# Patient Record
Sex: Female | Born: 1950
Health system: Southern US, Community
[De-identification: ages and names within clinical notes are randomized; demographics above are authoritative.]

## PROBLEM LIST (undated history)

## (undated) DIAGNOSIS — D369 Benign neoplasm, unspecified site: Secondary | ICD-10-CM

## (undated) DIAGNOSIS — I10 Essential (primary) hypertension: Secondary | ICD-10-CM

## (undated) DIAGNOSIS — R7303 Prediabetes: Secondary | ICD-10-CM

## (undated) DIAGNOSIS — E785 Hyperlipidemia, unspecified: Secondary | ICD-10-CM

## (undated) DIAGNOSIS — K5790 Diverticulosis of intestine, part unspecified, without perforation or abscess without bleeding: Secondary | ICD-10-CM

## (undated) DIAGNOSIS — F419 Anxiety disorder, unspecified: Secondary | ICD-10-CM

## (undated) DIAGNOSIS — M1990 Unspecified osteoarthritis, unspecified site: Secondary | ICD-10-CM

## (undated) DIAGNOSIS — M67432 Ganglion, left wrist: Secondary | ICD-10-CM

## (undated) DIAGNOSIS — H9319 Tinnitus, unspecified ear: Secondary | ICD-10-CM

## (undated) DIAGNOSIS — K219 Gastro-esophageal reflux disease without esophagitis: Secondary | ICD-10-CM

## (undated) DIAGNOSIS — T7840XA Allergy, unspecified, initial encounter: Secondary | ICD-10-CM

## (undated) HISTORY — PX: TONSILLECTOMY: SUR1361

## (undated) HISTORY — PX: OTHER SURGICAL HISTORY: SHX169

## (undated) HISTORY — DX: Anxiety disorder, unspecified: F41.9

## (undated) HISTORY — PX: COLONOSCOPY: SHX174

## (undated) HISTORY — DX: Ganglion, left wrist: M67.432

## (undated) HISTORY — DX: Unspecified osteoarthritis, unspecified site: M19.90

## (undated) HISTORY — DX: Benign neoplasm, unspecified site: D36.9

## (undated) HISTORY — PX: ABDOMINAL HYSTERECTOMY: SHX81

## (undated) HISTORY — PX: POLYPECTOMY: SHX149

## (undated) HISTORY — PX: TUBAL LIGATION: SHX77

## (undated) HISTORY — DX: Allergy, unspecified, initial encounter: T78.40XA

## (undated) HISTORY — DX: Tinnitus, unspecified ear: H93.19

## (undated) HISTORY — DX: Diverticulosis of intestine, part unspecified, without perforation or abscess without bleeding: K57.90

---

## 1999-11-02 ENCOUNTER — Other Ambulatory Visit: Admission: RE | Admit: 1999-11-02 | Discharge: 1999-11-02 | Payer: Self-pay | Admitting: Obstetrics and Gynecology

## 2000-07-13 ENCOUNTER — Encounter (INDEPENDENT_AMBULATORY_CARE_PROVIDER_SITE_OTHER): Payer: Self-pay | Admitting: Specialist

## 2000-07-13 ENCOUNTER — Other Ambulatory Visit: Admission: RE | Admit: 2000-07-13 | Discharge: 2000-07-13 | Payer: Self-pay | Admitting: Obstetrics and Gynecology

## 2001-03-06 ENCOUNTER — Other Ambulatory Visit: Admission: RE | Admit: 2001-03-06 | Discharge: 2001-03-06 | Payer: Self-pay | Admitting: Obstetrics and Gynecology

## 2002-05-15 ENCOUNTER — Other Ambulatory Visit: Admission: RE | Admit: 2002-05-15 | Discharge: 2002-05-15 | Payer: Self-pay | Admitting: Obstetrics and Gynecology

## 2003-05-19 ENCOUNTER — Other Ambulatory Visit: Admission: RE | Admit: 2003-05-19 | Discharge: 2003-05-19 | Payer: Self-pay | Admitting: Obstetrics and Gynecology

## 2004-06-28 ENCOUNTER — Other Ambulatory Visit: Admission: RE | Admit: 2004-06-28 | Discharge: 2004-06-28 | Payer: Self-pay | Admitting: Obstetrics and Gynecology

## 2007-09-28 ENCOUNTER — Ambulatory Visit: Payer: Self-pay | Admitting: Internal Medicine

## 2007-10-11 ENCOUNTER — Encounter: Payer: Self-pay | Admitting: Internal Medicine

## 2007-10-11 ENCOUNTER — Ambulatory Visit: Payer: Self-pay | Admitting: Internal Medicine

## 2009-11-17 ENCOUNTER — Encounter (INDEPENDENT_AMBULATORY_CARE_PROVIDER_SITE_OTHER): Payer: Self-pay | Admitting: *Deleted

## 2010-09-28 NOTE — Letter (Signed)
Summary: Colonoscopy Date Change Letter  [facility]  [address] [place], [place] [address]   Phone: [phone]  Fax: [phone]      November 17, 2009 MRN: [id number]   [facility] [address] [place], [place]  [address]   Dear Ms. ENCARNACION,   Previously you were recommended to have a repeat colonoscopy around this time. Your chart was recently reviewed by Dr. Hedwig Morton. Juanda Chance of [facility]. Follow up colonoscopy is now recommended in February 2016. This revised recommendation is based on current, nationally recognized guidelines for colorectal cancer screening and polyp surveillance. These guidelines are endorsed by the American Cancer Society, [employer] on Colorectal Cancer as well as numerous other major medical organizations.  Please understand that our recommendation assumes that you do not have any new symptoms such as bleeding, a change in bowel habits, anemia, or significant abdominal discomfort. If you do have any concerning GI symptoms or want to discuss the guideline recommendations, please call to arrange an office visit at your earliest convenience. Otherwise we will keep you in our reminder system and contact you 1-2 months prior to the date listed above to schedule your next colonoscopy.  Thank you,  Hedwig Morton. Juanda Chance, M.D.  [facility] [phone]

## 2012-11-29 ENCOUNTER — Emergency Department (HOSPITAL_COMMUNITY)
Admission: EM | Admit: 2012-11-29 | Discharge: 2012-11-29 | Disposition: A | Discharge status: Home / Self Care | Payer: BC Managed Care – PPO | Attending: Emergency Medicine | Admitting: Emergency Medicine

## 2012-11-29 ENCOUNTER — Encounter (HOSPITAL_COMMUNITY): Payer: Self-pay | Admitting: Emergency Medicine

## 2012-11-29 DIAGNOSIS — I10 Essential (primary) hypertension: Secondary | ICD-10-CM | POA: Insufficient documentation

## 2012-11-29 DIAGNOSIS — E871 Hypo-osmolality and hyponatremia: Secondary | ICD-10-CM | POA: Insufficient documentation

## 2012-11-29 DIAGNOSIS — Z79899 Other long term (current) drug therapy: Secondary | ICD-10-CM | POA: Insufficient documentation

## 2012-11-29 DIAGNOSIS — E785 Hyperlipidemia, unspecified: Secondary | ICD-10-CM | POA: Insufficient documentation

## 2012-11-29 HISTORY — DX: Essential (primary) hypertension: I10

## 2012-11-29 HISTORY — DX: Hyperlipidemia, unspecified: E78.5

## 2012-11-29 LAB — COMPREHENSIVE METABOLIC PANEL
ALT: 20 U/L (ref 0–35)
Albumin: 3.7 g/dL (ref 3.5–5.2)
Alkaline Phosphatase: 51 U/L (ref 39–117)
Calcium: 9.3 mg/dL (ref 8.4–10.5)
GFR calc Af Amer: >90 mL/min (ref >90)
Potassium: 3.4 mEq/L — ABNORMAL LOW (ref 3.5–5.1)
Sodium: 125 mEq/L — ABNORMAL LOW (ref 135–145)
Total Protein: 6.7 g/dL (ref 6.0–8.3)

## 2012-11-29 LAB — CBC WITH DIFFERENTIAL/PLATELET
Basophils Relative: 1 % (ref 0–1)
Eosinophils Absolute: 0.1 10*3/uL (ref 0.0–0.7)
Eosinophils Relative: 1 % (ref 0–5)
MCH: 30.3 pg (ref 26.0–34.0)
MCHC: 35.7 g/dL (ref 30.0–36.0)
MCV: 85.1 fL (ref 78.0–100.0)
Neutrophils Relative %: 69 % (ref 43–77)
Platelets: 267 10*3/uL (ref 150–400)
RDW: 12.1 % (ref 11.5–15.5)

## 2012-11-29 MED ORDER — SODIUM CHLORIDE 0.9 % IV BOLUS (SEPSIS)
1000.0000 mL | Freq: Once | INTRAVENOUS | Status: AC
  Administered 2012-11-29: 1000 mL via INTRAVENOUS

## 2012-11-29 NOTE — ED Provider Notes (Signed)
History     CSN: [id number]  Arrival date & time 11/29/12  1357   First MD Initiated Contact with Patient 11/29/12 1408      Chief Complaint  Patient presents with  . abnormal labs    (Consider location/radiation/quality/duration/timing/severity/associated sxs/prior treatment) HPI Comments: Pt comes in with cc of abnl labs. Pt has hx of HTN. States that her PCP called and requested her to come to the ER for abnormal sodium. Pt has no AMS/confusion, weakness, seizures. She denies any hx of endocrine abnormality, increased water intake. She is on HCTZ.  The history is provided by the patient.    Past Medical History  Diagnosis Date  . Hypertension   . Hyperlipidemia     History reviewed. No pertinent past surgical history.  No family history on file.  History  Substance Use Topics  . Smoking status: Never Smoker   . Smokeless tobacco: Not on file  . Alcohol Use: No    OB History   Grav Para Term Preterm Abortions TAB SAB Ect Mult Living                  Review of Systems  Constitutional: Negative for activity change.  HENT: Negative for neck pain.   Respiratory: Negative for shortness of breath.   Cardiovascular: Negative for chest pain.  Gastrointestinal: Negative for nausea, vomiting and abdominal pain.  Genitourinary: Negative for dysuria.  Neurological: Negative for headaches.    Allergies  Review of patient's allergies indicates no known allergies.  Home Medications   Current Outpatient Rx  Name  Route  Sig  Dispense  Refill  . calcium-vitamin D (OSCAL WITH D) 500-200 MG-UNIT per tablet   Oral   Take 1 tablet by mouth daily.         . fish oil-omega-3 fatty acids 1000 MG capsule   Oral   Take 2 g by mouth daily.         . hydrochlorothiazide (HYDRODIURIL) 25 MG tablet   Oral   Take 25 mg by mouth every morning.         Marland Kitchen ibuprofen (ADVIL,MOTRIN) 200 MG tablet   Oral   Take 200-400 mg by mouth every 6 (six) hours as needed for pain.         Marland Kitchen lisinopril (PRINIVIL,ZESTRIL) 10 MG tablet   Oral   Take 5 mg by mouth every morning. Takes 1/2         . Melatonin 3 MG TABS   Oral   Take 1 tablet by mouth at bedtime as needed (sleep).         . Multiple Vitamin (MULTIVITAMIN WITH MINERALS) TABS   Oral   Take 1 tablet by mouth daily.         . pseudoephedrine (SUDAFED) 60 MG tablet   Oral   Take 60 mg by mouth every 4 (four) hours as needed for congestion.         . rosuvastatin (CRESTOR) 10 MG tablet   Oral   Take 10 mg by mouth every evening.         . Vitamins-Lipotropics (LIPOFLAVONOID PO)   Oral   Take 1 tablet by mouth 2 (two) times daily.           BP 151/81  Pulse 94  Temp(Src) 98.7 F (37.1 C) (Oral)  Resp 20  SpO2 97%  Physical Exam  Nursing note and vitals reviewed. Constitutional: She is oriented to person, place, and time. She appears well-developed.  HENT:  Head: Normocephalic and atraumatic.  Eyes: Conjunctivae and EOM are normal. Pupils are equal, round, and reactive to light.  Neck: Normal range of motion. Neck supple.  Cardiovascular: Normal rate, regular rhythm, normal heart sounds and intact distal pulses.   No murmur heard. Pulmonary/Chest: Effort normal. No respiratory distress. She has no wheezes.  Abdominal: Soft. Bowel sounds are normal. She exhibits no distension. There is no tenderness. There is no rebound and no guarding.  Neurological: She is alert and oriented to person, place, and time.  Skin: Skin is warm and dry.    ED Course  Procedures (including critical care time)  Labs Reviewed  CBC WITH DIFFERENTIAL  COMPREHENSIVE METABOLIC PANEL   No results found.   No diagnosis found.    MDM  Pt comes in with cc of hyponatremia. Pt is asymptomatic. Pt is on HCTZ - otherwise, there is no etiology that i can undercover on hx and exam alone. Will get CBC, BNP. Hydrate.   Derwood Kaplan, MD 11/29/12 1514

## 2012-11-29 NOTE — ED Notes (Signed)
Pt presenting to ed with c/o abnormal labs sent by her pcp for low sodium level pt with no complaints at this time

## 2013-03-02 ENCOUNTER — Ambulatory Visit (INDEPENDENT_AMBULATORY_CARE_PROVIDER_SITE_OTHER): Payer: BC Managed Care – PPO | Admitting: Family Medicine

## 2013-03-02 VITALS — BP 140/82 | HR 93 | Temp 97.8°F | Resp 18 | Ht 63.0 in | Wt 135.0 lb

## 2013-03-02 DIAGNOSIS — N760 Acute vaginitis: Secondary | ICD-10-CM

## 2013-03-02 DIAGNOSIS — B3731 Acute candidiasis of vulva and vagina: Secondary | ICD-10-CM

## 2013-03-02 DIAGNOSIS — B373 Candidiasis of vulva and vagina: Secondary | ICD-10-CM

## 2013-03-02 LAB — POCT WET PREP WITH KOH
Clue Cells Wet Prep HPF POC: NEGATIVE
Trichomonas, UA: NEGATIVE

## 2013-03-02 MED ORDER — FLUCONAZOLE 150 MG PO TABS: 150.0000 mg | ORAL_TABLET | Freq: Once | ORAL | Status: DC

## 2013-03-02 NOTE — Progress Notes (Signed)
Subjective: Patient is here with a possible yeast infection. She last had intercourse about 5 days ago. The last few days she's had vaginal burning sensation. Not much discharge. It is different from other yeast infection she is head. She has used some cream that she had for burning sensation which has an antibiotic in it. She has not used any Gynelotrimin or such medication.  She does wear a pessary for prolapse. Gets a checkup for this every 6 to months.  Objective: No CVA tenderness. Abdomen soft without masses or tenderness. Normal external genitalia except for a little erythema between the labia and introitus. The speculum exam does not show any discharge. She has some slightly whitish plaque areas on the wall of the vagina. Wet prep was taken with special attention to these. Bimanual exam is normal. She has the pessary is noted, and it seems to be well in place.  Assessment: Vaginal irritation, etiology undetermined  Plan: Wet prep  Results for orders placed in visit on 03/02/13  POCT WET PREP WITH KOH      Result Value Range   Trichomonas, UA Negative     Clue Cells Wet Prep HPF POC neg     Epithelial Wet Prep HPF POC 4-8     Yeast Wet Prep HPF POC positive     Bacteria Wet Prep HPF POC 1+     RBC Wet Prep HPF POC 0-1     WBC Wet Prep HPF POC 10-15     KOH Prep POC Positive     rx diflucan

## 2013-03-02 NOTE — Patient Instructions (Signed)
Monilial Vaginitis  Vaginitis in a soreness, swelling and redness (inflammation) of the vagina and vulva. Monilial vaginitis is not a sexually transmitted infection.  CAUSES   Yeast vaginitis is caused by yeast (candida) that is normally found in your vagina. With a yeast infection, the candida has overgrown in number to a point that upsets the chemical balance.  SYMPTOMS   · White, thick vaginal discharge.  · Swelling, itching, redness and irritation of the vagina and possibly the lips of the vagina (vulva).  · Burning or painful urination.  · Painful intercourse.  DIAGNOSIS   Things that may contribute to monilial vaginitis are:  · Postmenopausal and virginal states.  · Pregnancy.  · Infections.  · Being tired, sick or stressed, especially if you had monilial vaginitis in the past.  · Diabetes. Good control will help lower the chance.  · Birth control pills.  · Tight fitting garments.  · Using bubble bath, feminine sprays, douches or deodorant tampons.  · Taking certain medications that kill germs (antibiotics).  · Sporadic recurrence can occur if you become ill.  TREATMENT   Your caregiver will give you medication.  · There are several kinds of anti monilial vaginal creams and suppositories specific for monilial vaginitis. For recurrent yeast infections, use a suppository or cream in the vagina 2 times a week, or as directed.  · Anti-monilial or steroid cream for the itching or irritation of the vulva may also be used. Get your caregiver's permission.  · Painting the vagina with methylene blue solution may help if the monilial cream does not work.  · Eating yogurt may help prevent monilial vaginitis.  HOME CARE INSTRUCTIONS   · Finish all medication as prescribed.  · Do not have sex until treatment is completed or after your caregiver tells you it is okay.  · Take warm sitz baths.  · Do not douche.  · Do not use tampons, especially scented ones.  · Wear cotton underwear.  · Avoid tight pants and panty  hose.  · Tell your sexual partner that you have a yeast infection. They should go to their caregiver if they have symptoms such as mild rash or itching.  · Your sexual partner should be treated as well if your infection is difficult to eliminate.  · Practice safer sex. Use condoms.  · Some vaginal medications cause latex condoms to fail. Vaginal medications that harm condoms are:  · Cleocin cream.  · Butoconazole (Femstat®).  · Terconazole (Terazol®) vaginal suppository.  · Miconazole (Monistat®) (may be purchased over the counter).  SEEK MEDICAL CARE IF:   · You have a temperature by mouth above 102° F (38.9° C).  · The infection is getting worse after 2 days of treatment.  · The infection is not getting better after 3 days of treatment.  · You develop blisters in or around your vagina.  · You develop vaginal bleeding, and it is not your menstrual period.  · You have pain when you urinate.  · You develop intestinal problems.  · You have pain with sexual intercourse.  Document Released: 05/25/2005 Document Revised: 11/07/2011 Document Reviewed: 02/06/2009  Exit[employer]® Patient Information ©2014 [employer].

## 2013-03-20 ENCOUNTER — Telehealth: Payer: Self-pay | Admitting: Internal Medicine

## 2013-03-20 NOTE — Telephone Encounter (Signed)
Patient started having diarrhea 3 weeks ago. She saw her PCP and was given medication. The diarrhea got better but has returned. Her PCP recommended she see her GI. Scheduled with Mike Gip, PA on 03/21/13 at 8:30 AM.

## 2013-03-21 ENCOUNTER — Other Ambulatory Visit (INDEPENDENT_AMBULATORY_CARE_PROVIDER_SITE_OTHER): Payer: BC Managed Care – PPO

## 2013-03-21 ENCOUNTER — Encounter: Payer: Self-pay | Admitting: Physician Assistant

## 2013-03-21 ENCOUNTER — Ambulatory Visit (INDEPENDENT_AMBULATORY_CARE_PROVIDER_SITE_OTHER): Payer: BC Managed Care – PPO | Admitting: Physician Assistant

## 2013-03-21 VITALS — Ht 62.0 in | Wt 136.1 lb

## 2013-03-21 DIAGNOSIS — R197 Diarrhea, unspecified: Secondary | ICD-10-CM

## 2013-03-21 DIAGNOSIS — I10 Essential (primary) hypertension: Secondary | ICD-10-CM | POA: Insufficient documentation

## 2013-03-21 DIAGNOSIS — E785 Hyperlipidemia, unspecified: Secondary | ICD-10-CM | POA: Insufficient documentation

## 2013-03-21 DIAGNOSIS — R634 Abnormal weight loss: Secondary | ICD-10-CM

## 2013-03-21 LAB — CBC WITH DIFFERENTIAL/PLATELET
Basophils Absolute: 0 10*3/uL (ref 0.0–0.1)
HCT: 38.2 % (ref 36.0–46.0)
Lymphocytes Relative: 25 % (ref 12.0–46.0)
Monocytes Relative: 9 % (ref 3.0–12.0)
Neutro Abs: 2.8 10*3/uL (ref 1.4–7.7)
Neutrophils Relative %: 61.1 % (ref 43.0–77.0)
RBC: 4.24 Mil/uL (ref 3.87–5.11)
RDW: 13.2 % (ref 11.5–14.6)
WBC: 4.6 10*3/uL (ref 4.5–10.5)

## 2013-03-21 LAB — HIGH SENSITIVITY CRP: CRP, High Sensitivity: 0.41 mg/L (ref 0.000–5.000)

## 2013-03-21 MED ORDER — METRONIDAZOLE 250 MG PO TABS: 250.0000 mg | ORAL_TABLET | Freq: Three times a day (TID) | ORAL | Status: DC

## 2013-03-21 MED ORDER — SACCHAROMYCES BOULARDII 250 MG PO CAPS: 250.0000 mg | ORAL_CAPSULE | Freq: Two times a day (BID) | ORAL | Status: DC

## 2013-03-21 NOTE — Progress Notes (Signed)
Reviewed and totally agree.

## 2013-03-21 NOTE — Patient Instructions (Addendum)
Please go to the basement level to have your labs drawn.  We will call you with the results. We sent prescriptions to [facility], [facility] for Florastor tablets and Flagyl ( Metronidazole ) .

## 2013-03-21 NOTE — Progress Notes (Signed)
Subjective:    Patient ID: Amanda Curry, female    DOB: [birth year], 62 y.o.   MRN: [id number]  HPI  Amanda Curry is a pleasant 63 year old white female known to Dr. Lina Curry from prior colonoscopy done in 2009. She was found to have moderate diverticulosis in the descending colon and two, 2 mm polyps were removed from the right colon. Path on these showed benign colonic mucosa. Patient comes in today for new complaint of diarrhea and says that she has had symptoms over the past 3 weeks. Initially she had some abdominal pain and gas but says that has resolved Amanda Curry she was seen by primary care and given a prescription for Lomotil which she is now taking 3 or 4 times daily. She says that is controlling her symptoms but she still having at least 3 or 4 bowel movements every morning which are loose to liquid. Sh e says initially her stools were very watery, she's never and seen any blood though first day the stool was very dark. She's not had any associated fever or chills. No nausea or vomiting, her appetite has been fine. Her weight is down a couple of pounds.  She's not been on any recent antibiotics and no new medicines prior to the onset of her symptoms. She does not had any recent foreign travel camping etc. She says she feels fine and has not noticed any fatigue malaise etc.    Review of Systems  Constitutional: Positive for unexpected weight change.  HENT: Negative.   Eyes: Negative.   Respiratory: Negative.   Cardiovascular: Negative.   Gastrointestinal: Positive for diarrhea.  Endocrine: Negative.   Genitourinary: Negative.   Musculoskeletal: Negative.   Skin: Negative.   Allergic/Immunologic: Negative.   Neurological: Negative.   Hematological: Negative.   Psychiatric/Behavioral: Negative.    Outpatient Prescriptions Prior to Visit  Medication Sig Dispense Refill  . calcium-vitamin D (OSCAL WITH D) 500-200 MG-UNIT per tablet Take 1 tablet by mouth daily.      . fish oil-omega-3 fatty  acids 1000 MG capsule Take 2 g by mouth daily.      Marland Kitchen ibuprofen (ADVIL,MOTRIN) 200 MG tablet Take 200-400 mg by mouth every 6 (six) hours as needed for pain.      Marland Kitchen lisinopril (PRINIVIL,ZESTRIL) 10 MG tablet Take 5 mg by mouth every morning. Takes 1/2      . Melatonin 3 MG TABS Take 1 tablet by mouth at bedtime as needed (sleep).      . Multiple Vitamin (MULTIVITAMIN WITH MINERALS) TABS Take 1 tablet by mouth daily.      . pseudoephedrine (SUDAFED) 60 MG tablet Take 60 mg by mouth every 4 (four) hours as needed for congestion.      . Vitamins-Lipotropics (LIPOFLAVONOID PO) Take 1 tablet by mouth 2 (two) times daily.      . fluconazole (DIFLUCAN) 150 MG tablet Take 1 tablet (150 mg total) by mouth once.  1 tablet  1  . hydrochlorothiazide (HYDRODIURIL) 25 MG tablet Take 25 mg by mouth every morning.      . rosuvastatin (CRESTOR) 10 MG tablet Take 10 mg by mouth every evening.       No facility-administered medications prior to visit.   No Known Allergies Patient Active Problem List   Diagnosis Date Noted  . HTN (hypertension) 03/21/2013  . Other and unspecified hyperlipidemia 03/21/2013   History  Substance Use Topics  . Smoking status: Never Smoker   . Smokeless tobacco: Never Used  . Alcohol Use:  Yes     Comment: rarely   family history includes Alzheimer's disease in her mother; Colon cancer in her maternal grandmother; Hypertension in her sister; and Prostate cancer in her father.     Objective:   Physical Exam Well-developed white female in no acute distress, pleasant. HEENT; nontraumatic normocephalic EOMI PERRLA sclera anicteric, Supple no JVD, Cardiovascular; regular rate and rhythm with S1-S2 no murmur or gallop, Pulmonary clear bilaterally,  .Abdomen; soft nontender no palpable mass or hepatosplenomegaly bowel sounds are somewhat hyperactive, Rectal ;exam not done, Extremities; no clubbing cyanosis or edema skin warm and dry, Psych; mood and affect normal and  appropriate       Assessment & Plan:  #6 62 year old female with 3 week history of persistent diarrhea. She has lost a couple of pounds but does not have any other constitutional symptoms. Suspect infectious etiology though need to consider IBD or microscopic colitis.  #2 diverticulosis  Plan; check CBC with differential and CRP today GI pathogen panel Start empiric course of Flagyl 250 mg 3 times daily x10 days Start Florastor  one by mouth twice daily x2 weeks She may continue to use Lomotil as needed Further workup will be to be dependent on  response to the Flagyl and results of above labs etc.

## 2013-03-22 LAB — GASTROINTESTINAL PATHOGEN PANEL PCR
C. difficile Tox A/B, PCR: NEGATIVE
Campylobacter, PCR: NEGATIVE
Cryptosporidium, PCR: NEGATIVE
E coli 0157, PCR: NEGATIVE
Giardia lamblia, PCR: NEGATIVE
Salmonella, PCR: NEGATIVE

## 2013-06-19 ENCOUNTER — Ambulatory Visit: Payer: BC Managed Care – PPO | Admitting: Family Medicine

## 2013-06-19 VITALS — BP 164/98 | HR 88 | Temp 98.8°F | Resp 18 | Wt 135.0 lb

## 2013-06-19 DIAGNOSIS — Z23 Encounter for immunization: Secondary | ICD-10-CM

## 2013-06-19 DIAGNOSIS — I10 Essential (primary) hypertension: Secondary | ICD-10-CM

## 2013-06-19 DIAGNOSIS — E785 Hyperlipidemia, unspecified: Secondary | ICD-10-CM

## 2013-06-19 LAB — LIPID PANEL
Cholesterol: 187 mg/dL (ref 0–200)
HDL: 49 mg/dL (ref >39)
LDL Cholesterol: 95 mg/dL (ref 0–99)
Total CHOL/HDL Ratio: 3.8 Ratio
Triglycerides: 215 mg/dL — ABNORMAL HIGH (ref <150)
VLDL: 43 mg/dL — ABNORMAL HIGH (ref 0–40)

## 2013-06-19 LAB — COMPREHENSIVE METABOLIC PANEL
ALT: 16 U/L (ref 0–35)
AST: 21 U/L (ref 0–37)
Albumin: 4.1 g/dL (ref 3.5–5.2)
Alkaline Phosphatase: 58 U/L (ref 39–117)
BUN: 14 mg/dL (ref 6–23)
CO2: 31 mEq/L (ref 19–32)
Calcium: 9.8 mg/dL (ref 8.4–10.5)
Chloride: 97 mEq/L (ref 96–112)
Creat: 0.7 mg/dL (ref 0.50–1.10)
Glucose, Bld: 96 mg/dL (ref 70–99)
Potassium: 4.8 mEq/L (ref 3.5–5.3)
Sodium: 134 mEq/L — ABNORMAL LOW (ref 135–145)
Total Bilirubin: 0.4 mg/dL (ref 0.3–1.2)
Total Protein: 6.8 g/dL (ref 6.0–8.3)

## 2013-06-19 NOTE — Addendum Note (Signed)
Addended by: Honor Loh on: 06/19/2013 08:31 AM   Modules accepted: Orders

## 2013-06-19 NOTE — Patient Instructions (Signed)
Continue to monitor your blood pressure.  Our goal is less than 140 over 90 The results should be back on your cholesterol test in 3-4 days

## 2013-06-19 NOTE — Progress Notes (Signed)
Patient ID: Amanda Curry, female   DOB: [birth year], 62 y.o.   MRN: [id number]  @[facility]CLOGO @  Patient ID: Amanda Curry MRN: [id number], DOB: 1951-06-20, 62 y.o. Date of Encounter: 06/19/2013, 8:04 AM This chart was scribed for Amanda Sidle, MD by Valera Castle, ED Scribe. This patient was seen in room 13 and the patient's care was started at 8:04 AM.   Primary Physician: Rodney Booze  Chief Complaint: Cholesterol Check  HPI: 62 y.o. year old female with history below presents requesting a cholesterol check. She reports she has been taking 1 mg of Lisinopril. She reports associated burning sensation in her feet from the medication. She reports being on cholesterol medicine about 5-6 years ago and that this is her 4th try with a different cholesterol medication. She reports taking her Lisinopril PTA.  She denies heart disease running in the family. She reports both her parents have passed away. She states her mother passed of Alzheimer's and her father from prostate cancer.   She is here to receive a flu vaccine as well.   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   Recheck BP 164/90  Past Medical History  Diagnosis Date  . Hypertension   . Hyperlipidemia   . Arthritis   . Ganglion of left wrist     thumb joint     Home Meds: Prior to Admission medications   Medication Sig Start Date End Date Taking? Authorizing Provider  calcium-vitamin D (OSCAL WITH D) 500-200 MG-UNIT per tablet Take 1 tablet by mouth daily.   Yes Historical Provider, MD  fish oil-omega-3 fatty acids 1000 MG capsule Take 2 g by mouth daily.   Yes Historical Provider, MD  lisinopril (PRINIVIL,ZESTRIL) 10 MG tablet Take 5 mg by mouth every morning. Takes 1/2   Yes Historical Provider, MD  Melatonin 3 MG TABS Take 1 tablet by mouth at bedtime as needed (sleep).   Yes Historical Provider, MD  Multiple Vitamin (MULTIVITAMIN WITH MINERALS) TABS Take 1  tablet by mouth daily.   Yes Historical Provider, MD  Pitavastatin Calcium (LIVALO) 2 MG TABS Take 1 tablet by mouth daily.    Yes Historical Provider, MD  pseudoephedrine (SUDAFED) 60 MG tablet Take 60 mg by mouth every 4 (four) hours as needed for congestion.   Yes Historical Provider, MD  Vitamins-Lipotropics (LIPOFLAVONOID PO) Take 1 tablet by mouth 2 (two) times daily.   Yes Historical Provider, MD    Allergies: No Known Allergies  History   Social History  . Marital Status: Married    Spouse Name: N/A    Number of Children: 4  . Years of Education: N/A   Occupational History  . teacher    Social History Main Topics  . Smoking status: Never Smoker   . Smokeless tobacco: Never Used  . Alcohol Use: Yes     Comment: rarely  . Drug Use: No  . Sexual Activity: Yes   Other Topics Concern  . Not on file   Social History Narrative  . No narrative on file     Review of Systems: Constitutional: negative for chills, fever, night sweats, weight changes, or fatigue  HEENT: negative for vision changes, hearing loss, congestion, rhinorrhea, ST, epistaxis, or sinus pressure Cardiovascular: negative for chest pain or palpitations Respiratory: negative for hemoptysis, wheezing, shortness of breath, or cough Abdominal: negative for abdominal pain, nausea, vomiting, diarrhea, or constipation Dermatological: negative for rash Neurologic: negative for headache, dizziness, or syncope Positive  for burning sensation in bilateral feet All other systems reviewed and are otherwise negative with the exception to those above and in the HPI.   Physical Exam: Blood pressure 164/98, pulse 88, temperature 98.8 F (37.1 C), temperature source Oral, resp. rate 18, weight 135 lb (61.236 kg), SpO2 98.00%., Body mass index is 24.69 kg/(m^2). General: Well developed, well nourished, in no acute distress. Head: Normocephalic, atraumatic, eyes without discharge, sclera non-icteric, nares are without  discharge. Bilateral auditory canals clear, TM's are without perforation, pearly grey and translucent with reflective cone of light bilaterally. Oral cavity moist, posterior pharynx without exudate, erythema, peritonsillar abscess, or post nasal drip.  Neck: Supple. No thyromegaly. Full ROM. No lymphadenopathy. Lungs: Clear bilaterally to auscultation without wheezes, rales, or rhonchi. Breathing is unlabored. Heart: RRR with S1 S2. No murmurs, rubs, or gallops appreciated. Abdomen: Soft, non-tender, non-distended with normoactive bowel sounds. No hepatomegaly. No rebound/guarding. No obvious abdominal masses. Msk:  Strength and tone normal for age. Extremities/Skin: Warm and dry. No clubbing or cyanosis. No edema. No rashes or suspicious lesions. Neuro: Alert and oriented X 3. Moves all extremities spontaneously. Gait is normal. CNII-XII grossly in tact. Psych:  Responds to questions appropriately with a normal affect.    Labs:  ASSESSMENT AND PLAN:  62 y.o. year old female with hypertension and hyperlipidemia Hyperlipidemia - Plan: Lipid panel, Comprehensive metabolic panel  Hypertension monitor the BP and return if several consecutive BP's remain elevated.    Signed, Amanda Sidle, MD 06/19/2013 8:04 AM

## 2013-06-21 ENCOUNTER — Other Ambulatory Visit: Payer: Self-pay

## 2013-06-21 MED ORDER — PITAVASTATIN CALCIUM 2 MG PO TABS: 1.0000 | ORAL_TABLET | Freq: Every day | ORAL | Status: DC

## 2013-08-20 ENCOUNTER — Ambulatory Visit (INDEPENDENT_AMBULATORY_CARE_PROVIDER_SITE_OTHER): Payer: BC Managed Care – PPO | Admitting: Physician Assistant

## 2013-08-20 VITALS — BP 128/82 | HR 78 | Temp 99.1°F | Resp 16 | Ht 62.0 in | Wt 138.2 lb

## 2013-08-20 DIAGNOSIS — I10 Essential (primary) hypertension: Secondary | ICD-10-CM

## 2013-08-20 LAB — BASIC METABOLIC PANEL
CO2: 27 mEq/L (ref 19–32)
Glucose, Bld: 94 mg/dL (ref 70–99)
Potassium: 4.2 mEq/L (ref 3.5–5.3)
Sodium: 133 mEq/L — ABNORMAL LOW (ref 135–145)

## 2013-08-20 MED ORDER — LISINOPRIL 10 MG PO TABS: 10.0000 mg | ORAL_TABLET | Freq: Every day | ORAL | Status: DC

## 2013-08-20 NOTE — Patient Instructions (Signed)
Continue taking lisinopril 10mg  daily.  Consider trying an allergy medicine like Claritin, Zyrtec, or Allegra instead of the Sudafed (which can make your blood pressure go up.)  I will let you know when your labs are back and if we need to make any changes based on that  Have a great holiday!  Hypertension As your heart beats, it forces blood through your arteries. This force is your blood pressure. If the pressure is too high, it is called hypertension (HTN) or high blood pressure. HTN is dangerous because you may have it and not know it. High blood pressure may mean that your heart has to work harder to pump blood. Your arteries may be narrow or stiff. The extra work puts you at risk for heart disease, stroke, and other problems.  Blood pressure consists of two numbers, a higher number over a lower, 110/72, for example. It is stated as "110 over 72." The ideal is below 120 for the top number (systolic) and under 80 for the bottom (diastolic). Write down your blood pressure today. You should pay close attention to your blood pressure if you have certain conditions such as:  Heart failure.  Prior heart attack.  Diabetes  Chronic kidney disease.  Prior stroke.  Multiple risk factors for heart disease. To see if you have HTN, your blood pressure should be measured while you are seated with your arm held at the level of the heart. It should be measured at least twice. A one-time elevated blood pressure reading (especially in the Emergency Department) does not mean that you need treatment. There may be conditions in which the blood pressure is different between your right and left arms. It is important to see your caregiver soon for a recheck. Most people have essential hypertension which means that there is not a specific cause. This type of high blood pressure may be lowered by changing lifestyle factors such as:  Stress.  Smoking.  Lack of exercise.  Excessive  weight.  Drug/tobacco/alcohol use.  Eating less salt. Most people do not have symptoms from high blood pressure until it has caused damage to the body. Effective treatment can often prevent, delay or reduce that damage. TREATMENT  When a cause has been identified, treatment for high blood pressure is directed at the cause. There are a large number of medications to treat HTN. These fall into several categories, and your caregiver will help you select the medicines that are best for you. Medications may have side effects. You should review side effects with your caregiver. If your blood pressure stays high after you have made lifestyle changes or started on medicines,   Your medication(s) may need to be changed.  Other problems may need to be addressed.  Be certain you understand your prescriptions, and know how and when to take your medicine.  Be sure to follow up with your caregiver within the time frame advised (usually within two weeks) to have your blood pressure rechecked and to review your medications.  If you are taking more than one medicine to lower your blood pressure, make sure you know how and at what times they should be taken. Taking two medicines at the same time can result in blood pressure that is too low. SEEK IMMEDIATE MEDICAL CARE IF:  You develop a severe headache, blurred or changing vision, or confusion.  You have unusual weakness or numbness, or a faint feeling.  You have severe chest or abdominal pain, vomiting, or breathing problems. MAKE SURE YOU:  Understand these instructions.  Will watch your condition.  Will get help right away if you are not doing well or get worse. Document Released: 08/15/2005 Document Revised: 11/07/2011 Document Reviewed: 04/04/2008 [facility] Patient Information 2014 [place].

## 2013-08-20 NOTE — Progress Notes (Signed)
   Subjective:    Patient ID: Amanda Curry, female    DOB: [birth year], 62 y.o.   MRN: [id number]  HPI   Amanda Curry is a very pleasant 62 yr old female here for refill on BP medication.  Takes lisinopril 10mg  daily.  Has been on this medication a little over 6 months now.  Controlling BP well.  Used to check home BPs but does not anymore has they have been normal with medication use.  No SEs from the medication.    Previously had hyponatremia with hctz which prompted switch to lisinopril  Review of Systems  Constitutional: Negative.   Respiratory: Negative for cough, shortness of breath and wheezing.   Cardiovascular: Negative for chest pain, palpitations and leg swelling.  Gastrointestinal: Negative.   Musculoskeletal: Negative.   Skin: Negative.        Objective:   Physical Exam  Vitals reviewed. Constitutional: She is oriented to person, place, and time. She appears well-developed and well-nourished. No distress.  HENT:  Head: Normocephalic and atraumatic.  Eyes: Conjunctivae are normal. No scleral icterus.  Cardiovascular: Normal rate, regular rhythm, normal heart sounds and intact distal pulses.   Pulmonary/Chest: Effort normal and breath sounds normal. She has no wheezes. She has no rales.  Musculoskeletal: She exhibits no edema.  Neurological: She is alert and oriented to person, place, and time.  Skin: Skin is warm and dry.  Psychiatric: She has a normal mood and affect. Her behavior is normal.        Assessment & Plan:  Hypertension - Plan: lisinopril (PRINIVIL,ZESTRIL) 10 MG tablet, Basic metabolic panel   Amanda Curry is a very pleasant 62 yr old female here for refill of HTN medication.  BP has been well controlled on 10mg  lisinopril without side effects.  Lisinopril refilled today.  History of hypoNA+.  BMP pending - will adjust therapy if necessary based on this.  Pt to follow up in about 6 months, sooner if concerns arise   Meds ordered this encounter  Medications    . lisinopril (PRINIVIL,ZESTRIL) 10 MG tablet    Sig: Take 1 tablet (10 mg total) by mouth daily.    Dispense:  90 tablet    Refill:  1    Order Specific Question:  Supervising Provider    Answer:  Nilda Simmer M [[id number]]    Amanda Dicker MHS, PA-C [facility] 12/23/20149:36 AM

## 2013-11-02 ENCOUNTER — Ambulatory Visit (INDEPENDENT_AMBULATORY_CARE_PROVIDER_SITE_OTHER): Payer: BC Managed Care – PPO | Admitting: Family Medicine

## 2013-11-02 ENCOUNTER — Encounter: Payer: Self-pay | Admitting: Family Medicine

## 2013-11-02 VITALS — BP 130/90 | HR 86 | Temp 98.1°F | Resp 16 | Ht 62.5 in | Wt 137.0 lb

## 2013-11-02 DIAGNOSIS — Z23 Encounter for immunization: Secondary | ICD-10-CM

## 2013-11-02 DIAGNOSIS — I10 Essential (primary) hypertension: Secondary | ICD-10-CM

## 2013-11-02 DIAGNOSIS — E871 Hypo-osmolality and hyponatremia: Secondary | ICD-10-CM | POA: Diagnosis not present

## 2013-11-02 LAB — BASIC METABOLIC PANEL
BUN: 11 mg/dL (ref 6–23)
CO2: 28 mEq/L (ref 19–32)
Calcium: 9.3 mg/dL (ref 8.4–10.5)
Chloride: 99 mEq/L (ref 96–112)
Creat: 0.66 mg/dL (ref 0.50–1.10)
Glucose, Bld: 78 mg/dL (ref 70–99)
POTASSIUM: 4.6 meq/L (ref 3.5–5.3)
Sodium: 134 mEq/L — ABNORMAL LOW (ref 135–145)

## 2013-11-02 MED ORDER — LISINOPRIL 10 MG PO TABS: 10.0000 mg | ORAL_TABLET | Freq: Every day | ORAL | Status: DC

## 2013-11-02 NOTE — Patient Instructions (Signed)
Good to see you today- please set up your mychart account for easy communication.  I will be in touch with your labs. You are also eligible for a shingles vaccine if you have not had one yet- this is available at [employer] [facility].  It is a one time shot.  Use the written  rx that I gave you today

## 2013-11-02 NOTE — Progress Notes (Signed)
[facility] [address] [place] [address] [phone]  Date:  11/02/2013   Name:  Amanda Curry   DOB:  10-Oct-1950   MRN:  [id number]  PCP:  Ted Mcalpine    Chief Complaint: Medication Refill   History of Present Illness:  Amanda Curry is a 63 y.o. very pleasant female patient who presents with the following:  She is here today for a BP medication refill and electrolyte refill.  He had been on a diuretic in the past but had to stop due to hyponatremia.  She is on lisinopril 10mg    She has stopped her livalo for a couple of weeks to see if it is causing her feet to burn.    She just turned 63 and is now due for a pneumovax. She would like to go ahead and do this today.   Patient Active Problem List   Diagnosis Date Noted  . HTN (hypertension) 03/21/2013  . Other and unspecified hyperlipidemia 03/21/2013    Past Medical History  Diagnosis Date  . Hypertension   . Hyperlipidemia   . Arthritis   . Ganglion of left wrist     thumb joint    Past Surgical History  Procedure Laterality Date  . Tubal ligation    . Tonsillectomy      History  Substance Use Topics  . Smoking status: Never Smoker   . Smokeless tobacco: Never Used  . Alcohol Use: Yes     Comment: rarely    Family History  Problem Relation Age of Onset  . Colon cancer Maternal Grandmother   . Prostate cancer Father   . Alzheimer's disease Mother   . Hypertension Sister     No Known Allergies  Medication list has been reviewed and updated.  Current Outpatient Prescriptions on File Prior to Visit  Medication Sig Dispense Refill  . calcium-vitamin D (OSCAL WITH D) 500-200 MG-UNIT per tablet Take 1 tablet by mouth daily.      . fish oil-omega-3 fatty acids 1000 MG capsule Take 2 g by mouth daily.      Marland Kitchen lisinopril (PRINIVIL,ZESTRIL) 10 MG tablet Take 1 tablet (10 mg total) by mouth daily.  90 tablet  1  . Melatonin 3 MG TABS Take 1 tablet by mouth at bedtime as  needed (sleep).      . Multiple Vitamin (MULTIVITAMIN WITH MINERALS) TABS Take 1 tablet by mouth daily.      . Pitavastatin Calcium (LIVALO) 2 MG TABS Take 1 tablet (2 mg total) by mouth daily.  30 tablet  5  . pseudoephedrine (SUDAFED) 60 MG tablet Take 60 mg by mouth every 4 (four) hours as needed for congestion.      . Vitamins-Lipotropics (LIPOFLAVONOID PO) Take 1 tablet by mouth as needed.        No current facility-administered medications on file prior to visit.    Review of Systems:  As per HPI- otherwise negative.   Physical Examination: Filed Vitals:   11/02/13 0801  BP: 130/90  Pulse: 86  Temp: 98.1 F (36.7 C)  Resp: 16   Filed Vitals:   11/02/13 0801  Height: 5' 2.5" (1.588 m)  Weight: 137 lb (62.143 kg)   Body mass index is 24.64 kg/(m^2). Ideal Body Weight: Weight in (lb) to have BMI = 25: 138.6  GEN: WDWN, NAD, Non-toxic, A & O x 3, looks well HEENT: Atraumatic, Normocephalic. Neck supple. No masses, No LAD. Ears and Nose: No  external deformity. CV: RRR, No M/G/R. No JVD. No thrill. No extra heart sounds. PULM: CTA B, no wheezes, crackles, rhonchi. No retractions. No resp. distress. No accessory muscle use. EXTR: No c/c/e NEURO Normal gait.  PSYCH: Normally interactive. Conversant. Not depressed or anxious appearing.  Calm demeanor.    Assessment and Plan: Hypertension - Plan: lisinopril (PRINIVIL,ZESTRIL) 10 MG tablet, Basic metabolic panel  Immunization due - Plan: Pneumococcal polysaccharide vaccine 23-valent greater than or equal to 2yo subcutaneous/IM  Hyponatremia  Refilled medication and check BMP today. History of hyponatremia so will follow-up on this today Gave pneumovax, and rx for zostavax today.  zostavax to be done 4 weeks after pneumovax- LMOM with this infomration.    Signed Lamar Blinks, MD

## 2013-11-06 ENCOUNTER — Encounter: Payer: Self-pay | Admitting: Podiatry

## 2013-11-06 ENCOUNTER — Ambulatory Visit (INDEPENDENT_AMBULATORY_CARE_PROVIDER_SITE_OTHER): Payer: BC Managed Care – PPO | Admitting: Podiatry

## 2013-11-06 VITALS — BP 114/84 | HR 64 | Resp 12

## 2013-11-06 DIAGNOSIS — L600 Ingrowing nail: Secondary | ICD-10-CM

## 2013-11-06 NOTE — Progress Notes (Signed)
   Subjective:    Patient ID: Amanda Curry, female    DOB: 12-Nov-1950, 63 y.o.   MRN: [id number]  HPI  PT STATED  B/L 2ND TOENAIL ARE THICK AND HAVE DISCOLORATION, BUT DOES NOT HURT. THE TOENAILS GETTING WORSE AND TRIED NO TREATMENT. BOTH OUTSIDE OF THE FEET THEY ITCH AND HOT FOR 5 YEARS AND IS BEEN THE SAME. TRIED NO TREATMENT.  She also complains of some burning in her feet noticeable only at night.  Review of Systems  HENT: Positive for sneezing.   Musculoskeletal: Positive for joint swelling.  Neurological: Positive for headaches.  All other systems reviewed and are negative.       Objective:   Physical Exam  Orientated x54 62 year old white female  Vascular: DP and PT pulses 2/4 bilaterally. Capillary fill is immediate bilaterally.  Neurological: Sensation to 10 g monofilament wire intact 5/5 bilaterally. Vibratory sensation intact bilaterally. Knee and ankle reflexes equal and reactive bilaterally.  Dermatological: Incurvated, dystrophic toenails second bilaterally with associated relatively long second toe. 10 texture and turgor within normal limits bilaterally  Musculoskeletal: Bilateral HAV and hammered second toes noted.        Assessment & Plan:   Assessment: Probable traumatic nail changes second toes bilaterally.  Satisfactory neurovascular status  Plan: Advised patient that I thought the nail changes in the second toes were from repetitive microtrauma and no specific treatment was indicated at this time.  In regards to her complaints of nighttime burning there are no specific obvious neurological  or dermatological causes at this time. I did advise her to confirm that her last blood glucose was within normal limits. I suggested that if she has increasing symptoms in regards to itching or burning to return for further evaluation.  Reappoint at patient's request

## 2013-11-07 ENCOUNTER — Encounter: Payer: Self-pay | Admitting: Podiatry

## 2013-11-13 DIAGNOSIS — N8182 Incompetence or weakening of pubocervical tissue: Secondary | ICD-10-CM | POA: Diagnosis not present

## 2013-12-25 DIAGNOSIS — Z01419 Encounter for gynecological examination (general) (routine) without abnormal findings: Secondary | ICD-10-CM | POA: Diagnosis not present

## 2013-12-25 DIAGNOSIS — Z1231 Encounter for screening mammogram for malignant neoplasm of breast: Secondary | ICD-10-CM | POA: Diagnosis not present

## 2013-12-25 DIAGNOSIS — Z1212 Encounter for screening for malignant neoplasm of rectum: Secondary | ICD-10-CM | POA: Diagnosis not present

## 2014-03-01 ENCOUNTER — Ambulatory Visit (INDEPENDENT_AMBULATORY_CARE_PROVIDER_SITE_OTHER): Payer: BC Managed Care – PPO | Admitting: Internal Medicine

## 2014-03-01 VITALS — BP 128/80 | HR 102 | Temp 98.5°F | Resp 18 | Ht 62.0 in | Wt 137.0 lb

## 2014-03-01 DIAGNOSIS — M25511 Pain in right shoulder: Secondary | ICD-10-CM

## 2014-03-01 DIAGNOSIS — M25519 Pain in unspecified shoulder: Secondary | ICD-10-CM | POA: Diagnosis not present

## 2014-03-01 MED ORDER — MELOXICAM 15 MG PO TABS: 15.0000 mg | ORAL_TABLET | Freq: Every day | ORAL | Status: DC

## 2014-03-01 NOTE — Patient Instructions (Signed)

## 2014-03-01 NOTE — Progress Notes (Addendum)
Subjective:  This chart was scribed for Amanda Lin, MD by Mercy Moore, Medial Scribe. This patient was seen in room 8 and the patient's care was started at 1:00 PM.    Patient ID: Amanda Curry, female    DOB: 10-10-1950, 63 y.o.   MRN: [id number]  HPI HPI Comments: Amanda Curry is a 63 y.o. female who presents to the [facility] complaining of right shoulder pain. Patient states that she has done a lot of painting this summer. Her pain is exacerbated with lifting heavy items and lifting her arm quickly. Patient reports discomfort when sleeping that does keep her awake. Patient reports hearing the shoulder pop. She denies recent surgeries or injections.  Patient Active Problem List   Diagnosis Date Noted  . HTN (hypertension) 03/21/2013  . Other and unspecified hyperlipidemia 03/21/2013   Past Medical History  Diagnosis Date  . Hypertension   . Hyperlipidemia   . Arthritis   . Ganglion of left wrist     thumb joint   Past Surgical History  Procedure Laterality Date  . Tubal ligation    . Tonsillectomy     No Known Allergies Prior to Admission medications   Medication Sig Start Date End Date Taking? Authorizing Provider  calcium-vitamin D (OSCAL WITH D) 500-200 MG-UNIT per tablet Take 1 tablet by mouth daily.   Yes Historical Provider, MD  fish oil-omega-3 fatty acids 1000 MG capsule Take 2 g by mouth daily.   Yes Historical Provider, MD  lisinopril (PRINIVIL,ZESTRIL) 10 MG tablet Take 1 tablet (10 mg total) by mouth daily. 11/02/13  Yes Gay Filler Copland, MD  Melatonin 3 MG TABS Take 1 tablet by mouth at bedtime as needed (sleep).   Yes Historical Provider, MD  Multiple Vitamin (MULTIVITAMIN WITH MINERALS) TABS Take 1 tablet by mouth daily.   Yes Historical Provider, MD  Pitavastatin Calcium (LIVALO) 2 MG TABS Take 1 tablet (2 mg total) by mouth daily. 06/21/13  Yes Robyn Haber, MD  pseudoephedrine (SUDAFED) 60 MG tablet Take 60 mg by mouth every 4  (four) hours as needed for congestion.   Yes Historical Provider, MD  Vitamins-Lipotropics (LIPOFLAVONOID PO) Take 1 tablet by mouth as needed.    Yes Historical Provider, MD   History   Social History  . Marital Status: Married    Spouse Name: N/A    Number of Children: 4  . Years of Education: N/A   Occupational History  . teacher    Social History Main Topics  . Smoking status: Never Smoker   . Smokeless tobacco: Never Used  . Alcohol Use: Yes     Comment: rarely  . Drug Use: No  . Sexual Activity: Yes   Other Topics Concern  . Not on file   Social History Narrative  . No narrative on file     Review of Systems  Constitutional: Negative for fever.  Musculoskeletal: Positive for arthralgias and neck pain.  Neurological: Negative for weakness and numbness.       Objective:   Physical Exam  Nursing note and vitals reviewed. Constitutional: She is oriented to person, place, and time. She appears well-developed and well-nourished. No distress.  HENT:  Head: Normocephalic and atraumatic.  Eyes: EOM are normal.  Neck: Normal range of motion. Neck supple.  Cardiovascular: Normal rate.   Pulmonary/Chest: Effort normal. No respiratory distress.  Musculoskeletal: Normal range of motion. She exhibits no edema.  Right shoulder: good ROM with pain only on  abduction against resistance. No crepitus. Trapezius nontender. Neck normal. Biceps nontender. Scapula nontender.  Neurological: She is alert and oriented to person, place, and time.  Skin: Skin is warm and dry.  Psychiatric: She has a normal mood and affect. Her behavior is normal.    Filed Vitals:   03/01/14 1249  BP: 128/80  Pulse: 102  Temp: 98.5 F (36.9 C)  Resp: 18         Assessment & Plan:  No diagnosis found. Meds ordered this encounter  Medications  . meloxicam (MOBIC) 15 MG tablet    Sig: Take 1 tablet (15 mg total) by mouth daily.    Dispense:  30 tablet    Refill:  0   Exercises  given F/u 36mo  I have completed the patient encounter in its entirety as documented by the scribe, with editing by me where necessary. Robert P. Laney Pastor, M.D.

## 2014-04-02 ENCOUNTER — Ambulatory Visit (INDEPENDENT_AMBULATORY_CARE_PROVIDER_SITE_OTHER): Payer: BC Managed Care – PPO | Admitting: Family Medicine

## 2014-04-02 ENCOUNTER — Ambulatory Visit (INDEPENDENT_AMBULATORY_CARE_PROVIDER_SITE_OTHER): Payer: BC Managed Care – PPO

## 2014-04-02 VITALS — BP 138/82 | HR 87 | Temp 98.3°F | Resp 16 | Ht 62.0 in | Wt 136.0 lb

## 2014-04-02 DIAGNOSIS — R0789 Other chest pain: Secondary | ICD-10-CM

## 2014-04-02 DIAGNOSIS — R071 Chest pain on breathing: Secondary | ICD-10-CM

## 2014-04-02 DIAGNOSIS — M25569 Pain in unspecified knee: Secondary | ICD-10-CM

## 2014-04-02 DIAGNOSIS — M25561 Pain in right knee: Secondary | ICD-10-CM

## 2014-04-02 NOTE — Progress Notes (Signed)
[facility] [address] [place] [address] [phone]  Date:  04/02/2014   Name:  MARCELLINA JONSSON   DOB:  1950-10-27   MRN:  [id number]  PCP:  Robyn Haber, MD    Chief Complaint: Knee Pain and Gastrophageal Reflux   History of Present Illness:  CHARLOT GOUIN is a 63 y.o. very pleasant female patient who presents with the following:  She is here today with a complaint of right knee pain for about one month.  This knee troubled her about 10 years ago, but has been ok over the last few years.  She is not aware of any acute injury.  Never had any knee surgery, but she does have a knee brace at home.    No recent knee films that I can see.  She has been using her knee brace which does help.   She never had an MRI, but did see [facility] in the past for this issue  She has also noted some pain in her left side- under the left breast.  It it "just a twinge" that comes and goes quickly, occures intermittently.  It is generally worse after she gets up in the am.  It is not worse with exertion.  She has noted this for about one week. She has not noted a rash.  She does exercise some and never has CP with exercise.   Her mammogram is UTD- done just a few months ago.  It was negative Patient Active Problem List   Diagnosis Date Noted  . HTN (hypertension) 03/21/2013  . Other and unspecified hyperlipidemia 03/21/2013    Past Medical History  Diagnosis Date  . Hypertension   . Hyperlipidemia   . Arthritis   . Ganglion of left wrist     thumb joint    Past Surgical History  Procedure Laterality Date  . Tubal ligation    . Tonsillectomy      History  Substance Use Topics  . Smoking status: Never Smoker   . Smokeless tobacco: Never Used  . Alcohol Use: Yes     Comment: rarely    Family History  Problem Relation Age of Onset  . Colon cancer Maternal Grandmother   . Prostate cancer Father   . Alzheimer's disease Mother   . Hypertension Sister      No Known Allergies  Medication list has been reviewed and updated.  Current Outpatient Prescriptions on File Prior to Visit  Medication Sig Dispense Refill  . calcium-vitamin D (OSCAL WITH D) 500-200 MG-UNIT per tablet Take 1 tablet by mouth daily.      . fish oil-omega-3 fatty acids 1000 MG capsule Take 2 g by mouth daily.      Marland Kitchen lisinopril (PRINIVIL,ZESTRIL) 10 MG tablet Take 1 tablet (10 mg total) by mouth daily.  90 tablet  3  . Melatonin 3 MG TABS Take 1 tablet by mouth at bedtime as needed (sleep).      . meloxicam (MOBIC) 15 MG tablet Take 1 tablet (15 mg total) by mouth daily.  30 tablet  0  . Multiple Vitamin (MULTIVITAMIN WITH MINERALS) TABS Take 1 tablet by mouth daily.      . Pitavastatin Calcium (LIVALO) 2 MG TABS Take 1 tablet (2 mg total) by mouth daily.  30 tablet  5  . pseudoephedrine (SUDAFED) 60 MG tablet Take 60 mg by mouth every 4 (four) hours as needed for congestion.      . Vitamins-Lipotropics (  LIPOFLAVONOID PO) Take 1 tablet by mouth as needed.        No current facility-administered medications on file prior to visit.    Review of Systems:  As per HPI- otherwise negative.   Physical Examination: Filed Vitals:   04/02/14 0830  BP: 138/82  Pulse: 87  Temp: 98.3 F (36.8 C)  Resp: 16   Filed Vitals:   04/02/14 0830  Height: 5\' 2"  (1.575 m)  Weight: 136 lb (61.689 kg)   Body mass index is 24.87 kg/(m^2). Ideal Body Weight: Weight in (lb) to have BMI = 25: 136.4  GEN: WDWN, NAD, Non-toxic, A & O x 3, looks well  HEENT: Atraumatic, Normocephalic. Neck supple. No masses, No LAD. Ears and Nose: No external deformity. CV: RRR, No M/G/R. No JVD. No thrill. No extra heart sounds. PULM: CTA B, no wheezes, crackles, rhonchi. No retractions. No resp. distress. No accessory muscle use. EXTR: No c/c/e NEURO Normal gait.  PSYCH: Normally interactive. Conversant. Not depressed or anxious appearing.  Calm demeanor.  Right knee: she has minimal tenderness  of the lateral joint line. No crepitus, heat, swelling or redness.  Normal ROM and the knee is stable She has clearly reproducible chest wall pain in the left sided chest wall through the breast tissue.  No evidence of mass or dimpling of the breast.     [facility] reading (PRIMARY) by  Dr. Lorelei Pont. Right knee: moderate degenerative change under patella, otherwise mild degenerative change CXR: negative  RIGHT KNEE - COMPLETE 4+ VIEW  COMPARISON: 02/10/2007  FINDINGS: Progressive joint space narrowing and spurring, most pronounced in the lateral and patellofemoral compartments. No joint effusion. No acute bony abnormality. Specifically, no fracture, subluxation, or dislocation. Soft tissues are intact.  IMPRESSION: Mild to moderate osteoarthritis within the lateral and patellofemoral compartments, progressed since 2008.  CHEST 2 VIEW  COMPARISON: None.  FINDINGS: The lungs are clear. The heart size and pulmonary vascularity are normal. No adenopathy. There is thoracolumbar dextroscoliosis. No focal bone lesions.  IMPRESSION: No edema or consolidation.  Assessment and Plan: Chest wall pain - Plan: DG Chest 2 View  Pain in right knee - Plan: DG Knee Complete 4 Views Right  Mabry is here today with pain in her right knee and in her chest.   Her knee does show some DJD.  At this point she is not interested in a steroid injection or ortho referral.  She will continue to use her brace and will let me know if not better soon Chest wall pain: this is clearly reproducible MSK pain.  Reassurance, OTC medications as needed.  She will seek care if any persistent, different or exertional pain  Signed Lamar Blinks, MD

## 2014-04-02 NOTE — Patient Instructions (Signed)
Continue to use your knee brace as needed.  You might try some ibuprofen or aleve (do not use if you are still taking the mobic).   Certainly call [facility] if you would like [facility] have [facility]    Address: [address], [place], [place] [address]  [phone]  Your chest pain does seem to be due to pain in the muscles or bones.  I do not see evidence of anything dangerous.  Let me know if your pain does not get better in the next couple of weeks- Sooner if worse.

## 2014-04-03 ENCOUNTER — Encounter: Payer: Self-pay | Admitting: Family Medicine

## 2014-04-08 DIAGNOSIS — B373 Candidiasis of vulva and vagina: Secondary | ICD-10-CM | POA: Diagnosis not present

## 2014-04-08 DIAGNOSIS — N8182 Incompetence or weakening of pubocervical tissue: Secondary | ICD-10-CM | POA: Diagnosis not present

## 2014-04-08 DIAGNOSIS — B3731 Acute candidiasis of vulva and vagina: Secondary | ICD-10-CM | POA: Diagnosis not present

## 2014-05-03 ENCOUNTER — Ambulatory Visit (INDEPENDENT_AMBULATORY_CARE_PROVIDER_SITE_OTHER): Payer: BC Managed Care – PPO | Admitting: Internal Medicine

## 2014-05-03 VITALS — BP 114/80 | HR 85 | Temp 98.3°F | Resp 12 | Ht 62.0 in | Wt 137.1 lb

## 2014-05-03 DIAGNOSIS — M25519 Pain in unspecified shoulder: Secondary | ICD-10-CM | POA: Diagnosis not present

## 2014-05-03 DIAGNOSIS — I10 Essential (primary) hypertension: Secondary | ICD-10-CM | POA: Diagnosis not present

## 2014-05-03 DIAGNOSIS — E871 Hypo-osmolality and hyponatremia: Secondary | ICD-10-CM

## 2014-05-03 DIAGNOSIS — E785 Hyperlipidemia, unspecified: Secondary | ICD-10-CM | POA: Diagnosis not present

## 2014-05-03 LAB — POCT CBC
Granulocyte percent: 57.1 %G (ref 37–80)
HEMATOCRIT: 38.7 % (ref 37.7–47.9)
HEMOGLOBIN: 12.5 g/dL (ref 12.2–16.2)
LYMPH, POC: 1.8 (ref 0.6–3.4)
MCH: 29.3 pg (ref 27–31.2)
MCHC: 32.3 g/dL (ref 31.8–35.4)
MCV: 90.8 fL (ref 80–97)
MID (cbc): 0.5 (ref 0–0.9)
MPV: 7.3 fL (ref 0–99.8)
POC Granulocyte: 3 (ref 2–6.9)
POC LYMPH %: 34.1 % (ref 10–50)
POC MID %: 8.8 % (ref 0–12)
Platelet Count, POC: 302 10*3/uL (ref 142–424)
RBC: 4.26 M/uL (ref 4.04–5.48)
RDW, POC: 12.9 %
WBC: 5.3 10*3/uL (ref 4.6–10.2)

## 2014-05-03 MED ORDER — MELOXICAM 15 MG PO TABS: 15.0000 mg | ORAL_TABLET | Freq: Every day | ORAL | Status: DC

## 2014-05-03 MED ORDER — PITAVASTATIN CALCIUM 2 MG PO TABS: 1.0000 | ORAL_TABLET | Freq: Every day | ORAL | Status: DC

## 2014-05-03 NOTE — Progress Notes (Signed)
   Subjective:    Patient ID: Amanda Curry, female    DOB: 1950/11/12, 63 y.o.   MRN: [id number] HPI Comments: This chart was scribed for Amanda Lin, MD by Martinique Peace, ED Scribe. The patient was seen in [facility]14. The patient's care was started at 3:45 PM.     HPI HPI Comments: Amanda Curry is a 63 y.o. female who presents to the [facility] needing medication refill for hypertension. She is currently on linsinopril. Pt's BP readings today were 114/80.  Pt is non-smoker.   She also reports recent improvement in shoulder that she was previously seen for. She reports that it is currently around 90% back to normal and is wondering about what more she can do to allow for continued improvement.  See last OV  Review of Systems  Constitutional: Negative for fever, chills, activity change, appetite change, fatigue and unexpected weight change.  HENT: Negative for trouble swallowing.   Eyes: Negative for visual disturbance.  Respiratory: Negative for chest tightness and shortness of breath.   Cardiovascular: Negative for chest pain, palpitations and leg swelling.  Gastrointestinal: Negative for nausea, vomiting and abdominal pain.  Genitourinary: Negative for frequency and difficulty urinating.  Neurological: Negative for light-headedness and headaches.  Hematological: Does not bruise/bleed easily.  Psychiatric/Behavioral: Negative for behavioral problems and sleep disturbance.       Objective:   Physical Exam  Nursing note and vitals reviewed. Constitutional: She is oriented to person, place, and time. She appears well-developed and well-nourished. No distress.  HENT:  Head: Normocephalic and atraumatic.  Eyes: Conjunctivae and EOM are normal. Pupils are equal, round, and reactive to light.  Neck: Neck supple. No thyromegaly present.  Cardiovascular: Normal rate, regular rhythm, normal heart sounds and intact distal pulses.   No murmur heard. Pulmonary/Chest: Effort normal. No respiratory  distress. She has no wheezes.  Musculoskeletal: Normal range of motion. She exhibits no edema.  Lymphadenopathy:    She has no cervical adenopathy.  Neurological: She is alert and oriented to person, place, and time. No cranial nerve deficit.  Skin: Skin is warm and dry.  Psychiatric: She has a normal mood and affect. Her behavior is normal.          Assessment & Plan:  I have completed the patient encounter in its entirety as documented by the scribe, with editing by me where necessary. Nicolle Heward P. Laney Pastor, M.D.   Essential hypertension, benign - Plan: POCT CBC, Lipid panel, Comprehensive metabolic panel  Hyponatremia - Plan: POCT CBC, Lipid panel, Comprehensive metabolic panel  Shoulder pain, unspecified laterality - Plan: POCT CBC, Lipid panel, Comprehensive metabolic panel  Other and unspecified hyperlipidemia - Plan: POCT CBC, Lipid panel, Comprehensive metabolic panel  Meds ordered this encounter  Medications  . meloxicam (MOBIC) 15 MG tablet    Sig: Take 1 tablet (15 mg total) by mouth daily.    Dispense:  30 tablet    Refill:  0  . Pitavastatin Calcium (LIVALO) 2 MG TABS    Sig: Take 1 tablet (2 mg total) by mouth daily.    Dispense:  30 tablet    Refill:  5   Continue with aggressive rehabilitation of shoulder at home and add strength maneuvers Notify labs

## 2014-05-04 LAB — COMPREHENSIVE METABOLIC PANEL
ALBUMIN: 4.2 g/dL (ref 3.5–5.2)
ALK PHOS: 47 U/L (ref 39–117)
ALT: 14 U/L (ref 0–35)
AST: 20 U/L (ref 0–37)
BUN: 17 mg/dL (ref 6–23)
CO2: 26 mEq/L (ref 19–32)
Calcium: 9.2 mg/dL (ref 8.4–10.5)
Chloride: 99 mEq/L (ref 96–112)
Creat: 0.76 mg/dL (ref 0.50–1.10)
Glucose, Bld: 99 mg/dL (ref 70–99)
POTASSIUM: 4.2 meq/L (ref 3.5–5.3)
SODIUM: 132 meq/L — AB (ref 135–145)
TOTAL PROTEIN: 6.5 g/dL (ref 6.0–8.3)
Total Bilirubin: 0.2 mg/dL (ref 0.2–1.2)

## 2014-05-04 LAB — LIPID PANEL
CHOL/HDL RATIO: 3.7 ratio
Cholesterol: 220 mg/dL — ABNORMAL HIGH (ref 0–200)
HDL: 60 mg/dL (ref >39)
LDL Cholesterol: 119 mg/dL — ABNORMAL HIGH (ref 0–99)
Triglycerides: 203 mg/dL — ABNORMAL HIGH (ref <150)
VLDL: 41 mg/dL — AB (ref 0–40)

## 2014-05-05 ENCOUNTER — Encounter: Payer: Self-pay | Admitting: Internal Medicine

## 2014-05-07 ENCOUNTER — Telehealth: Payer: Self-pay

## 2014-05-07 NOTE — Telephone Encounter (Signed)
PA needed for Livalo. Pt reported she tried simvastatin 10 mg and 20 mg, then generic Lipitor, then Crestor and all caused her feet to burn. Pt was switched to Livalo on 03/02/13. Completed form on covermymeds. pending

## 2014-05-13 NOTE — Telephone Encounter (Signed)
PA was denied bc pt has not tried/failed two preferred alternatives. Pt has which was answered "yes" on covermymeds. She has tried/failed due to intolerance both atorvastatin and simvastatin which are two of the alternatives. I wrote an appeal letter and faxed to [facility] department.

## 2014-05-15 ENCOUNTER — Telehealth: Payer: Self-pay | Admitting: *Deleted

## 2014-05-15 NOTE — Telephone Encounter (Signed)
Sent PA to covermymeds for livalo 2mg  tablets.

## 2014-05-19 NOTE — Telephone Encounter (Signed)
Phoned information into insurance. Needed to document prior statins patient has tried.

## 2014-05-20 ENCOUNTER — Telehealth: Payer: Self-pay | Admitting: *Deleted

## 2014-05-20 NOTE — Telephone Encounter (Signed)
Livalo 2mg  PA has been approved 05/20/2014-05/20/2015.

## 2014-07-03 DIAGNOSIS — N8182 Incompetence or weakening of pubocervical tissue: Secondary | ICD-10-CM | POA: Diagnosis not present

## 2014-07-17 NOTE — Telephone Encounter (Signed)
This was approved see notes under 05/20/14.

## 2014-08-06 DIAGNOSIS — N950 Postmenopausal bleeding: Secondary | ICD-10-CM | POA: Diagnosis not present

## 2014-08-14 ENCOUNTER — Telehealth: Payer: Self-pay

## 2014-08-14 NOTE — Telephone Encounter (Signed)
Spoke to patient.  She will come in to get her flu shot.

## 2014-08-18 ENCOUNTER — Encounter: Payer: Self-pay | Admitting: *Deleted

## 2014-09-23 ENCOUNTER — Encounter: Payer: Self-pay | Admitting: Internal Medicine

## 2014-09-23 ENCOUNTER — Ambulatory Visit (INDEPENDENT_AMBULATORY_CARE_PROVIDER_SITE_OTHER): Payer: BLUE CROSS/BLUE SHIELD | Admitting: Internal Medicine

## 2014-09-23 VITALS — BP 132/86 | HR 84 | Ht 62.0 in | Wt 134.0 lb

## 2014-09-23 DIAGNOSIS — Z1211 Encounter for screening for malignant neoplasm of colon: Secondary | ICD-10-CM

## 2014-09-23 DIAGNOSIS — R197 Diarrhea, unspecified: Secondary | ICD-10-CM | POA: Diagnosis not present

## 2014-09-23 MED ORDER — MOVIPREP 100 G PO SOLR: 1.0000 | Freq: Once | ORAL | Status: DC

## 2014-09-23 NOTE — Progress Notes (Signed)
Amanda Curry 05/25/1951 [id number]  Note: This dictation was prepared with Dragon digital system. Any transcriptional errors that result from this procedure are unintentional.   History of Present Illness: This is a 64 year old white female due for recall colonoscopy. Last colonoscopy in 2009 showed colon polyps but pathology reports described  polypoid mucosa. There is a positive family history of colon cancer in her grandmother. Patient was seen by Nicoletta Ba in July 2014 for acute diarrheal illness which responded to Flagyl. She has occasional gas and bloating. She had a recent episode of acute diarrhea after she ate a large amount of food at a [employer]    Past Medical History  Diagnosis Date  . Hypertension   . Hyperlipidemia   . Arthritis   . Ganglion of left wrist     thumb joint  . Diverticulosis     Past Surgical History  Procedure Laterality Date  . Tubal ligation    . Tonsillectomy      No Known Allergies  Family history and social history have been reviewed.  Review of Systems:   The remainder of the 10 point ROS is negative except as outlined in the H&P  Physical Exam: General Appearance Well developed, in no distress Eyes  Non icteric  HEENT  Non traumatic, normocephalic  Mouth No lesion, tongue papillated, no cheilosis Neck Supple without adenopathy, thyroid not enlarged, no carotid bruits, no JVD Lungs Clear to auscultation bilaterally COR Normal S1, normal S2, regular rhythm, no murmur, quiet precordium Abdomen soft nontender with normoactive bowel sounds. No distention or tympany. Liver edge at costal margin Rectal not done  Extremities  No pedal edema Skin No lesions Neurological Alert and oriented x 3 Psychological Normal mood and affect  Assessment and Plan:   64 year old white female with the sounds like irritable bowel syndrome, gastro colic reflex. She is due for recall colonoscopy because of personal history of colon polyp and  colon cancer in a indirect relatives. We will schedule colonoscopy in near future. I have discussed sedation prep and the procedure    Delfin Edis @TODAY (<PARAMETER> error)@

## 2014-09-23 NOTE — Patient Instructions (Addendum)
[facility] You have been scheduled for a colonoscopy. Please follow written instructions given to you at your visit today.  Please pick up your prep kit at the pharmacy within the next 1-3 days. If you use inhalers (even only as needed), please bring them with you on the day of your procedure. Your physician has requested that you go to www.startemmi.com and enter the access code given to you at your visit today. This web site gives a general overview about your procedure. However, you should still follow specific instructions given to you by our office regarding your preparation for the procedure.

## 2014-10-21 ENCOUNTER — Encounter: Payer: Medicare Other | Admitting: Internal Medicine

## 2014-10-21 ENCOUNTER — Ambulatory Visit (AMBULATORY_SURGERY_CENTER): Payer: BLUE CROSS/BLUE SHIELD | Admitting: Internal Medicine

## 2014-10-21 ENCOUNTER — Encounter: Payer: Self-pay | Admitting: Internal Medicine

## 2014-10-21 VITALS — BP 152/88 | HR 79 | Temp 98.2°F | Resp 33 | Ht 62.0 in | Wt 134.0 lb

## 2014-10-21 DIAGNOSIS — D124 Benign neoplasm of descending colon: Secondary | ICD-10-CM | POA: Diagnosis not present

## 2014-10-21 DIAGNOSIS — Z1211 Encounter for screening for malignant neoplasm of colon: Secondary | ICD-10-CM

## 2014-10-21 DIAGNOSIS — D122 Benign neoplasm of ascending colon: Secondary | ICD-10-CM | POA: Diagnosis not present

## 2014-10-21 MED ORDER — SODIUM CHLORIDE 0.9 % IV SOLN: 500.0000 mL | INTRAVENOUS | Status: DC

## 2014-10-21 NOTE — Progress Notes (Signed)
Called to room to assist during endoscopic procedure.  Patient ID and intended procedure confirmed with present staff. Received instructions for my participation in the procedure from the performing physician.  

## 2014-10-21 NOTE — Patient Instructions (Signed)
YOU HAD AN ENDOSCOPIC PROCEDURE TODAY AT THE [facility]: Refer to the procedure report that was given to you for any specific questions about what was found during the examination.  If the procedure report does not answer your questions, please call your gastroenterologist to clarify.  If you requested that your care partner not be given the details of your procedure findings, then the procedure report has been included in a sealed envelope for you to review at your convenience later.  YOU SHOULD EXPECT: Some feelings of bloating in the abdomen. Passage of more gas than usual.  Walking can help get rid of the air that was put into your GI tract during the procedure and reduce the bloating. If you had a lower endoscopy (such as a colonoscopy or flexible sigmoidoscopy) you may notice spotting of blood in your stool or on the toilet paper. If you underwent a bowel prep for your procedure, then you may not have a normal bowel movement for a few days.  DIET: Your first meal following the procedure should be a light meal and then it is ok to progress to your normal diet.  A half-sandwich or bowl of soup is an example of a good first meal.  Heavy or fried foods are harder to digest and may make you feel nauseous or bloated.  Likewise meals heavy in dairy and vegetables can cause extra gas to form and this can also increase the bloating.  Drink plenty of fluids but you should avoid alcoholic beverages for 24 hours.  ACTIVITY: Your care partner should take you home directly after the procedure.  You should plan to take it easy, moving slowly for the rest of the day.  You can resume normal activity the day after the procedure however you should NOT DRIVE or use heavy machinery for 24 hours (because of the sedation medicines used during the test).    SYMPTOMS TO REPORT IMMEDIATELY: A gastroenterologist can be reached at any hour.  During normal business hours, 8:30 AM to 5:00 PM Monday through Friday,  call [phone].  After hours and on weekends, please call the GI answering service at [phone] who will take a message and have the physician on call contact you.   Following lower endoscopy (colonoscopy or flexible sigmoidoscopy):  Excessive amounts of blood in the stool  Significant tenderness or worsening of abdominal pains  Swelling of the abdomen that is new, acute  Fever of 100F or higher  FOLLOW UP: If any biopsies were taken you will be contacted by phone or by letter within the next 1-3 weeks.  Call your gastroenterologist if you have not heard about the biopsies in 3 weeks.  Our staff will call the home number listed on your records the next business day following your procedure to check on you and address any questions or concerns that you may have at that time regarding the information given to you following your procedure. This is a courtesy call and so if there is no answer at the home number and we have not heard from you through the emergency physician on call, we will assume that you have returned to your regular daily activities without incident.  SIGNATURES/CONFIDENTIALITY: You and/or your care partner have signed paperwork which will be entered into your electronic medical record.  These signatures attest to the fact that that the information above on your After Visit Summary has been reviewed and is understood.  Full responsibility of the confidentiality of this   discharge information lies with you and/or your care-partner.   You might note some irritation in your nose or some drainage.  This may cause feelings of congestion.  This is from the oxygen, which can be irritating.  There is no need for concern, this should clear up in a day or so.  Please read over handouts about polyps, diverticulosis and high fiber diets  Continue your normal medications

## 2014-10-21 NOTE — Op Note (Signed)
[facility]  [address] [place], [id number]   COLONOSCOPY PROCEDURE REPORT  PATIENT: Amanda, Curry  MR#: [id number] BIRTHDATE: [birth year] , 2  yrs. old GENDER: female ENDOSCOPIST: Lafayette Dragon, MD REFERRED JS:RPRX Lauenstein, M.D. PROCEDURE DATE:  10/21/2014 PROCEDURE:   Colonoscopy with cold biopsy polypectomy First Screening Colonoscopy - Avg.  risk and is 50 yrs.  old or older - No.  Prior Negative Screening - Now for repeat screening. N/A  History of Adenoma - Now for follow-up colonoscopy & has been > or = to 3 yrs.  N/A  Polyps Removed Today? Yes. ASA CLASS:   Class II INDICATIONS:family history of colon cancer in a grand parent.  Last colonoscopy in 2001 and in 2009.  Small polyp removed.  Path report consistent with polypoid mucosa. MEDICATIONS: Monitored anesthesia care and Propofol 270 mg IV  DESCRIPTION OF PROCEDURE:   After the risks benefits and alternatives of the procedure were thoroughly explained, informed consent was obtained.  The digital rectal exam revealed no abnormalities of the rectum.   The LB [id number]  endoscope was introduced through the anus and advanced to the cecum, which was identified by both the appendix and ileocecal valve. No adverse events experienced.   The quality of the prep was good, using MoviPrep  The instrument was then slowly withdrawn as the colon was fully examined.      COLON FINDINGS: A sessile polyp measuring 4 mm in size was found in the descending colon.  A polypectomy was performed with cold forceps.  The resection was complete, the polyp tissue was completely retrieved and sent to histology.   There was moderate diverticulosis noted in the descending colon with associated muscular hypertrophy, tortuosity and angulation.  Retroflexed views revealed no abnormalities. The time to cecum=7 minutes 45 seconds. Withdrawal time=8 minutes 59 seconds.  The scope was withdrawn and the procedure  completed. COMPLICATIONS: There were no immediate complications.  ENDOSCOPIC IMPRESSION: 1.   Sessile polyp was found in the descending colon; polypectomy was performed with cold forceps 2.   There was moderate diverticulosis noted in the descending colon   RECOMMENDATIONS: 1.  Await biopsy results 2.  High fiber diet Recall colonoscopy pending path report  eSigned:  Lafayette Dragon, MD 10/21/2014 3:12 PM   cc:   PATIENT NAME:  Amanda, Curry MR#: [id number]

## 2014-10-21 NOTE — Progress Notes (Signed)
Pt stable to RR With admission Pt BP elevated per documentation Dr Olevia Perches consulted Checked BP in right arm and left leg remaining elevated Medicated as charted

## 2014-10-22 ENCOUNTER — Telehealth: Payer: Self-pay

## 2014-10-22 NOTE — Telephone Encounter (Signed)
No answer, left message

## 2014-10-28 ENCOUNTER — Encounter: Payer: Self-pay | Admitting: Internal Medicine

## 2014-11-12 DIAGNOSIS — D2271 Melanocytic nevi of right lower limb, including hip: Secondary | ICD-10-CM | POA: Diagnosis not present

## 2014-11-12 DIAGNOSIS — Z85828 Personal history of other malignant neoplasm of skin: Secondary | ICD-10-CM | POA: Diagnosis not present

## 2014-11-12 DIAGNOSIS — L820 Inflamed seborrheic keratosis: Secondary | ICD-10-CM | POA: Diagnosis not present

## 2014-11-12 DIAGNOSIS — L814 Other melanin hyperpigmentation: Secondary | ICD-10-CM | POA: Diagnosis not present

## 2014-11-25 DIAGNOSIS — N8182 Incompetence or weakening of pubocervical tissue: Secondary | ICD-10-CM | POA: Diagnosis not present

## 2014-12-26 ENCOUNTER — Ambulatory Visit (INDEPENDENT_AMBULATORY_CARE_PROVIDER_SITE_OTHER): Payer: Medicare Other | Admitting: Physician Assistant

## 2014-12-26 VITALS — BP 138/90 | HR 80 | Temp 98.4°F | Resp 18 | Ht 62.0 in | Wt 133.0 lb

## 2014-12-26 DIAGNOSIS — M436 Torticollis: Secondary | ICD-10-CM | POA: Diagnosis not present

## 2014-12-26 MED ORDER — MELOXICAM 15 MG PO TABS: 15.0000 mg | ORAL_TABLET | Freq: Every day | ORAL | Status: DC

## 2014-12-26 MED ORDER — CYCLOBENZAPRINE HCL 10 MG PO TABS: 5.0000 mg | ORAL_TABLET | Freq: Every evening | ORAL | Status: DC | PRN

## 2014-12-26 NOTE — Progress Notes (Signed)
Patient ID: Amanda Curry, female    DOB: 1950/09/05, 64 y.o.   MRN: [id number]  PCP: Robyn Haber, MD  Subjective:   Chief Complaint  Patient presents with  . Neck Pain    x10 days   . Headache    HPI Presents for evaluation of almost 2 weeks of RIGHT sided neck pain. Symptoms began when she moved her head to get hair out of her face. Since then it has been intermittent, usually resolving with ibuprofen 200 mg 2 PO BID, but sometimes grabbing pain is severe with certain movements. Ice didn't help. No previous pain like this.  Headache is mild, also resolves with ibuprofen.    Review of Systems  Constitutional: Negative for fever and chills.  Eyes: Negative for pain and visual disturbance.  Musculoskeletal: Positive for neck pain. Negative for arthralgias, gait problem and neck stiffness.  Skin: Negative for rash.  Neurological: Positive for headaches. Negative for dizziness, weakness, light-headedness and numbness.       Patient Active Problem List   Diagnosis Date Noted  . HTN (hypertension) 03/21/2013  . Other and unspecified hyperlipidemia 03/21/2013     Prior to Admission medications   Medication Sig Start Date End Date Taking? Authorizing Provider  calcium-vitamin D (OSCAL WITH D) 500-200 MG-UNIT per tablet Take 1 tablet by mouth daily.   Yes Historical Provider, MD  fish oil-omega-3 fatty acids 1000 MG capsule Take 2 g by mouth daily.   Yes Historical Provider, MD  glucosamine-chondroitin 500-400 MG tablet Take 1 tablet by mouth 3 (three) times daily.   Yes Historical Provider, MD  lisinopril (PRINIVIL,ZESTRIL) 10 MG tablet Take 1 tablet (10 mg total) by mouth daily. 11/02/13  Yes Gay Filler Copland, MD  Melatonin 3 MG TABS Take 1 tablet by mouth at bedtime as needed (sleep).   Yes Historical Provider, MD  Multiple Vitamin (MULTIVITAMIN WITH MINERALS) TABS Take 1 tablet by mouth daily.   Yes Historical Provider, MD  Pitavastatin Calcium (LIVALO) 2 MG TABS Take 1  tablet (2 mg total) by mouth daily. Patient taking differently: Take 0.5 tablets by mouth daily.  05/03/14  Yes [name], MD  pseudoephedrine (SUDAFED) 60 MG tablet Take 60 mg by mouth every 4 (four) hours as needed for congestion.   Yes Historical Provider, MD     No Known Allergies     Objective:  Physical Exam  Constitutional: She is oriented to person, place, and time. She appears well-developed and well-nourished. She is active and cooperative. No distress.  BP 138/90 mmHg  Pulse 80  Temp(Src) 98.4 F (36.9 C) (Oral)  Resp 18  Ht 5\' 2"  (1.575 m)  Wt 133 lb (60.328 kg)  BMI 24.32 kg/m2  SpO2 98%   Eyes: Conjunctivae are normal.  Neck: Normal range of motion. Neck supple. No thyromegaly present.  Pulmonary/Chest: Effort normal.  Musculoskeletal:       Cervical back: She exhibits tenderness, pain and spasm. She exhibits normal range of motion, no bony tenderness, no swelling, no edema, no deformity, no laceration and normal pulse.       Thoracic back: Normal.       Lumbar back: Normal.       Back:  Lymphadenopathy:    She has no cervical adenopathy.  Neurological: She is alert and oriented to person, place, and time.  Psychiatric: She has a normal mood and affect. Her speech is normal and behavior is normal.  Assessment & Plan:   1. Wry neck Warm compresses. Gentle ROM. If symptoms persist, RTC for additional evaluation. - meloxicam (MOBIC) 15 MG tablet; Take 1 tablet (15 mg total) by mouth daily.  Dispense: 30 tablet; Refill: 0 - cyclobenzaprine (FLEXERIL) 10 MG tablet; Take 0.5-1 tablets (5-10 mg total) by mouth at bedtime as needed for muscle spasms.  Dispense: 30 tablet; Refill: 0   Fara Chute, PA-C Physician Assistant-Certified [facility] .

## 2015-01-20 ENCOUNTER — Ambulatory Visit (INDEPENDENT_AMBULATORY_CARE_PROVIDER_SITE_OTHER): Payer: BLUE CROSS/BLUE SHIELD | Admitting: Physician Assistant

## 2015-01-20 VITALS — BP 115/79 | HR 81 | Temp 97.9°F | Resp 18 | Wt 134.0 lb

## 2015-01-20 DIAGNOSIS — I10 Essential (primary) hypertension: Secondary | ICD-10-CM | POA: Diagnosis not present

## 2015-01-20 DIAGNOSIS — E7800 Pure hypercholesterolemia, unspecified: Secondary | ICD-10-CM

## 2015-01-20 LAB — LIPID PANEL
Cholesterol: 214 mg/dL — ABNORMAL HIGH (ref 0–200)
HDL: 81 mg/dL (ref >=46)
LDL CALC: 106 mg/dL — AB (ref 0–99)
TRIGLYCERIDES: 135 mg/dL (ref <150)
Total CHOL/HDL Ratio: 2.6 Ratio
VLDL: 27 mg/dL (ref 0–40)

## 2015-01-20 MED ORDER — LISINOPRIL 10 MG PO TABS: 10.0000 mg | ORAL_TABLET | Freq: Every day | ORAL | Status: DC

## 2015-01-20 NOTE — Progress Notes (Signed)
   Subjective:    Patient ID: Amanda Curry, female    DOB: Jun 19, 1951, 64 y.o.   MRN: [id number]  Chief Complaint  Patient presents with  . Hyperlipidemia    wants bloodwork   Patient Active Problem List   Diagnosis Date Noted  . HTN (hypertension) 03/21/2013  . Other and unspecified hyperlipidemia 03/21/2013   Prior to Admission medications   Medication Sig Start Date End Date Taking? Authorizing Provider  fish oil-omega-3 fatty acids 1000 MG capsule Take 2 g by mouth daily.   Yes Historical Provider, MD  glucosamine-chondroitin 500-400 MG tablet Take 1 tablet by mouth 3 (three) times daily.   Yes Historical Provider, MD  lisinopril (PRINIVIL,ZESTRIL) 10 MG tablet Take 1 tablet (10 mg total) by mouth daily. 01/20/15  Yes Rahkeem Senft, PA  Melatonin 3 MG TABS Take 1 tablet by mouth at bedtime as needed (sleep).   Yes Historical Provider, MD  Multiple Vitamin (MULTIVITAMIN WITH MINERALS) TABS Take 1 tablet by mouth daily.   Yes Historical Provider, MD  Pitavastatin Calcium (LIVALO) 2 MG TABS Take 1 tablet (2 mg total) by mouth daily. Patient not taking: Reported on 01/21/2015 05/03/14   [name], MD   Medications, allergies, past medical history, surgical history, family history, social history and problem list reviewed and updated.  HPI  54 yof with pmh htn, high cho presents wanting cho drawn.   Has hx high cho. Has been on diff statins through the years. Recently on 2 mg livalo but cutback to 1 mg on own due to achy feet bilaterally with the 2 mg dose. She wants her cho checked on 1 mg dose.   Also needs lisin refilled. BP well controlled today. She doesn't check bp at home. She does not exercise but planning to start. She tries to watch salt intake.   Review of Systems No cp, sob, vision changes, ha, presyncope, syncope.     Objective:   Physical Exam  Constitutional: She appears well-developed and well-nourished.  Non-toxic appearance. She does not have a sickly  appearance. She does not appear ill. No distress.  BP 115/79 mmHg  Pulse 81  Temp(Src) 97.9 F (36.6 C) (Oral)  Resp 18  Wt 134 lb (60.782 kg)  SpO2 97%   Cardiovascular: Normal rate, regular rhythm and normal heart sounds.  Exam reveals no gallop.   No murmur heard. Pulmonary/Chest: Effort normal and breath sounds normal.      Assessment & Plan:   4 yof with pmh htn, high cho presents wanting cho drawn.   Hypercholesteremia - Plan: Lipid panel --checking cho today --continue statin  Essential hypertension - Plan: lisinopril (PRINIVIL,ZESTRIL) 10 MG tablet --refilled lisin --normal bun/cr/k 9 months ago  Julieta Gutting, PA-C Physician Assistant-Certified [facility]  01/21/2015 12:48 PM

## 2015-01-20 NOTE — Patient Instructions (Signed)
Hypertension Hypertension, commonly called high blood pressure, is when the force of blood pumping through your arteries is too strong. Your arteries are the blood vessels that carry blood from your heart throughout your body. A blood pressure reading consists of a higher number over a lower number, such as 110/72. The higher number (systolic) is the pressure inside your arteries when your heart pumps. The lower number (diastolic) is the pressure inside your arteries when your heart relaxes. Ideally you want your blood pressure below 120/80. Hypertension forces your heart to work harder to pump blood. Your arteries may become narrow or stiff. Having hypertension puts you at risk for heart disease, stroke, and other problems.  RISK FACTORS Some risk factors for high blood pressure are controllable. Others are not.  Risk factors you cannot control include:   Race. You may be at higher risk if you are African American.  Age. Risk increases with age.  Gender. Men are at higher risk than women before age 45 years. After age 65, women are at higher risk than men. Risk factors you can control include:  Not getting enough exercise or physical activity.  Being overweight.  Getting too much fat, sugar, calories, or salt in your diet.  Drinking too much alcohol. SIGNS AND SYMPTOMS Hypertension does not usually cause signs or symptoms. Extremely high blood pressure (hypertensive crisis) may cause headache, anxiety, shortness of breath, and nosebleed. DIAGNOSIS  To check if you have hypertension, your health care provider will measure your blood pressure while you are seated, with your arm held at the level of your heart. It should be measured at least twice using the same arm. Certain conditions can cause a difference in blood pressure between your right and left arms. A blood pressure reading that is higher than normal on one occasion does not mean that you need treatment. If one blood pressure reading  is high, ask your health care provider about having it checked again. TREATMENT  Treating high blood pressure includes making lifestyle changes and possibly taking medicine. Living a healthy lifestyle can help lower high blood pressure. You may need to change some of your habits. Lifestyle changes may include:  Following the DASH diet. This diet is high in fruits, vegetables, and whole grains. It is low in salt, red meat, and added sugars.  Getting at least 2 hours of brisk physical activity every week.  Losing weight if necessary.  Not smoking.  Limiting alcoholic beverages.  Learning ways to reduce stress. If lifestyle changes are not enough to get your blood pressure under control, your health care provider may prescribe medicine. You may need to take more than one. Work closely with your health care provider to understand the risks and benefits. HOME CARE INSTRUCTIONS  Have your blood pressure rechecked as directed by your health care provider.   Take medicines only as directed by your health care provider. Follow the directions carefully. Blood pressure medicines must be taken as prescribed. The medicine does not work as well when you skip doses. Skipping doses also puts you at risk for problems.   Do not smoke.   Monitor your blood pressure at home as directed by your health care provider. SEEK MEDICAL CARE IF:   You think you are having a reaction to medicines taken.  You have recurrent headaches or feel dizzy.  You have swelling in your ankles.  You have trouble with your vision. SEEK IMMEDIATE MEDICAL CARE IF:  You develop a severe headache or confusion.    You have unusual weakness, numbness, or feel faint.  You have severe chest or abdominal pain.  You vomit repeatedly.  You have trouble breathing. MAKE SURE YOU:   Understand these instructions.  Will watch your condition.  Will get help right away if you are not doing well or get worse. Document  Released: 08/15/2005 Document Revised: 12/30/2013 Document Reviewed: 06/07/2013 [facility] Patient Information 2015 [place], [place]. This information is not intended to replace advice given to you by your health care provider. Make sure you discuss any questions you have with your health care provider.   High Cholesterol High cholesterol refers to having a high level of cholesterol in your blood. Cholesterol is a white, waxy, fat-like protein that your body needs in small amounts. Your liver makes all the cholesterol you need. Excess cholesterol comes from the food you eat. Cholesterol travels in your bloodstream through your blood vessels. If you have high cholesterol, deposits (plaque) may build up on the walls of your blood vessels. This makes the arteries narrower and stiffer. Plaque increases your risk of heart attack and stroke. Work with your health care provider to keep your cholesterol levels in a healthy range. RISK FACTORS Several things can make you more likely to have high cholesterol. These include:   Eating foods high in animal fat (saturated fat) or cholesterol.  Being overweight.  Not getting enough exercise.  Having a family history of high cholesterol. SIGNS AND SYMPTOMS High cholesterol does not cause symptoms. DIAGNOSIS  Your health care provider can do a blood test to check whether you have high cholesterol. If you are older than 20, your health care provider may check your cholesterol every 4-6 years. You may be checked more often if you already have high cholesterol or other risk factors for heart disease. The blood test for cholesterol measures the following:  Bad cholesterol (LDL cholesterol). This is the type of cholesterol that causes heart disease. This number should be less than 100.  Good cholesterol (HDL cholesterol). This type helps protect against heart disease. A healthy level of HDL cholesterol is 60 or higher.  Total cholesterol. This is the combined  number of LDL cholesterol and HDL cholesterol. A healthy number is less than 200. TREATMENT  High cholesterol can be treated with diet changes, lifestyle changes, and medicine.   Diet changes may include eating more whole grains, fruits, vegetables, nuts, and fish. You may also have to cut back on red meat and foods with a lot of added sugar.  Lifestyle changes may include getting at least 40 minutes of aerobic exercise three times a week. Aerobic exercises include walking, biking, and swimming. Aerobic exercise along with a healthy diet can help you maintain a healthy weight. Lifestyle changes may also include quitting smoking.  If diet and lifestyle changes are not enough to lower your cholesterol, your health care provider may prescribe a statin medicine. This medicine has been shown to lower cholesterol and also lower the risk of heart disease. HOME CARE INSTRUCTIONS  Only take over-the-counter or prescription medicines as directed by your health care provider.   Follow a healthy diet as directed by your health care provider. For instance:   Eat chicken (without skin), fish, veal, shellfish, ground [place] breast, and round or loin cuts of red meat.  Do not eat fried foods and fatty meats, such as hot dogs and salami.   Eat plenty of fruits, such as apples.   Eat plenty of vegetables, such as broccoli, potatoes, and carrots.  Eat beans, peas, and lentils.   Eat grains, such as barley, rice, couscous, and bulgur wheat.   Eat pasta without cream sauces.   Use skim or nonfat milk and low-fat or nonfat yogurt and cheeses. Do not eat or drink whole milk, cream, ice cream, egg yolks, and hard cheeses.   Do not eat stick margarine or tub margarines that contain trans fats (also called partially hydrogenated oils).   Do not eat cakes, cookies, crackers, or other baked goods that contain trans fats.   Do not eat saturated tropical oils, such as coconut and palm oil.    Exercise as directed by your health care provider. Increase your activity level with activities such as gardening or walking.   Keep all follow-up appointments.  SEEK MEDICAL CARE IF:  You are struggling to maintain a healthy diet or weight.  You need help starting an exercise program.  You need help to stop smoking. SEEK IMMEDIATE MEDICAL CARE IF:  You have chest pain.  You have trouble breathing. Document Released: 08/15/2005 Document Revised: 12/30/2013 Document Reviewed: 06/07/2013 [facility] Patient Information 2015 [place], [place]. This information is not intended to replace advice given to you by your health care provider. Make sure you discuss any questions you have with your health care provider.

## 2015-01-21 ENCOUNTER — Encounter: Payer: Self-pay | Admitting: Physician Assistant

## 2015-01-21 ENCOUNTER — Encounter: Payer: Self-pay | Admitting: Family Medicine

## 2015-02-09 ENCOUNTER — Encounter: Payer: Self-pay | Admitting: *Deleted

## 2015-05-29 ENCOUNTER — Other Ambulatory Visit: Payer: Self-pay | Admitting: Internal Medicine

## 2015-06-04 ENCOUNTER — Ambulatory Visit (INDEPENDENT_AMBULATORY_CARE_PROVIDER_SITE_OTHER): Payer: BLUE CROSS/BLUE SHIELD | Admitting: Family Medicine

## 2015-06-04 VITALS — BP 142/80 | HR 100 | Temp 98.7°F | Resp 16 | Ht 62.0 in | Wt 136.0 lb

## 2015-06-04 DIAGNOSIS — N8110 Cystocele, unspecified: Secondary | ICD-10-CM

## 2015-06-04 DIAGNOSIS — L298 Other pruritus: Secondary | ICD-10-CM

## 2015-06-04 DIAGNOSIS — R030 Elevated blood-pressure reading, without diagnosis of hypertension: Secondary | ICD-10-CM

## 2015-06-04 DIAGNOSIS — N390 Urinary tract infection, site not specified: Secondary | ICD-10-CM | POA: Diagnosis not present

## 2015-06-04 DIAGNOSIS — I10 Essential (primary) hypertension: Secondary | ICD-10-CM | POA: Diagnosis not present

## 2015-06-04 DIAGNOSIS — B3731 Acute candidiasis of vulva and vagina: Secondary | ICD-10-CM

## 2015-06-04 DIAGNOSIS — IMO0002 Reserved for concepts with insufficient information to code with codable children: Secondary | ICD-10-CM

## 2015-06-04 DIAGNOSIS — R8281 Pyuria: Secondary | ICD-10-CM

## 2015-06-04 DIAGNOSIS — B373 Candidiasis of vulva and vagina: Secondary | ICD-10-CM | POA: Diagnosis not present

## 2015-06-04 DIAGNOSIS — N898 Other specified noninflammatory disorders of vagina: Secondary | ICD-10-CM

## 2015-06-04 DIAGNOSIS — IMO0001 Reserved for inherently not codable concepts without codable children: Secondary | ICD-10-CM

## 2015-06-04 LAB — POCT WET + KOH PREP
Trich by wet prep: ABSENT
Yeast by wet prep: ABSENT

## 2015-06-04 LAB — POC MICROSCOPIC URINALYSIS (UMFC): MUCUS RE: ABSENT

## 2015-06-04 LAB — POCT URINALYSIS DIP (MANUAL ENTRY)
BILIRUBIN UA: NEGATIVE
Glucose, UA: NEGATIVE
Ketones, POC UA: NEGATIVE
NITRITE UA: NEGATIVE
PH UA: 7
Protein Ur, POC: NEGATIVE
Spec Grav, UA: 1.01
UROBILINOGEN UA: 0.2

## 2015-06-04 MED ORDER — FLUCONAZOLE 150 MG PO TABS: 150.0000 mg | ORAL_TABLET | Freq: Once | ORAL | Status: DC

## 2015-06-04 NOTE — Patient Instructions (Signed)
Take the Diflucan one pill today and repeat on Saturday. You have one refill if you need beyond that if symptoms seem to have reoccurred.  We will culture the urine and let you know if there is any evidence of a bacterial infection on top of this  Monitor your blood pressure carefully. Goal is to be consistently less than 140/90, with ideal pressures down toward 120/70.

## 2015-06-04 NOTE — Progress Notes (Signed)
Patient ID: Amanda Curry, female    DOB: [birth year]  Age: 64 y.o. MRN: [id number]  Chief Complaint  Patient presents with  . possible yeast infection    x 1 week  . Vaginal Itching    burning/ little discharge x 1 week  . Hypertension    174/98    Subjective:  Patient is here complaining of vaginal itching and irritation. Her blood pressure was also. She says she has whitecoat hypertension. No other cardiovascular symptoms. Takes her medicine faithfully.  2 years ago I treated her for yeast vaginitis and she has not had one since.  Current allergies, medications, problem list, past/family and social histories reviewed.  Objective:  BP 142/80 mmHg  Pulse 100  Temp(Src) 98.7 F (37.1 C) (Oral)  Resp 16  Ht 5\' 2"  (1.575 m)  Wt 136 lb (61.689 kg)  BMI 24.87 kg/m2  SpO2 97%  Blood pressure on repeat was 190/100. She does have a cuff at home but doesn't use it. Her chest is clear. Heart regular without murmur, little tachycardic on auscultation. Right not retaken. Abdomen soft. Normal external genitalia. Vaginal mucosa reveals a prominent cystocele. She does have a pessary in place. Bimanual exam feels no adnexal or uterine masses. Wet prep was taken. Assessment & Plan:   Assessment: 1. Vagina itching   2. Cystocele   3. Monilial vaginitis   4. Pyuria   5. Elevated blood pressure   6. HTN (hypertension), benign       Plan:  Itching and irritation-will check a urinalysis and wet prep  Orders Placed This Encounter  Procedures  . Urine culture  . POCT Microscopic Urinalysis ([facility])  . POCT urinalysis dipstick  . POCT Wet + KOH Prep ([facility])   Results for orders placed or performed in visit on 06/04/15  POCT Microscopic Urinalysis ([facility])  Result Value Ref Range   WBC,UR,HPF,POC Many (A) None WBC/hpf   RBC,UR,HPF,POC Few (A) None RBC/hpf   Bacteria Moderate (A) None   Mucus Absent Absent   Epithelial Cells, UR Per Microscopy Few (A) None cells/hpf  POCT urinalysis  dipstick  Result Value Ref Range   Color, UA yellow yellow   Clarity, UA clear clear   Glucose, UA negative negative   Bilirubin, UA negative negative   Ketones, POC UA negative negative   Spec Grav, UA 1.010    Blood, UA small (A) negative   pH, UA 7.0    Protein Ur, POC negative negative   Urobilinogen, UA 0.2    Nitrite, UA Negative Negative   Leukocytes, UA large (3+) (A) Negative  POCT Wet + KOH Prep ([facility])  Result Value Ref Range   Yeast by KOH Present Present, Absent   Yeast by wet prep Absent Present, Absent   WBC by wet prep Few None, Few   Clue Cells Wet Prep HPF POC None None   Trich by wet prep Absent Present, Absent   Bacteria Wet Prep HPF POC Many (A) None, Few   Epithelial Cells By [employer] Pref ([facility]) Many (A) None, Few   RBC,UR,HPF,POC None None RBC/hpf       Patient Instructions  Take the Diflucan one pill today and repeat on Saturday. You have one refill if you need beyond that if symptoms seem to have reoccurred.  We will culture the urine and let you know if there is any evidence of a bacterial infection on top of this  Monitor your blood pressure carefully. Goal is to be consistently less than  140/90, with ideal pressures down toward 120/70.     Return if symptoms worsen or fail to improve.   HOPPER,DAVID, MD 06/04/2015

## 2015-06-05 LAB — URINE CULTURE
COLONY COUNT: NO GROWTH
ORGANISM ID, BACTERIA: NO GROWTH

## 2015-06-09 ENCOUNTER — Ambulatory Visit (INDEPENDENT_AMBULATORY_CARE_PROVIDER_SITE_OTHER): Payer: BLUE CROSS/BLUE SHIELD | Admitting: Emergency Medicine

## 2015-06-09 VITALS — BP 172/90 | HR 100 | Temp 98.3°F | Resp 18 | Ht 62.0 in | Wt 136.0 lb

## 2015-06-09 DIAGNOSIS — I10 Essential (primary) hypertension: Secondary | ICD-10-CM

## 2015-06-09 MED ORDER — LISINOPRIL 30 MG PO TABS: 30.0000 mg | ORAL_TABLET | Freq: Every day | ORAL | Status: DC

## 2015-06-09 MED ORDER — HYDROCHLOROTHIAZIDE 25 MG PO TABS: 25.0000 mg | ORAL_TABLET | Freq: Every day | ORAL | Status: DC

## 2015-06-09 NOTE — Patient Instructions (Signed)
Hypertension Hypertension, commonly called high blood pressure, is when the force of blood pumping through your arteries is too strong. Your arteries are the blood vessels that carry blood from your heart throughout your body. A blood pressure reading consists of a higher number over a lower number, such as 110/72. The higher number (systolic) is the pressure inside your arteries when your heart pumps. The lower number (diastolic) is the pressure inside your arteries when your heart relaxes. Ideally you want your blood pressure below 120/80. Hypertension forces your heart to work harder to pump blood. Your arteries may become narrow or stiff. Having untreated or uncontrolled hypertension can cause heart attack, stroke, kidney disease, and other problems. RISK FACTORS Some risk factors for high blood pressure are controllable. Others are not.  Risk factors you cannot control include:   Race. You may be at higher risk if you are African American.  Age. Risk increases with age.  Gender. Men are at higher risk than women before age 45 years. After age 65, women are at higher risk than men. Risk factors you can control include:  Not getting enough exercise or physical activity.  Being overweight.  Getting too much fat, sugar, calories, or salt in your diet.  Drinking too much alcohol. SIGNS AND SYMPTOMS Hypertension does not usually cause signs or symptoms. Extremely high blood pressure (hypertensive crisis) may cause headache, anxiety, shortness of breath, and nosebleed. DIAGNOSIS To check if you have hypertension, your health care provider will measure your blood pressure while you are seated, with your arm held at the level of your heart. It should be measured at least twice using the same arm. Certain conditions can cause a difference in blood pressure between your right and left arms. A blood pressure reading that is higher than normal on one occasion does not mean that you need treatment. If  it is not clear whether you have high blood pressure, you may be asked to return on a different day to have your blood pressure checked again. Or, you may be asked to monitor your blood pressure at home for 1 or more weeks. TREATMENT Treating high blood pressure includes making lifestyle changes and possibly taking medicine. Living a healthy lifestyle can help lower high blood pressure. You may need to change some of your habits. Lifestyle changes may include:  Following the DASH diet. This diet is high in fruits, vegetables, and whole grains. It is low in salt, red meat, and added sugars.  Keep your sodium intake below 2,300 mg per day.  Getting at least 30-45 minutes of aerobic exercise at least 4 times per week.  Losing weight if necessary.  Not smoking.  Limiting alcoholic beverages.  Learning ways to reduce stress. Your health care provider may prescribe medicine if lifestyle changes are not enough to get your blood pressure under control, and if one of the following is true:  You are 18-59 years of age and your systolic blood pressure is above 140.  You are 60 years of age or older, and your systolic blood pressure is above 150.  Your diastolic blood pressure is above 90.  You have diabetes, and your systolic blood pressure is over 140 or your diastolic blood pressure is over 90.  You have kidney disease and your blood pressure is above 140/90.  You have heart disease and your blood pressure is above 140/90. Your personal target blood pressure may vary depending on your medical conditions, your age, and other factors. HOME CARE INSTRUCTIONS    Have your blood pressure rechecked as directed by your health care provider.   Take medicines only as directed by your health care provider. Follow the directions carefully. Blood pressure medicines must be taken as prescribed. The medicine does not work as well when you skip doses. Skipping doses also puts you at risk for  problems.  Do not smoke.   Monitor your blood pressure at home as directed by your health care provider. SEEK MEDICAL CARE IF:   You think you are having a reaction to medicines taken.  You have recurrent headaches or feel dizzy.  You have swelling in your ankles.  You have trouble with your vision. SEEK IMMEDIATE MEDICAL CARE IF:  You develop a severe headache or confusion.  You have unusual weakness, numbness, or feel faint.  You have severe chest or abdominal pain.  You vomit repeatedly.  You have trouble breathing. MAKE SURE YOU:   Understand these instructions.  Will watch your condition.  Will get help right away if you are not doing well or get worse.   This information is not intended to replace advice given to you by your health care provider. Make sure you discuss any questions you have with your health care provider.   Document Released: 08/15/2005 Document Revised: 12/30/2014 Document Reviewed: 06/07/2013 Elsevier Interactive Patient Education 2016 [employer]  

## 2015-06-09 NOTE — Progress Notes (Signed)
Subjective:  Patient ID: Amanda Curry, female    DOB: Nov 04, 1950  Age: 64 y.o. MRN: [id number]  CC: Follow-up and Hypertension   HPI Amanda Curry presents   Patient comes with a history of hypertension she's been taking lisinopril 10  Milligrams a day. she is asymptomatic and has no evidence of an endorgan injury. She's tolerating the medication well with no adverse effect.  History Amanda Curry has a past medical history of Hypertension; Hyperlipidemia; Arthritis; Ganglion of left wrist; Diverticulosis; and Tinnitus.   She has past surgical history that includes Tubal ligation and Tonsillectomy.   Her  family history includes Alzheimer's disease in her mother; Colon cancer in her maternal grandmother; Hypertension in her sister; Prostate cancer in her father.  She   reports that she has never smoked. She has never used smokeless tobacco. She reports that she drinks alcohol. She reports that she does not use illicit drugs.  Outpatient Prescriptions Prior to Visit  Medication Sig Dispense Refill  . fish oil-omega-3 fatty acids 1000 MG capsule Take 2 g by mouth daily.    . fluconazole (DIFLUCAN) 150 MG tablet Take 1 tablet (150 mg total) by mouth once. Repeat if needed 2 tablet 1  . glucosamine-chondroitin 500-400 MG tablet Take 1 tablet by mouth 3 (three) times daily.    . Melatonin 3 MG TABS Take 1 tablet by mouth at bedtime as needed (sleep).    . Multiple Vitamin (MULTIVITAMIN WITH MINERALS) TABS Take 1 tablet by mouth daily.    . Pitavastatin Calcium (LIVALO) 2 MG TABS TAKE 1 TABLET ONCE DAILY 30 tablet 1  . lisinopril (PRINIVIL,ZESTRIL) 10 MG tablet Take 1 tablet (10 mg total) by mouth daily. 90 tablet 3   No facility-administered medications prior to visit.    Social History   Social History  . Marital Status: Married    Spouse Name: [name]  . Number of Children: 4  . Years of Education: N/A   Occupational History  . 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 Smoking status: Never Smoker   . Smokeless tobacco: Never Used  . Alcohol Use: Yes     Comment: rarely  . Drug Use: No  . Sexual Activity: Yes   Other Topics Concern  . None   Social History Narrative   Lives with her husband.   Adult children. Two live in [place].     Review of Systems  Constitutional: Negative for fever, chills and appetite change.  HENT: Negative for congestion, ear pain, postnasal drip, sinus pressure and sore throat.   Eyes: Negative for pain and redness.  Respiratory: Negative for cough, shortness of breath and wheezing.   Cardiovascular: Negative for leg swelling.  Gastrointestinal: Negative for nausea, vomiting, abdominal pain, diarrhea, constipation and blood in stool.  Endocrine: Negative for polyuria.  Genitourinary: Negative for dysuria, urgency, frequency and flank pain.  Musculoskeletal: Negative for gait problem.  Skin: Negative for rash.  Neurological: Negative for weakness and headaches.  Psychiatric/Behavioral: Negative for confusion and decreased concentration. The patient is not nervous/anxious.     Objective:  BP 172/90 mmHg  Pulse 100  Temp(Src) 98.3 F (36.8 C) (Oral)  Resp 18  Ht 5\' 2"  (1.575 m)  Wt 136 lb (61.689 kg)  BMI 24.87 kg/m2  SpO2 99%  Physical Exam  Constitutional: She is oriented to person, place, and time. She appears well-developed and well-nourished.  HENT:  Head: Normocephalic and atraumatic.  Eyes: Conjunctivae are normal. Pupils are equal, round, and reactive to light.  Pulmonary/Chest: Effort normal.  Musculoskeletal: She exhibits no edema.  Neurological: She is alert and oriented to person, place, and time.  Skin: Skin is dry.  Psychiatric: She has a normal mood and affect. Her behavior is normal. Thought content normal.      Assessment & Plan:   Amanda Curry was seen today for follow-up and hypertension.  Diagnoses and all orders for this visit:  Essential  hypertension, benign  Essential hypertension -     lisinopril (PRINIVIL,ZESTRIL) 30 MG tablet; Take 1 tablet (30 mg total) by mouth daily.  Other orders -     hydrochlorothiazide (HYDRODIURIL) 25 MG tablet; Take 1 tablet (25 mg total) by mouth daily.   I have changed Amanda Curry's lisinopril. I am also having her start on hydrochlorothiazide. Additionally, I am having her maintain her Melatonin, fish oil-omega-3 fatty acids, multivitamin with minerals, glucosamine-chondroitin, Pitavastatin Calcium, and fluconazole.  Meds ordered this encounter  Medications  . lisinopril (PRINIVIL,ZESTRIL) 30 MG tablet    Sig: Take 1 tablet (30 mg total) by mouth daily.    Dispense:  30 tablet    Refill:  2  . hydrochlorothiazide (HYDRODIURIL) 25 MG tablet    Sig: Take 1 tablet (25 mg total) by mouth daily.    Dispense:  90 tablet    Refill:  3    Appropriate red flag conditions were discussed with the patient as well as actions that should be taken.  Patient expressed his understanding.  Follow-up: Return in 1 month (on 07/10/2015).  Roselee Culver, MD

## 2015-07-22 ENCOUNTER — Ambulatory Visit (INDEPENDENT_AMBULATORY_CARE_PROVIDER_SITE_OTHER): Payer: BLUE CROSS/BLUE SHIELD | Admitting: Emergency Medicine

## 2015-07-22 VITALS — BP 160/88 | HR 88 | Temp 98.5°F | Resp 18 | Ht 62.5 in | Wt 138.0 lb

## 2015-07-22 DIAGNOSIS — E876 Hypokalemia: Secondary | ICD-10-CM

## 2015-07-22 DIAGNOSIS — I10 Essential (primary) hypertension: Secondary | ICD-10-CM

## 2015-07-22 DIAGNOSIS — E7800 Pure hypercholesterolemia, unspecified: Secondary | ICD-10-CM

## 2015-07-22 LAB — COMPREHENSIVE METABOLIC PANEL
ALT: 18 U/L (ref 6–29)
AST: 24 U/L (ref 10–35)
Albumin: 4.2 g/dL (ref 3.6–5.1)
Alkaline Phosphatase: 54 U/L (ref 33–130)
BUN: 15 mg/dL (ref 7–25)
CHLORIDE: 90 mmol/L — AB (ref 98–110)
CO2: 28 mmol/L (ref 20–31)
CREATININE: 0.52 mg/dL (ref 0.50–0.99)
Calcium: 9.1 mg/dL (ref 8.6–10.4)
GLUCOSE: 93 mg/dL (ref 65–99)
Potassium: 3.7 mmol/L (ref 3.5–5.3)
SODIUM: 126 mmol/L — AB (ref 135–146)
Total Bilirubin: 0.4 mg/dL (ref 0.2–1.2)
Total Protein: 6.7 g/dL (ref 6.1–8.1)

## 2015-07-22 LAB — CBC
HCT: 34.4 % — ABNORMAL LOW (ref 36.0–46.0)
Hemoglobin: 12 g/dL (ref 12.0–15.0)
MCH: 30 pg (ref 26.0–34.0)
MCHC: 34.9 g/dL (ref 30.0–36.0)
MCV: 86 fL (ref 78.0–100.0)
MPV: 8.8 fL (ref 8.6–12.4)
PLATELETS: 331 10*3/uL (ref 150–400)
RBC: 4 MIL/uL (ref 3.87–5.11)
RDW: 13.4 % (ref 11.5–15.5)
WBC: 6.2 10*3/uL (ref 4.0–10.5)

## 2015-07-22 LAB — LIPID PANEL
Cholesterol: 193 mg/dL (ref 125–200)
HDL: 78 mg/dL (ref >=46)
LDL CALC: 89 mg/dL (ref <130)
Total CHOL/HDL Ratio: 2.5 Ratio (ref <=5.0)
Triglycerides: 128 mg/dL (ref <150)
VLDL: 26 mg/dL (ref <30)

## 2015-07-22 LAB — TSH: TSH: 3.802 u[IU]/mL (ref 0.350–4.500)

## 2015-07-22 MED ORDER — AMLODIPINE BESYLATE 2.5 MG PO TABS: 2.5000 mg | ORAL_TABLET | Freq: Every day | ORAL | Status: DC

## 2015-07-22 NOTE — Progress Notes (Signed)
Subjective:  Patient ID: Amanda Curry, female    DOB: 08-Jan-1951  Age: 64 y.o. MRN: [id number]  CC: Follow-up   HPI Amanda Curry presents  with a history of hypertension she's come in today to have her blood pressure check. She has brought a blood pressure log that shows her blood pressure is not well controlled the majority of the readings being between [id number] and [id number] systolic. She has no peripheral edema chest pain tightness heaviness pressure shortness of breath. Tolerating medication is no peripheral edema  History Amanda Curry has a past medical history of Hypertension; Hyperlipidemia; Arthritis; Ganglion of left wrist; Diverticulosis; and Tinnitus.   She has past surgical history that includes Tubal ligation and Tonsillectomy.   Her  family history includes Alzheimer's disease in her mother; Colon cancer in her maternal grandmother; Hypertension in her sister; Prostate cancer in her father.  She   reports that she has never smoked. She has never used smokeless tobacco. She reports that she drinks alcohol. She reports that she does not use illicit drugs.  Outpatient Prescriptions Prior to Visit  Medication Sig Dispense Refill  . fish oil-omega-3 fatty acids 1000 MG capsule Take 2 g by mouth daily.    Marland Kitchen glucosamine-chondroitin 500-400 MG tablet Take 1 tablet by mouth 3 (three) times daily.    . hydrochlorothiazide (HYDRODIURIL) 25 MG tablet Take 1 tablet (25 mg total) by mouth daily. 90 tablet 3  . lisinopril (PRINIVIL,ZESTRIL) 30 MG tablet Take 1 tablet (30 mg total) by mouth daily. 30 tablet 2  . Melatonin 3 MG TABS Take 1 tablet by mouth at bedtime as needed (sleep).    . Multiple Vitamin (MULTIVITAMIN WITH MINERALS) TABS Take 1 tablet by mouth daily.    . Pitavastatin Calcium (LIVALO) 2 MG TABS TAKE 1 TABLET ONCE DAILY 30 tablet 1  . fluconazole (DIFLUCAN) 150 MG tablet Take 1 tablet (150 mg total) by mouth once. Repeat if needed (Patient not taking: Reported on 07/22/2015) 2 tablet  1   No facility-administered medications prior to visit.    Social History   Social History  . Marital Status: Married    Spouse Name: Amanda Curry  . Number of Children: 4  . Years of Education: N/A   Occupational History  . 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 Smoking status: Never Smoker   . Smokeless tobacco: Never Used  . Alcohol Use: Yes     Comment: rarely  . Drug Use: No  . Sexual Activity: Yes   Other Topics Concern  . None   Social History Narrative   Lives with her husband.   Adult children. Two live in [place].     Review of Systems  Constitutional: Negative for fever, chills and appetite change.  HENT: Negative for congestion, ear pain, postnasal drip, sinus pressure and sore throat.   Eyes: Negative for pain and redness.  Respiratory: Negative for cough, shortness of breath and wheezing.   Cardiovascular: Negative for leg swelling.  Gastrointestinal: Negative for nausea, vomiting, abdominal pain, diarrhea, constipation and blood in stool.  Endocrine: Negative for polyuria.  Genitourinary: Negative for dysuria, urgency, frequency and flank pain.  Musculoskeletal: Negative for gait problem.  Skin: Negative for rash.  Neurological: Negative for weakness and headaches.  Psychiatric/Behavioral: Negative for confusion and decreased concentration. The patient is not nervous/anxious.     Objective:  BP 160/88 mmHg  Pulse 88  Temp(Src) 98.5 F (36.9 C)  Resp  18  Ht 5' 2.5" (1.588 m)  Wt 138 lb (62.596 kg)  BMI 24.82 kg/m2  SpO2 99%  Physical Exam  Constitutional: She is oriented to person, place, and time. She appears well-developed and well-nourished.  HENT:  Head: Normocephalic and atraumatic.  Eyes: Conjunctivae are normal. Pupils are equal, round, and reactive to light.  Cardiovascular: Normal rate, regular rhythm and normal heart sounds.   Pulmonary/Chest: Effort normal and breath sounds normal. No  respiratory distress.  Musculoskeletal: She exhibits no edema.  Neurological: She is alert and oriented to person, place, and time.  Skin: Skin is dry.  Psychiatric: She has a normal mood and affect. Her behavior is normal. Thought content normal.      Assessment & Plan:   Amanda Curry was seen today for follow-up.  Diagnoses and all orders for this visit:  Essential hypertension, benign -     CBC -     Comprehensive metabolic panel -     Lipid panel -     TSH -     US Renal Artery Stenosis; Future  Hypercholesteremia -     CBC -     Comprehensive metabolic panel -     Lipid panel -     TSH -     US Renal Artery Stenosis; Future  Other orders -     amLODipine (NORVASC) 2.5 MG tablet; Take 1 tablet (2.5 mg total) by mouth daily.   I am having Ms. Amanda Curry start on amLODipine. I am also having her maintain her Melatonin, fish oil-omega-3 fatty acids, multivitamin with minerals, glucosamine-chondroitin, Pitavastatin Calcium, fluconazole, lisinopril, and hydrochlorothiazide.  Meds ordered this encounter  Medications  . amLODipine (NORVASC) 2.5 MG tablet    Sig: Take 1 tablet (2.5 mg total) by mouth daily.    Dispense:  30 tablet    Refill:  2    Appropriate red flag conditions were discussed with the patient as well as actions that should be taken.  Patient expressed his understanding.  Follow-up: Return in about 1 month (around 08/21/2015).  Amanda Culver, MD

## 2015-07-22 NOTE — Patient Instructions (Signed)
Hypertension Hypertension, commonly called high blood pressure, is when the force of blood pumping through your arteries is too strong. Your arteries are the blood vessels that carry blood from your heart throughout your body. A blood pressure reading consists of a higher number over a lower number, such as 110/72. The higher number (systolic) is the pressure inside your arteries when your heart pumps. The lower number (diastolic) is the pressure inside your arteries when your heart relaxes. Ideally you want your blood pressure below 120/80. Hypertension forces your heart to work harder to pump blood. Your arteries may become narrow or stiff. Having untreated or uncontrolled hypertension can cause heart attack, stroke, kidney disease, and other problems. RISK FACTORS Some risk factors for high blood pressure are controllable. Others are not.  Risk factors you cannot control include:   Race. You may be at higher risk if you are African American.  Age. Risk increases with age.  Gender. Men are at higher risk than women before age 45 years. After age 65, women are at higher risk than men. Risk factors you can control include:  Not getting enough exercise or physical activity.  Being overweight.  Getting too much fat, sugar, calories, or salt in your diet.  Drinking too much alcohol. SIGNS AND SYMPTOMS Hypertension does not usually cause signs or symptoms. Extremely high blood pressure (hypertensive crisis) may cause headache, anxiety, shortness of breath, and nosebleed. DIAGNOSIS To check if you have hypertension, your health care provider will measure your blood pressure while you are seated, with your arm held at the level of your heart. It should be measured at least twice using the same arm. Certain conditions can cause a difference in blood pressure between your right and left arms. A blood pressure reading that is higher than normal on one occasion does not mean that you need treatment. If  it is not clear whether you have high blood pressure, you may be asked to return on a different day to have your blood pressure checked again. Or, you may be asked to monitor your blood pressure at home for 1 or more weeks. TREATMENT Treating high blood pressure includes making lifestyle changes and possibly taking medicine. Living a healthy lifestyle can help lower high blood pressure. You may need to change some of your habits. Lifestyle changes may include:  Following the DASH diet. This diet is high in fruits, vegetables, and whole grains. It is low in salt, red meat, and added sugars.  Keep your sodium intake below 2,300 mg per day.  Getting at least 30-45 minutes of aerobic exercise at least 4 times per week.  Losing weight if necessary.  Not smoking.  Limiting alcoholic beverages.  Learning ways to reduce stress. Your health care provider may prescribe medicine if lifestyle changes are not enough to get your blood pressure under control, and if one of the following is true:  You are 18-59 years of age and your systolic blood pressure is above 140.  You are 60 years of age or older, and your systolic blood pressure is above 150.  Your diastolic blood pressure is above 90.  You have diabetes, and your systolic blood pressure is over 140 or your diastolic blood pressure is over 90.  You have kidney disease and your blood pressure is above 140/90.  You have heart disease and your blood pressure is above 140/90. Your personal target blood pressure may vary depending on your medical conditions, your age, and other factors. HOME CARE INSTRUCTIONS    Have your blood pressure rechecked as directed by your health care provider.   Take medicines only as directed by your health care provider. Follow the directions carefully. Blood pressure medicines must be taken as prescribed. The medicine does not work as well when you skip doses. Skipping doses also puts you at risk for  problems.  Do not smoke.   Monitor your blood pressure at home as directed by your health care provider. SEEK MEDICAL CARE IF:   You think you are having a reaction to medicines taken.  You have recurrent headaches or feel dizzy.  You have swelling in your ankles.  You have trouble with your vision. SEEK IMMEDIATE MEDICAL CARE IF:  You develop a severe headache or confusion.  You have unusual weakness, numbness, or feel faint.  You have severe chest or abdominal pain.  You vomit repeatedly.  You have trouble breathing. MAKE SURE YOU:   Understand these instructions.  Will watch your condition.  Will get help right away if you are not doing well or get worse.   This information is not intended to replace advice given to you by your health care provider. Make sure you discuss any questions you have with your health care provider.   Document Released: 08/15/2005 Document Revised: 12/30/2014 Document Reviewed: 06/07/2013 Elsevier Interactive Patient Education 2016 [employer]  

## 2015-07-27 ENCOUNTER — Other Ambulatory Visit: Payer: Self-pay | Admitting: Emergency Medicine

## 2015-07-27 DIAGNOSIS — I10 Essential (primary) hypertension: Secondary | ICD-10-CM

## 2015-07-27 DIAGNOSIS — E7800 Pure hypercholesterolemia, unspecified: Secondary | ICD-10-CM

## 2015-07-28 NOTE — Addendum Note (Signed)
Addended by: Constance Goltz on: 07/28/2015 08:39 AM   Modules accepted: Orders

## 2015-07-29 ENCOUNTER — Telehealth: Payer: Self-pay

## 2015-07-29 NOTE — Telephone Encounter (Signed)
Pt called wondering if she should increase her Norvasc since she is stopping the HCT (see labs). Advised to hold off for now and monitor her BP's. If she notices her BP staying consistently elevated, to call us back and we would talk with the doctor about adjusting her BP med

## 2015-08-12 IMAGING — CR DG KNEE COMPLETE 4+V*R*
3 series · 3 of 3 positions shown · non-contrast
Comparison: 02/10/2007

CLINICAL DATA: Right knee pain.  No known injury.

EXAM:
RIGHT KNEE - COMPLETE 4+ VIEW

[AP]
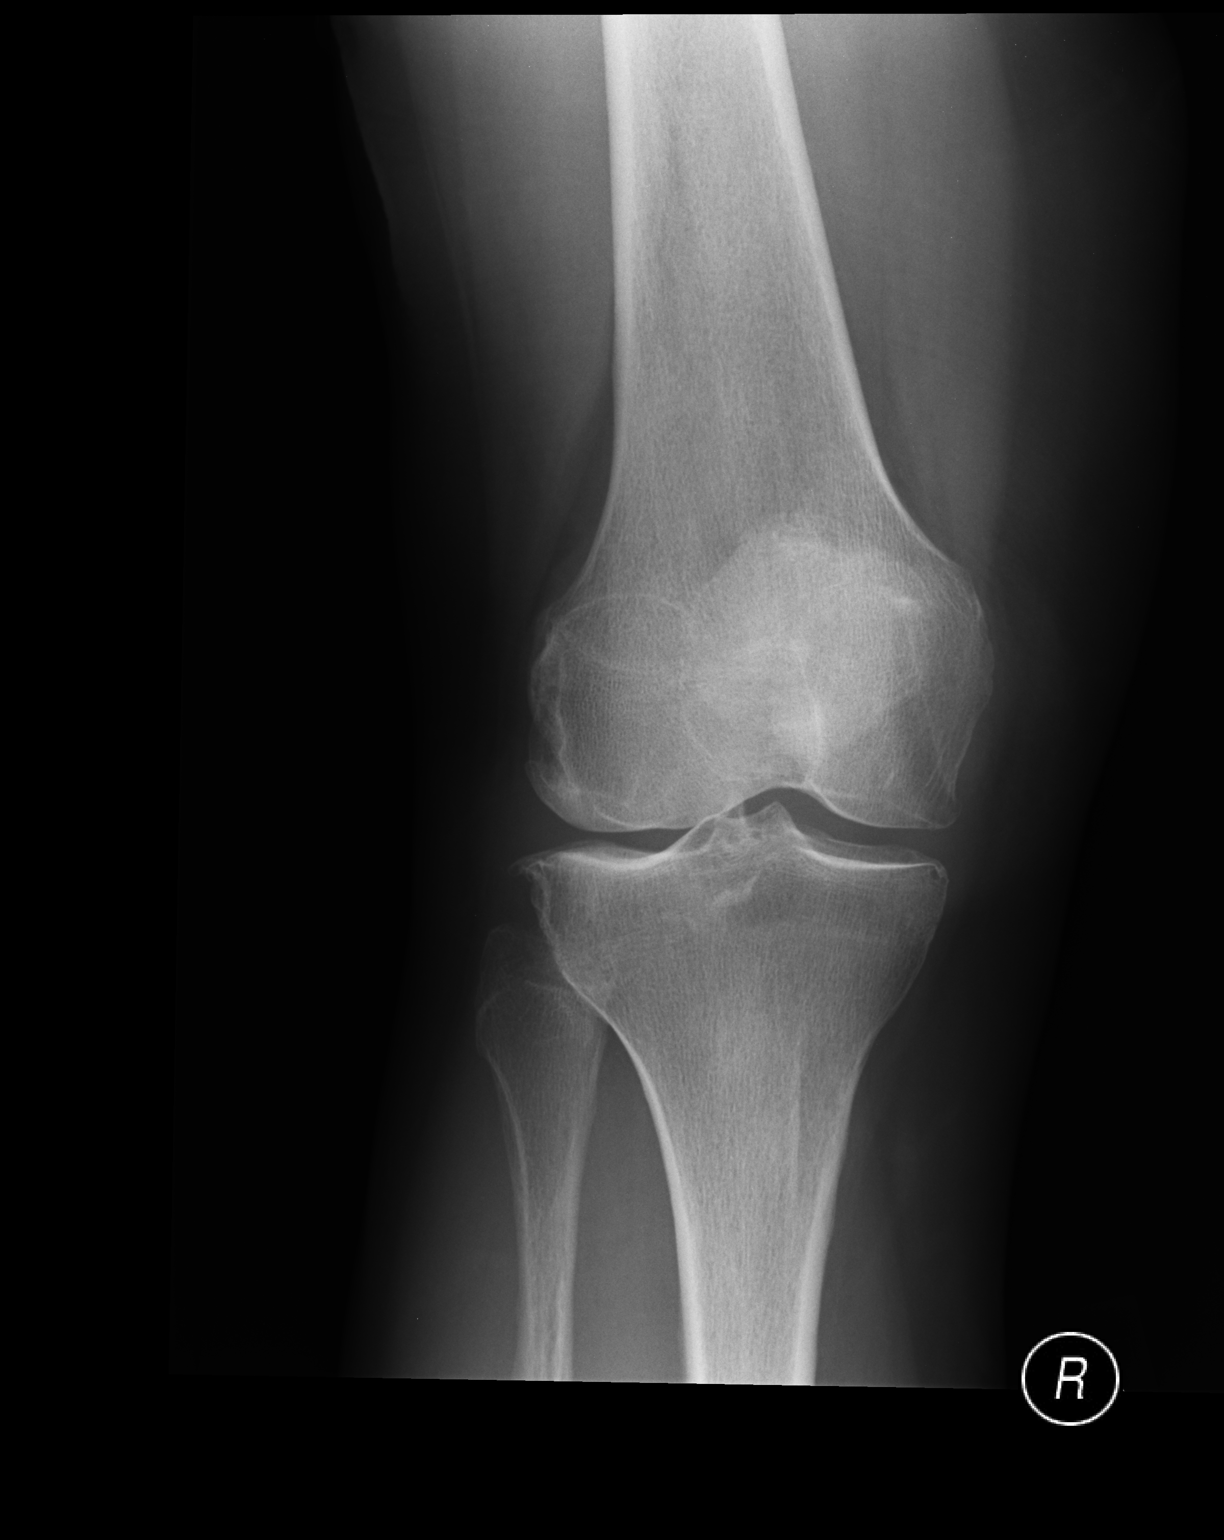

[sunrise]
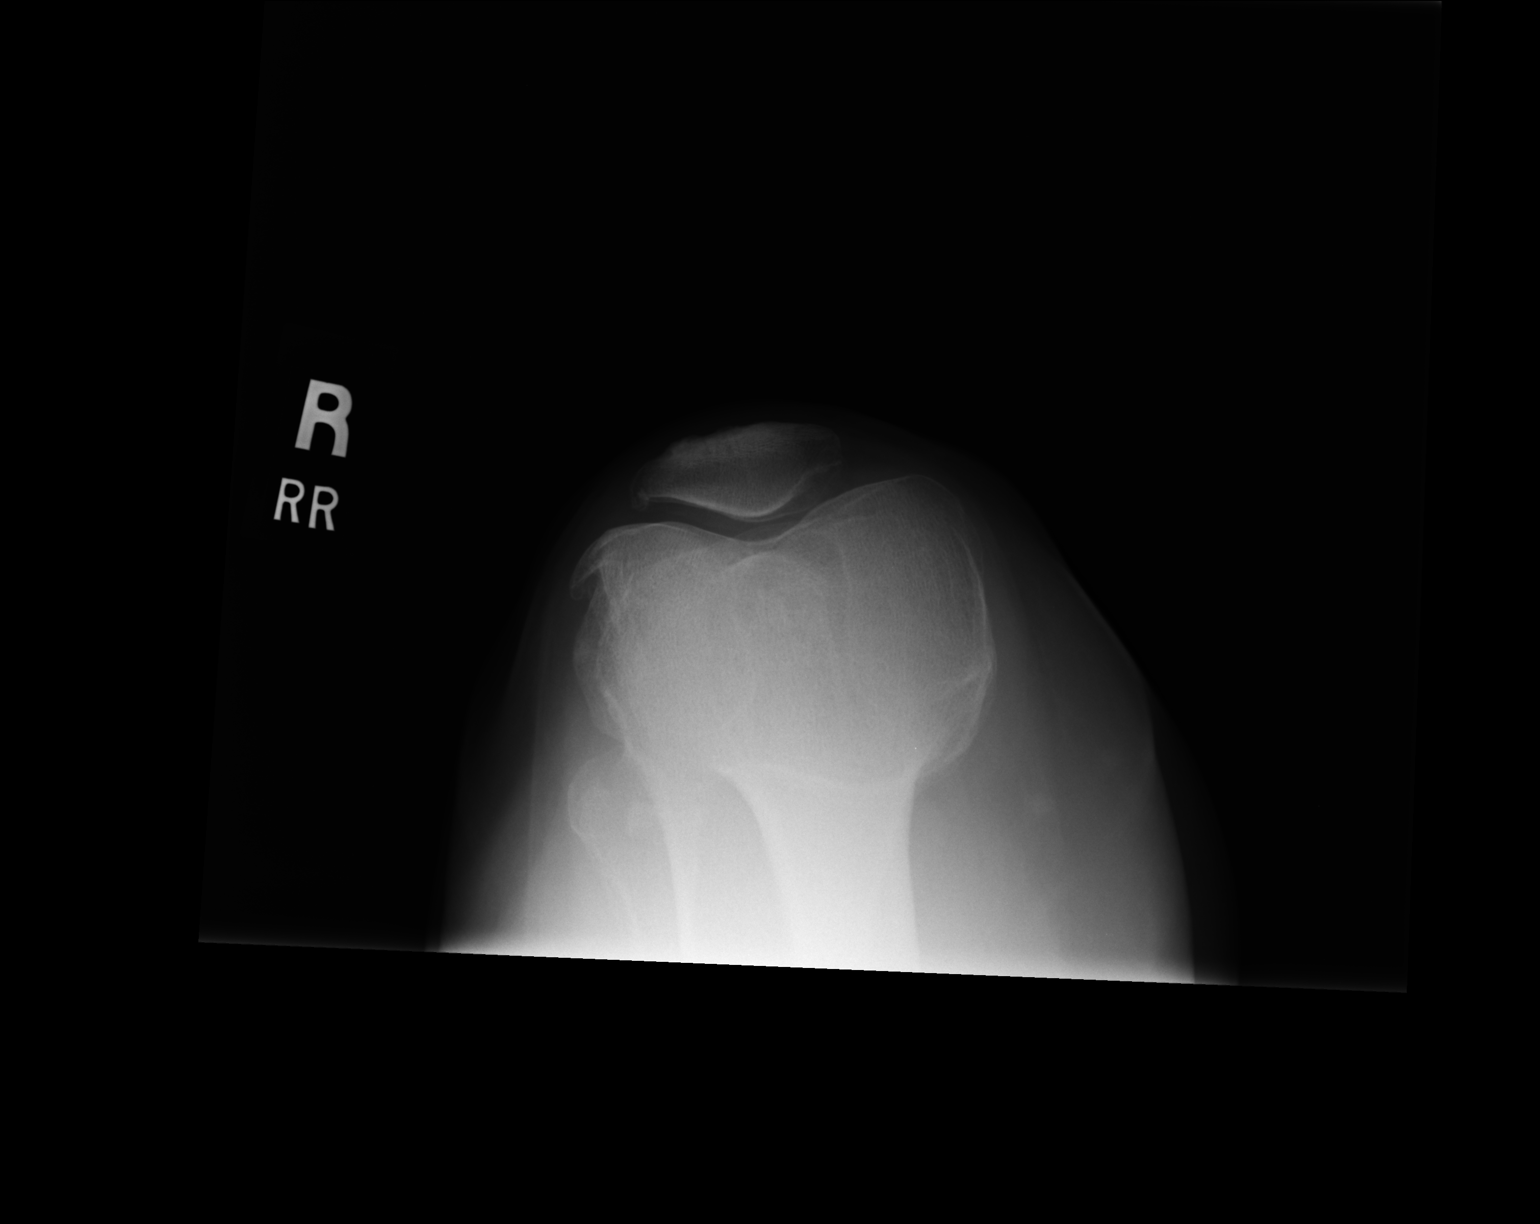

[lateral]
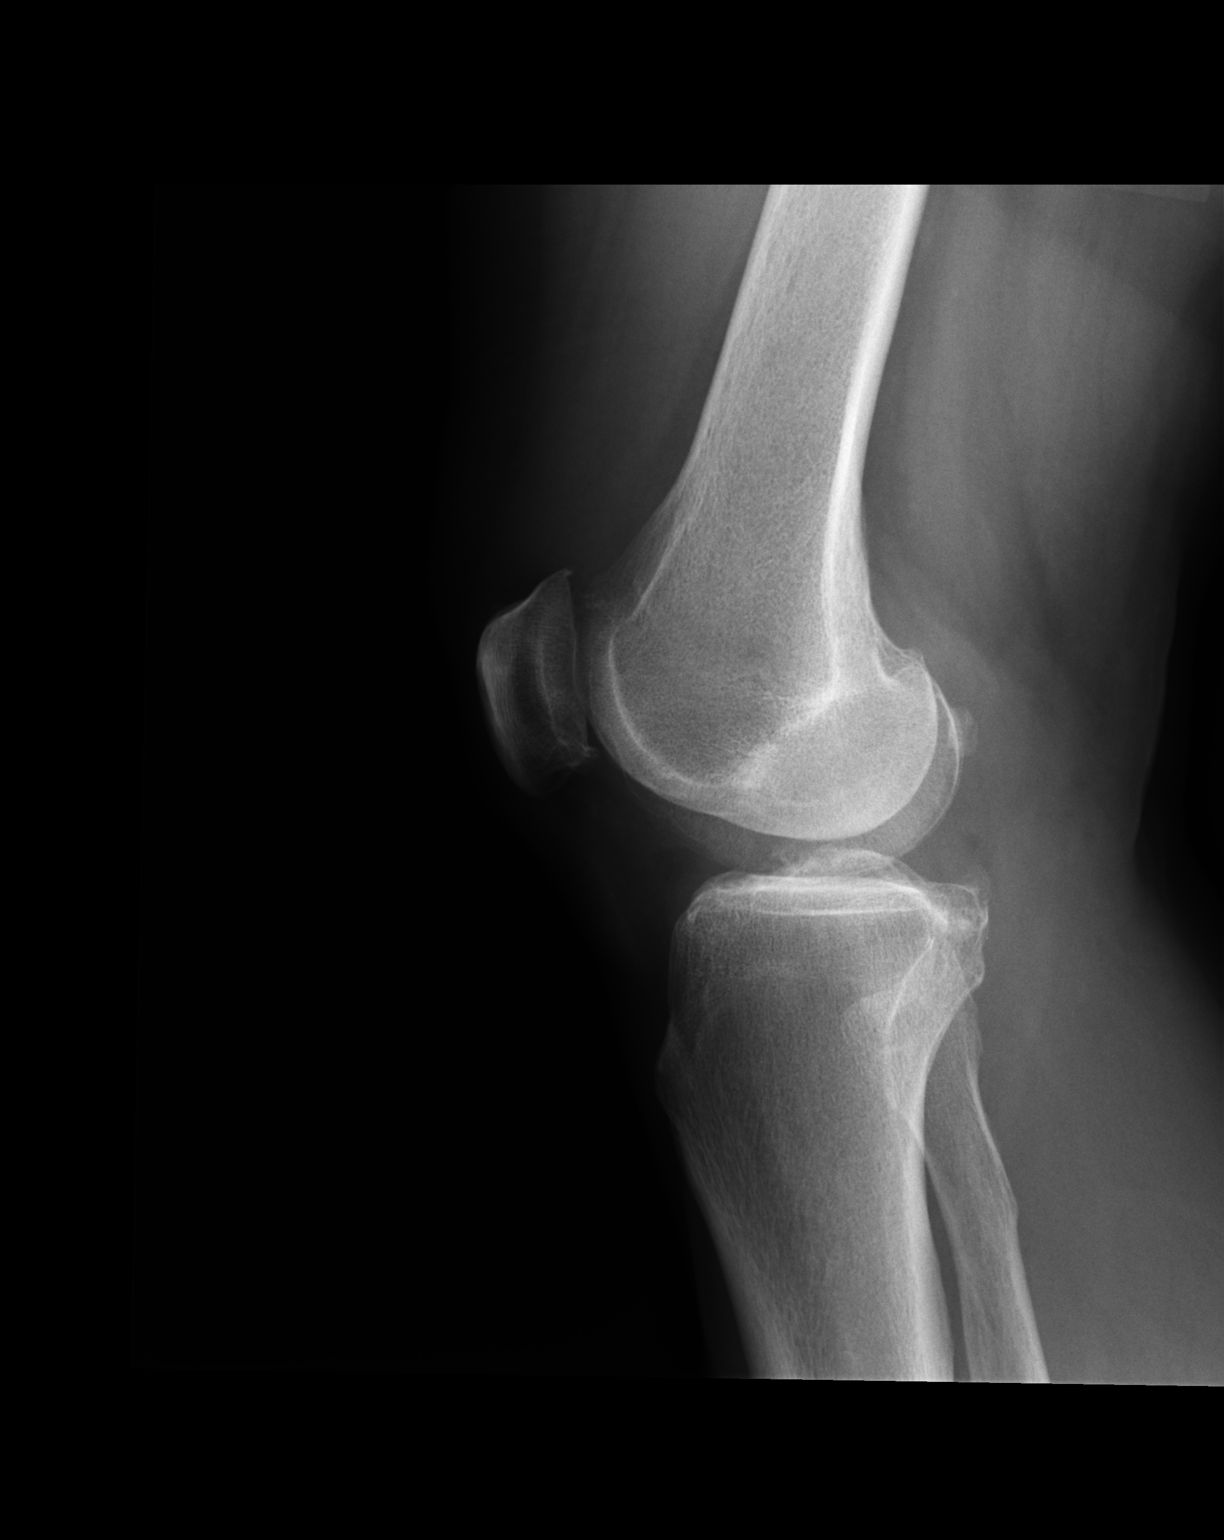

[3 of 3 positions shown; findings below may reference images not displayed]

FINDINGS: Progressive joint space narrowing and spurring, most pronounced in
the lateral and patellofemoral compartments. No joint effusion. No
acute bony abnormality. Specifically, no fracture, subluxation, or
dislocation. Soft tissues are intact.
IMPRESSION: Mild to moderate osteoarthritis within the lateral and
patellofemoral compartments, progressed since [DATE].

## 2015-08-12 IMAGING — CR DG CHEST 2V
2 series · 2 of 2 positions shown · non-contrast
Comparison: None.

CLINICAL DATA: Chest pain

EXAM:
CHEST  2 VIEW

[PA]
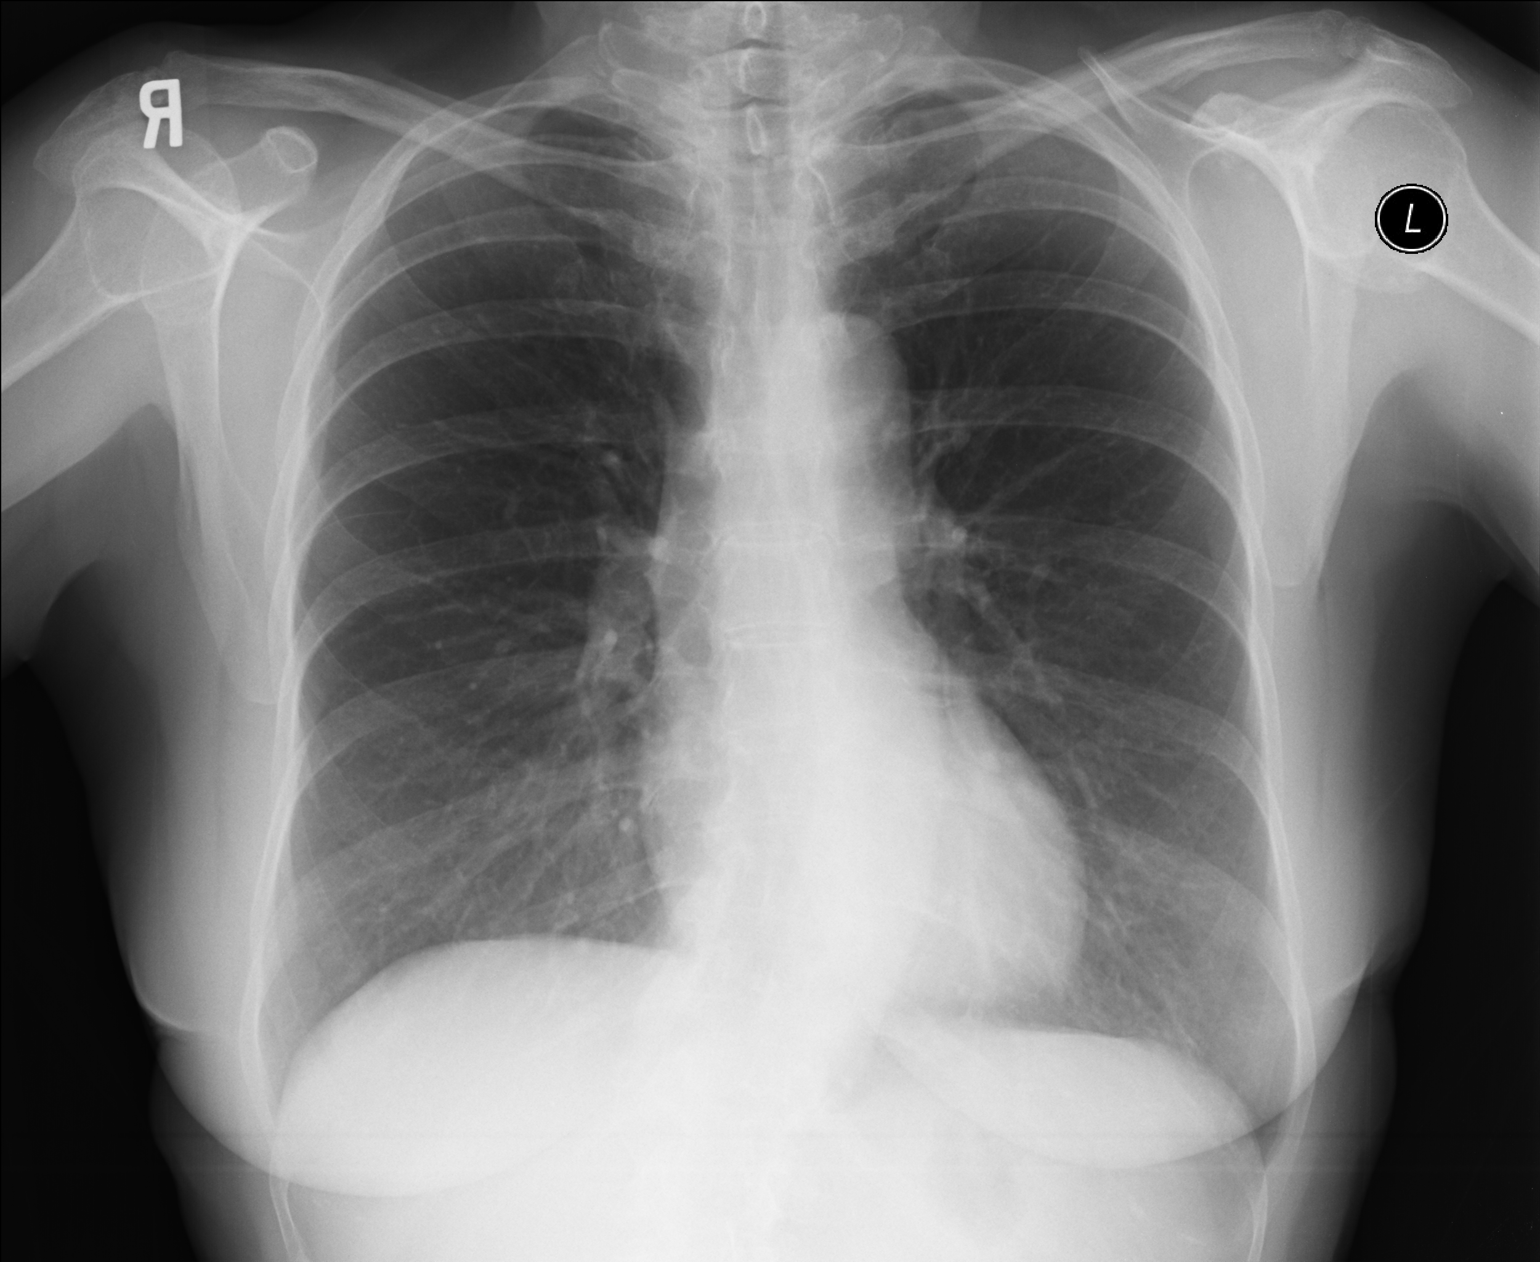

[lateral]
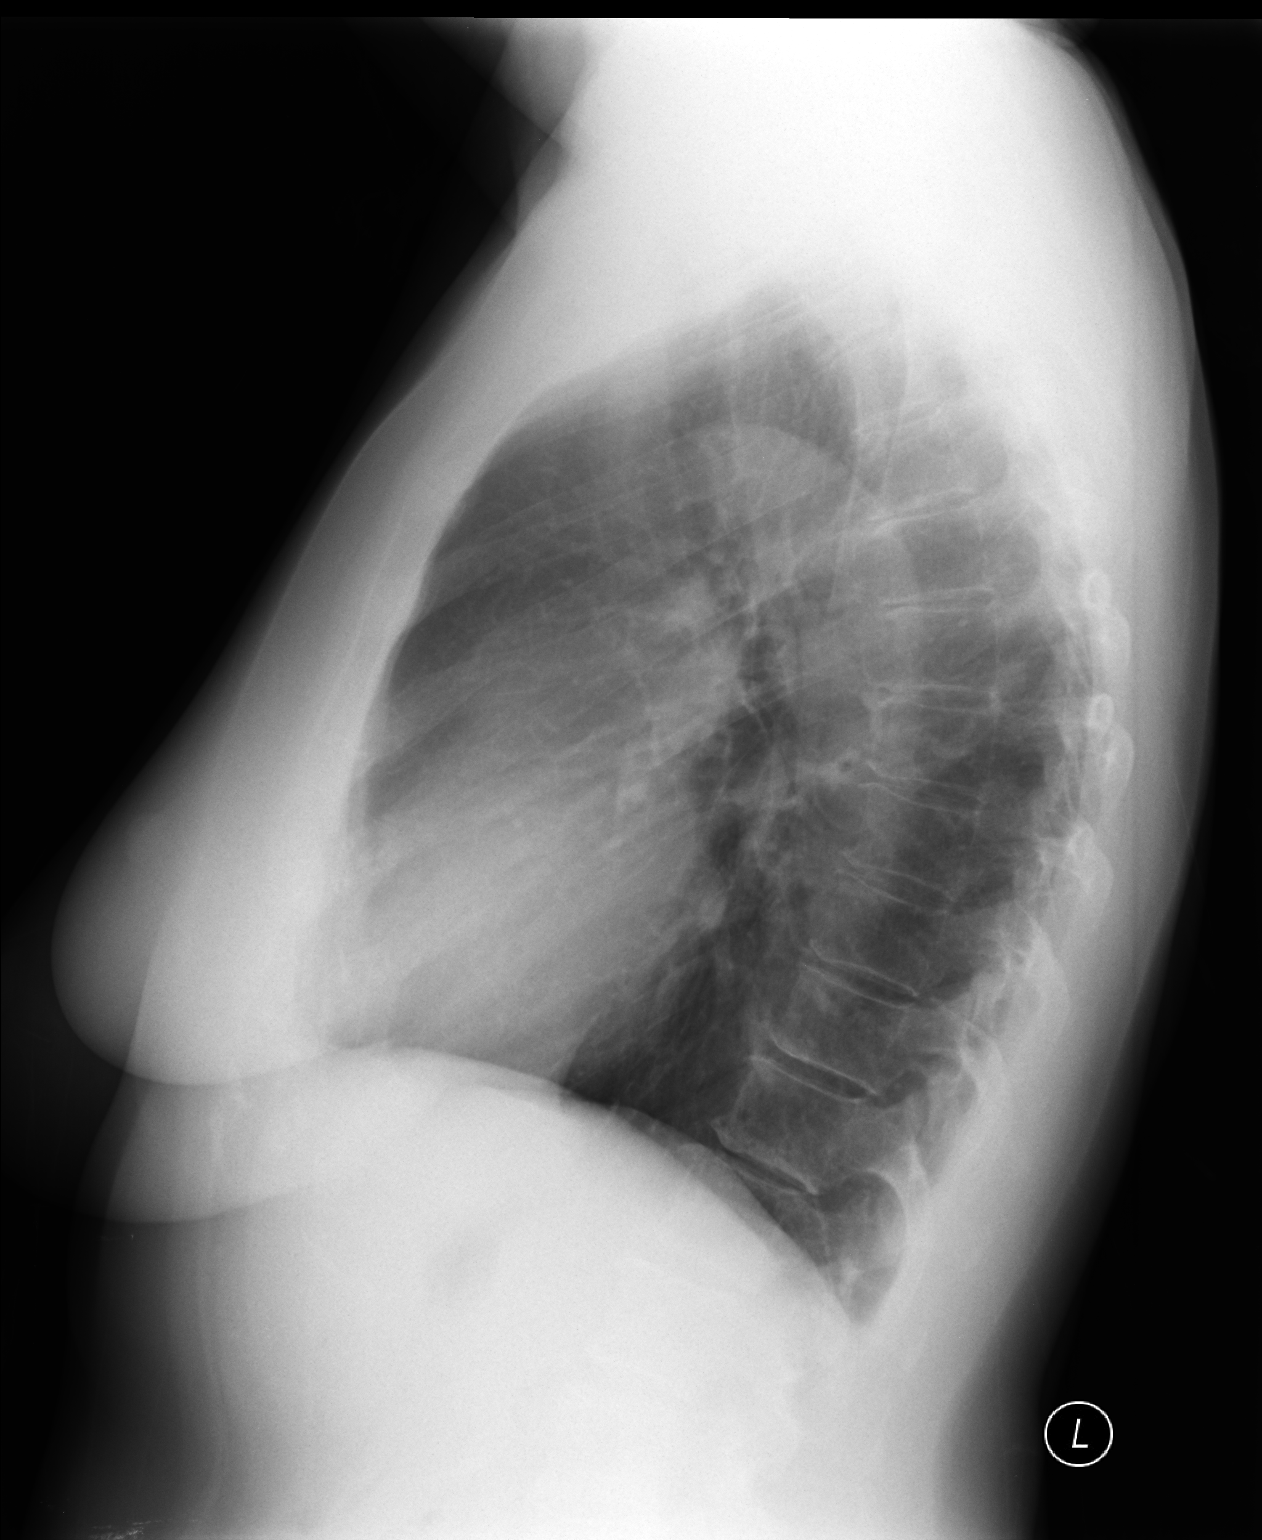

[2 of 2 positions shown; findings below may reference images not displayed]

FINDINGS: The lungs are clear. The heart size and pulmonary vascularity are
normal. No adenopathy. There is thoracolumbar dextroscoliosis. No
focal bone lesions.
IMPRESSION: No edema or consolidation.

## 2015-08-13 ENCOUNTER — Ambulatory Visit (INDEPENDENT_AMBULATORY_CARE_PROVIDER_SITE_OTHER): Payer: BLUE CROSS/BLUE SHIELD | Admitting: Family Medicine

## 2015-08-13 ENCOUNTER — Other Ambulatory Visit: Payer: Self-pay | Admitting: Emergency Medicine

## 2015-08-13 VITALS — BP 182/90 | HR 98 | Temp 98.5°F | Resp 18 | Ht 61.75 in | Wt 138.0 lb

## 2015-08-13 DIAGNOSIS — I10 Essential (primary) hypertension: Secondary | ICD-10-CM

## 2015-08-13 DIAGNOSIS — E876 Hypokalemia: Secondary | ICD-10-CM | POA: Diagnosis not present

## 2015-08-13 LAB — COMPREHENSIVE METABOLIC PANEL
ALK PHOS: 57 U/L (ref 33–130)
ALT: 15 U/L (ref 6–29)
AST: 21 U/L (ref 10–35)
Albumin: 4.3 g/dL (ref 3.6–5.1)
BUN: 13 mg/dL (ref 7–25)
CO2: 24 mmol/L (ref 20–31)
CREATININE: 0.63 mg/dL (ref 0.50–0.99)
Calcium: 9.2 mg/dL (ref 8.6–10.4)
Chloride: 100 mmol/L (ref 98–110)
GLUCOSE: 85 mg/dL (ref 65–99)
POTASSIUM: 4.3 mmol/L (ref 3.5–5.3)
SODIUM: 136 mmol/L (ref 135–146)
TOTAL PROTEIN: 6.8 g/dL (ref 6.1–8.1)
Total Bilirubin: 0.3 mg/dL (ref 0.2–1.2)

## 2015-08-13 MED ORDER — TRIAMTERENE-HCTZ 37.5-25 MG PO TABS: 1.0000 | ORAL_TABLET | Freq: Every day | ORAL | Status: DC

## 2015-08-13 NOTE — Patient Instructions (Signed)
We should have your labs back in the next day or so. Please plan on returning for blood pressure check in about 3 weeks.

## 2015-08-13 NOTE — Progress Notes (Signed)
By signing my name below, I, Amanda Curry, attest that this documentation has been prepared under the direction and in the presence of Amanda Curry, [place], Medical Scribe. 08/13/2015.  1:43 PM.  Patient ID: Amanda Curry MRN: [id number], DOB: [birth year], 64 y.o. Date of Encounter: 08/13/2015  Primary Physician: Amanda Haber, MD  Chief Complaint:  Chief Complaint  Patient presents with  . blood work  . Hypertension    HPI:  Amanda Curry is a 64 y.o. female who presents to [facility] complaining of hypertension.  Pt repots that she is compliant with taking amlodipine 2.3 mg, and lisinopril 30 mg. She notes that at every office visit, her blood pressure readings are always elevated. Pt does not check her blood pressure at home regularly. She indicates that she was taken off HCT 3 weeks ago hypokalemia, she denies noticing symptoms secondary to this. She does report that being in this medication previously did lower her blood pressure.    She notes that she was advised by Dr. Ouida Curry to get a Kidney US. She has not had this done yet.    Pt is not a smoker.   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    Past Medical History  Diagnosis Date  . Hypertension   . Hyperlipidemia   . Arthritis   . Ganglion of left wrist     thumb joint  . Diverticulosis   . Tinnitus      Home Meds: Prior to Admission medications   Medication Sig Start Date End Date Taking? Authorizing Provider  amLODipine (NORVASC) 2.5 MG tablet Take 1 tablet (2.5 mg total) by mouth daily. 07/22/15  Yes Amanda Culver, MD  fish oil-omega-3 fatty acids 1000 MG capsule Take 2 g by mouth daily.   Yes Historical Provider, MD  glucosamine-chondroitin 500-400 MG tablet Take 1 tablet by mouth 3 (three) times daily.   Yes Historical Provider, MD  lisinopril (PRINIVIL,ZESTRIL) 30 MG tablet Take 1 tablet (30 mg total) by mouth daily. 06/09/15  Yes Amanda Culver, MD    Melatonin 3 MG TABS Take 1 tablet by mouth at bedtime as needed (sleep).   Yes Historical Provider, MD  Multiple Vitamin (MULTIVITAMIN WITH MINERALS) TABS Take 1 tablet by mouth daily.   Yes Historical Provider, MD  Pitavastatin Calcium (LIVALO) 2 MG TABS TAKE 1 TABLET ONCE DAILY 05/31/15  Yes Amanda Jeffery, PA-C  hydrochlorothiazide (HYDRODIURIL) 25 MG tablet Take 1 tablet (25 mg total) by mouth daily. Patient not taking: Reported on 08/13/2015 06/09/15   Amanda Culver, MD    Allergies: No Known Allergies  Social History   Social History  . Marital Status: Married    Spouse Name: Amanda Curry  . Number of Children: 4  . 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 Smoking status: Never Smoker   . Smokeless tobacco: Never Used  . Alcohol Use: Yes     Comment: rarely  . Drug Use: No  . Sexual Activity: Yes   Other Topics Concern  . Not on file   Social History Narrative   Lives with her husband.   Adult children. Two live in [place].     Review of Systems: Constitutional: negative for chills, fever, night sweats, weight changes, or fatigue  HEENT: negative for vision changes, hearing loss, congestion, rhinorrhea, ST, epistaxis, or sinus pressure  Cardiovascular: negative for chest pain or palpitations Respiratory: negative for hemoptysis, wheezing, shortness of breath, or cough Abdominal: negative for abdominal pain, nausea, vomiting, diarrhea, or constipation Dermatological: negative for rash Neurologic: negative for headache, dizziness, or syncope All other systems reviewed and are otherwise negative with the exception to those above and in the HPI.  Physical Exam: Blood pressure 182/90, pulse 98, temperature 98.5 F (36.9 C), temperature source Oral, resp. rate 18, height 5' 1.75" (1.568 m), weight 138 lb (62.596 kg), SpO2 98 %., Body mass index is 25.46 kg/(m^2). General: Well  developed, well nourished, in no acute distress. Head: Normocephalic, atraumatic, eyes without discharge, sclera non-icteric, nares are without discharge. Bilateral auditory canals clear, TM's are without perforation, pearly grey and translucent with reflective cone of light bilaterally. Oral cavity moist, posterior pharynx without exudate, erythema, peritonsillar abscess, or post nasal drip.  Neck: Supple. No thyromegaly. Full ROM. No lymphadenopathy. Lungs: Clear bilaterally to auscultation without wheezes, rales, or rhonchi. Breathing is unlabored. Heart: RRR with S1 S2. No murmurs, rubs, or gallops appreciated. Abdomen: Soft, non-tender, non-distended with normoactive bowel sounds. No hepatomegaly. No rebound/guarding. No obvious abdominal masses. Msk:  Strength and tone normal for age. Extremities/Skin: Warm and dry. No clubbing or cyanosis. No edema. No rashes or suspicious lesions. Neuro: Alert and oriented X 3. Moves all extremities spontaneously. Gait is normal. CNII-XII grossly in tact. Psych:  Responds to questions appropriately with a normal affect.  Blood pressure: left arm/resting- 180/94  Labs:  ASSESSMENT AND PLAN:  64 y.o. year old female with   This chart was scribed in my presence and reviewed by me personally.    ICD-9-CM ICD-10-CM   1. Essential hypertension 401.9 I10 triamterene-hydrochlorothiazide (MAXZIDE-25) 37.5-25 MG tablet     Aldosterone + renin activity w/ ratio  2. Hypokalemia 276.8 E87.6 Comprehensive metabolic panel     Aldosterone + renin activity w/ ratio    Signed, Amanda Haber, MD 08/13/2015 1:32 PM

## 2015-08-19 LAB — ALDOSTERONE + RENIN ACTIVITY W/ RATIO
ALDO / PRA Ratio: 2.6 Ratio (ref 0.9–28.9)
Aldosterone: 3 ng/dL
PRA LC/MS/MS: 1.14 ng/mL/h (ref 0.25–5.82)

## 2015-09-28 DIAGNOSIS — M25561 Pain in right knee: Secondary | ICD-10-CM | POA: Diagnosis not present

## 2015-09-28 DIAGNOSIS — M1711 Unilateral primary osteoarthritis, right knee: Secondary | ICD-10-CM | POA: Diagnosis not present

## 2015-10-06 DIAGNOSIS — M25561 Pain in right knee: Secondary | ICD-10-CM | POA: Diagnosis not present

## 2015-10-06 DIAGNOSIS — M1711 Unilateral primary osteoarthritis, right knee: Secondary | ICD-10-CM | POA: Diagnosis not present

## 2015-10-07 DIAGNOSIS — N8182 Incompetence or weakening of pubocervical tissue: Secondary | ICD-10-CM | POA: Diagnosis not present

## 2015-10-20 ENCOUNTER — Other Ambulatory Visit: Payer: Self-pay | Admitting: Family Medicine

## 2015-10-22 ENCOUNTER — Ambulatory Visit (INDEPENDENT_AMBULATORY_CARE_PROVIDER_SITE_OTHER): Payer: PPO | Admitting: Family Medicine

## 2015-10-22 VITALS — BP 132/88 | HR 76 | Temp 99.1°F | Resp 18 | Ht 61.75 in | Wt 137.8 lb

## 2015-10-22 DIAGNOSIS — Z5181 Encounter for therapeutic drug level monitoring: Secondary | ICD-10-CM

## 2015-10-22 DIAGNOSIS — E785 Hyperlipidemia, unspecified: Secondary | ICD-10-CM

## 2015-10-22 DIAGNOSIS — I10 Essential (primary) hypertension: Secondary | ICD-10-CM

## 2015-10-22 MED ORDER — PITAVASTATIN CALCIUM 2 MG PO TABS: ORAL_TABLET | ORAL | Status: DC

## 2015-10-22 MED ORDER — AMLODIPINE BESYLATE 2.5 MG PO TABS: 2.5000 mg | ORAL_TABLET | Freq: Every day | ORAL | Status: DC

## 2015-10-22 MED ORDER — LISINOPRIL 30 MG PO TABS: 30.0000 mg | ORAL_TABLET | Freq: Every day | ORAL | Status: DC

## 2015-10-22 NOTE — Progress Notes (Signed)
Subjective:    Patient ID: Amanda Curry, female    DOB: Jun 05, 1951, 65 y.o.   MRN: [id number] By signing my name below, I, Amanda Curry, attest that this documentation has been prepared under the direction and in the presence of Amanda Cheadle, MD.  Electronically Signed: Zola Curry, Medical Scribe. 10/22/2015. 11:47 AM.  Chief Complaint  Patient presents with  . Medication Refill    amlodipine 2.5mg  and lisinopril 30mg     HPI HPI Comments: Amanda Curry is a 65 y.o. female with a history of hypertension who presents to the [facility] for a medication refill for amlodipine and lisinopril. Patient states she ran out of the amlodipine a few days ago. She does not check her blood pressure at home. She has a cough which she believes is a side effect of lisinopril, but the cough does not bother her. Patient takes Livalo 1 mg qd. She notes that most statins make her feet burn/itch which affects her sleep. She had been on Maxzide, but she stopped taking it after 1 week due to side effects. Patient denies chest pain and SOB.  Past Medical History  Diagnosis Date  . Hypertension   . Hyperlipidemia   . Arthritis   . Ganglion of left wrist     thumb joint  . Diverticulosis   . Tinnitus    Past Surgical History  Procedure Laterality Date  . Tubal ligation    . Tonsillectomy     Current Outpatient Prescriptions on File Prior to Visit  Medication Sig Dispense Refill  . fish oil-omega-3 fatty acids 1000 MG capsule Take 2 g by mouth daily.    Marland Kitchen glucosamine-chondroitin 500-400 MG tablet Take 1 tablet by mouth 3 (three) times daily.    . Melatonin 3 MG TABS Take 1 tablet by mouth at bedtime as needed (sleep).    . Multiple Vitamin (MULTIVITAMIN WITH MINERALS) TABS Take 1 tablet by mouth daily.     No current facility-administered medications on file prior to visit.   Allergies  Allergen Reactions  . Hctz [Hydrochlorothiazide] Other (See Comments)    Causes extreme  hyponatremia quickly - 126   Family History  Problem Relation Age of Onset  . Colon cancer Maternal Grandmother   . Prostate cancer Father   . Alzheimer's disease Mother   . Hypertension Sister    Social History   Social History  . Marital Status: Married    Spouse Name: Amanda Curry  . Number of Children: 4  . 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 Smoking status: Never Smoker   . Smokeless tobacco: Never Used  . Alcohol Use: Yes     Comment: rarely  . Drug Use: No  . Sexual Activity: Yes   Other Topics Concern  . None   Social History Narrative   Lives with her husband.   Adult children. Two live in [place].     Review of Systems  Constitutional: Negative for fever, chills, diaphoresis, activity change and appetite change.  Eyes: Negative for visual disturbance.  Respiratory: Positive for cough. Negative for shortness of breath.   Cardiovascular: Negative for chest pain, palpitations and leg swelling.  Genitourinary: Negative for decreased urine volume.  Neurological: Negative for syncope and headaches.  Hematological: Does not bruise/bleed easily.  Psychiatric/Behavioral: Negative for sleep disturbance.       Objective:  BP 132/88  mmHg  Pulse 76  Temp(Src) 99.1 F (37.3 C) (Oral)  Resp 18  Ht 5' 1.75" (1.568 m)  Wt 137 lb 12.8 oz (62.506 kg)  BMI 25.42 kg/m2  SpO2 98%  Physical Exam  Constitutional: She is oriented to person, place, and time. She appears well-developed and well-nourished. No distress.  HENT:  Head: Normocephalic and atraumatic.  Mouth/Throat: Oropharynx is clear and moist. No oropharyngeal exudate.  Eyes: Pupils are equal, round, and reactive to light.  Neck: Neck supple. No thyromegaly present.  Normal thyroid.  Cardiovascular: Normal rate, regular rhythm, S1 normal, S2 normal and normal heart sounds.   No murmur heard. Pulmonary/Chest: Effort  normal and breath sounds normal. No respiratory distress. She has no wheezes.  Clear to auscultation bilaterally.   Musculoskeletal: She exhibits no edema.  Neurological: She is alert and oriented to person, place, and time. No cranial nerve deficit.  Skin: Skin is warm and dry. No rash noted.  Psychiatric: She has a normal mood and affect. Her behavior is normal.  Nursing note and vitals reviewed.         Assessment & Plan:   1. Essential hypertension   2. Hyperlipidemia   3. Medication monitoring encounter   refilled medications, all chronic diseases seem very well controlled, no med side effects. Recommend fasting lipid panel at visit next yr.  Orders Placed This Encounter  Procedures  . Basic metabolic panel    Order Specific Question:  Has the patient fasted?    Answer:  No    Meds ordered this encounter  Medications  . lisinopril (PRINIVIL,ZESTRIL) 30 MG tablet    Sig: Take 1 tablet (30 mg total) by mouth daily.    Dispense:  90 tablet    Refill:  3  . amLODipine (NORVASC) 2.5 MG tablet    Sig: Take 1 tablet (2.5 mg total) by mouth daily.    Dispense:  90 tablet    Refill:  3  . Pitavastatin Calcium (LIVALO) 2 MG TABS    Sig: TAKE 1 TABLET ONCE DAILY    Dispense:  90 tablet    Refill:  1    I personally performed the services described in this documentation, which was scribed in my presence. The recorded information has been reviewed and considered, and addended by me as needed.  Amanda Cheadle, MD MPH

## 2015-10-23 ENCOUNTER — Encounter: Payer: Self-pay | Admitting: Family Medicine

## 2015-10-23 LAB — BASIC METABOLIC PANEL
BUN: 15 mg/dL (ref 7–25)
CALCIUM: 9.5 mg/dL (ref 8.6–10.4)
CO2: 25 mmol/L (ref 20–31)
CREATININE: 0.58 mg/dL (ref 0.50–0.99)
Chloride: 98 mmol/L (ref 98–110)
Glucose, Bld: 94 mg/dL (ref 65–99)
Potassium: 4.2 mmol/L (ref 3.5–5.3)
Sodium: 134 mmol/L — ABNORMAL LOW (ref 135–146)

## 2015-11-04 DIAGNOSIS — M25561 Pain in right knee: Secondary | ICD-10-CM | POA: Diagnosis not present

## 2015-11-04 DIAGNOSIS — M1711 Unilateral primary osteoarthritis, right knee: Secondary | ICD-10-CM | POA: Diagnosis not present

## 2016-02-03 DIAGNOSIS — N8182 Incompetence or weakening of pubocervical tissue: Secondary | ICD-10-CM | POA: Diagnosis not present

## 2016-05-04 DIAGNOSIS — N8182 Incompetence or weakening of pubocervical tissue: Secondary | ICD-10-CM | POA: Diagnosis not present

## 2016-05-24 ENCOUNTER — Encounter: Payer: Self-pay | Admitting: Family Medicine

## 2016-05-24 ENCOUNTER — Ambulatory Visit (INDEPENDENT_AMBULATORY_CARE_PROVIDER_SITE_OTHER): Payer: PPO | Admitting: Family Medicine

## 2016-05-24 VITALS — BP 132/70 | HR 98 | Temp 98.3°F | Resp 16 | Ht 61.5 in | Wt 136.6 lb

## 2016-05-24 DIAGNOSIS — M545 Low back pain, unspecified: Secondary | ICD-10-CM

## 2016-05-24 MED ORDER — TIZANIDINE HCL 2 MG PO TABS: ORAL_TABLET | ORAL | 1 refills | Status: DC

## 2016-05-24 MED ORDER — MELOXICAM 15 MG PO TABS: 15.0000 mg | ORAL_TABLET | Freq: Every day | ORAL | 3 refills | Status: DC

## 2016-05-24 NOTE — Patient Instructions (Addendum)
IF you received an x-ray today, you will receive an invoice from [facility]. Please contact [facility] at [phone] with questions or concerns regarding your invoice.   IF you received labwork today, you will receive an invoice from [employer]. Please contact [facility] at [phone] with questions or concerns regarding your invoice.   Our billing staff will not be able to assist you with questions regarding bills from these companies.  You will be contacted with the lab results as soon as they are available. The fastest way to get your results is to activate your My Chart account. Instructions are located on the last page of this paperwork. If you have not heard from [place] regarding the results in 2 weeks, please contact this office.     Sacroiliac Joint Dysfunction Sacroiliac joint dysfunction is a condition that causes inflammation on one or both sides of the sacroiliac (SI) joint. The SI joint connects the lower part of the spine (sacrum) with the two upper portions of the pelvis (ilium). This condition causes deep aching or burning pain in the low back. In some cases, the pain may also spread into one or both buttocks or hips or spread down the legs. CAUSES This condition may be caused by:  Pregnancy. During pregnancy, extra stress is put on the SI joints because the pelvis widens.  Injury, such as:  Car accidents.  Sport-related injuries.  Work-related injuries.  Having one leg that is shorter than the other.  Conditions that affect the joints, such as:  Rheumatoid arthritis.  Gout.  Psoriatic arthritis.  Joint infection (septic arthritis). Sometimes, the cause of SI joint dysfunction is not known. SYMPTOMS Symptoms of this condition include:  Aching or burning pain in the lower back. The pain may also spread to other areas, such as:  Buttocks.  Groin.  Thighs and legs.  Muscle spasms in or around the painful  areas.  Increased pain when standing, walking, running, stair climbing, bending, or lifting. DIAGNOSIS Your health care provider will do a physical exam and take your medical history. During the exam, the health care provider may move one or both of your legs to different positions to check for pain. Various tests may be done to help verify the diagnosis, including:  Imaging tests to look for other causes of pain. These may include:  MRI.  CT scan.  Bone scan.  Diagnostic injection. A numbing medicine is injected into the SI joint using a needle. If the pain is temporarily improved or stopped after the injection, this can indicate that SI joint dysfunction is the problem. TREATMENT Treatment may vary depending on the cause and severity of your condition. Treatment options may include:  Applying ice or heat to the lower back area. This can help to reduce pain and muscle spasms.  Medicines to relieve pain or inflammation or to relax the muscles.  Wearing a back brace (sacroiliac brace) to help support the joint while your back is healing.  Physical therapy to increase muscle strength around the joint and flexibility at the joint. This may also involve learning proper body positions and ways of moving to relieve stress on the joint.  Direct manipulation of the SI joint.  Injections of steroid medicine into the joint in order to reduce pain and swelling.  Radiofrequency ablation to burn away nerves that are carrying pain messages from the joint.  Use of a device that provides electrical stimulation in order to reduce pain at the  joint.  Surgery to put in screws and plates that limit or prevent joint motion. This is rare. HOME CARE INSTRUCTIONS  Rest as needed. Limit your activities as directed by your health care provider.  Take medicines only as directed by your health care provider.  If directed, apply ice to the affected area:  Put ice in a plastic bag.  Place a towel  between your skin and the bag.  Leave the ice on for 20 minutes, 2-3 times per day.  Use a heating pad or a moist heat pack as directed by your health care provider.  Exercise as directed by your health care provider or physical therapist.  Keep all follow-up visits as directed by your health care provider. This is important. SEEK MEDICAL CARE IF:  Your pain is not controlled with medicine.  You have a fever.  You have increasingly severe pain. SEEK IMMEDIATE MEDICAL CARE IF:  You have weakness, numbness, or tingling in your legs or feet.  You lose control of your bladder or bowel.   This information is not intended to replace advice given to you by your health care provider. Make sure you discuss any questions you have with your health care provider.   Document Released: 11/11/2008 Document Revised: 12/30/2014 Document Reviewed: 04/22/2014 Elsevier Interactive Patient Education 2016 [place] Strain With Rehab A strain is an injury in which a tendon or muscle is torn. The muscles and tendons of the lower back are vulnerable to strains. However, these muscles and tendons are very strong and require a great force to be injured. Strains are classified into three categories. Grade 1 strains cause pain, but the tendon is not lengthened. Grade 2 strains include a lengthened ligament, due to the ligament being stretched or partially ruptured. With grade 2 strains there is still function, although the function may be decreased. Grade 3 strains involve a complete tear of the tendon or muscle, and function is usually impaired. SYMPTOMS   Pain in the lower back.  Pain that affects one side more than the other.  Pain that gets worse with movement and may be felt in the hip, buttocks, or back of the thigh.  Muscle spasms of the muscles in the back.  Swelling along the muscles of the back.  Loss of strength of the back muscles.  Crackling sound (crepitation) when the  muscles are touched. CAUSES  Lower back strains occur when a force is placed on the muscles or tendons that is greater than they can handle. Common causes of injury include:  Prolonged overuse of the muscle-tendon units in the lower back, usually from incorrect posture.  A single violent injury or force applied to the back. RISK INCREASES WITH:  Sports that involve twisting forces on the spine or a lot of bending at the waist (football, rugby, weightlifting, bowling, golf, tennis, speed skating, racquetball, swimming, running, gymnastics, diving).  Poor strength and flexibility.  Failure to warm up properly before activity.  Family history of lower back pain or disk disorders.  Previous back injury or surgery (especially fusion).  Poor posture with lifting, especially heavy objects.  Prolonged sitting, especially with poor posture. PREVENTION   Learn and use proper posture when sitting or lifting (maintain proper posture when sitting, lift using the knees and legs, not at the waist).  Warm up and stretch properly before activity.  Allow for adequate recovery between workouts.  Maintain physical fitness:  Strength, flexibility, and endurance.  Cardiovascular fitness. PROGNOSIS  If treated properly, lower back strains usually heal within 6 weeks. RELATED COMPLICATIONS   Recurring symptoms, resulting in a chronic problem.  Chronic inflammation, scarring, and partial muscle-tendon tear.  Delayed healing or resolution of symptoms.  Prolonged disability. TREATMENT  Treatment first involves the use of ice and medicine, to reduce pain and inflammation. The use of strengthening and stretching exercises may help reduce pain with activity. These exercises may be performed at home or with a therapist. Severe injuries may require referral to a therapist for further evaluation and treatment, such as ultrasound. Your caregiver may advise that you wear a back brace or corset, to help  reduce pain and discomfort. Often, prolonged bed rest results in greater harm then benefit. Corticosteroid injections may be recommended. However, these should be reserved for the most serious cases. It is important to avoid using your back when lifting objects. At night, sleep on your back on a firm mattress with a pillow placed under your knees. If non-surgical treatment is unsuccessful, surgery may be needed.  MEDICATION   If pain medicine is needed, nonsteroidal anti-inflammatory medicines (aspirin and ibuprofen), or other minor pain relievers (acetaminophen), are often advised.  Do not take pain medicine for 7 days before surgery.  Prescription pain relievers may be given, if your caregiver thinks they are needed. Use only as directed and only as much as you need.  Ointments applied to the skin may be helpful.  Corticosteroid injections may be given by your caregiver. These injections should be reserved for the most serious cases, because they may only be given a certain number of times. HEAT AND COLD  Cold treatment (icing) should be applied for 10 to 15 minutes every 2 to 3 hours for inflammation and pain, and immediately after activity that aggravates your symptoms. Use ice packs or an ice massage.  Heat treatment may be used before performing stretching and strengthening activities prescribed by your caregiver, physical therapist, or athletic trainer. Use a heat pack or a warm water soak. SEEK MEDICAL CARE IF:   Symptoms get worse or do not improve in 2 to 4 weeks, despite treatment.  You develop numbness, weakness, or loss of bowel or bladder function.  New, unexplained symptoms develop. (Drugs used in treatment may produce side effects.) EXERCISES  RANGE OF MOTION (ROM) AND STRETCHING EXERCISES - Low Back Strain Most people with lower back pain will find that their symptoms get worse with excessive bending forward (flexion) or arching at the lower back (extension). The exercises  which will help resolve your symptoms will focus on the opposite motion.  Your physician, physical therapist or athletic trainer will help you determine which exercises will be most helpful to resolve your lower back pain. Do not complete any exercises without first consulting with your caregiver. Discontinue any exercises which make your symptoms worse until you speak to your caregiver.  If you have pain, numbness or tingling which travels down into your buttocks, leg or foot, the goal of the therapy is for these symptoms to move closer to your back and eventually resolve. Sometimes, these leg symptoms will get better, but your lower back pain may worsen. This is typically an indication of progress in your rehabilitation. Be very alert to any changes in your symptoms and the activities in which you participated in the 24 hours prior to the change. Sharing this information with your caregiver will allow him/her to most efficiently treat your condition.  These exercises may help you when beginning to rehabilitate  your injury. Your symptoms may resolve with or without further involvement from your physician, physical therapist or athletic trainer. While completing these exercises, remember:  Restoring tissue flexibility helps normal motion to return to the joints. This allows healthier, less painful movement and activity.  An effective stretch should be held for at least 30 seconds.  A stretch should never be painful. You should only feel a gentle lengthening or release in the stretched tissue. FLEXION RANGE OF MOTION AND STRETCHING EXERCISES: STRETCH - Flexion, Single Knee to Chest   Lie on a firm bed or floor with both legs extended in front of you.  Keeping one leg in contact with the floor, bring your opposite knee to your chest. Hold your leg in place by either grabbing behind your thigh or at your knee.  Pull until you feel a gentle stretch in your lower back. Hold __________ seconds.  Slowly  release your grasp and repeat the exercise with the opposite side. Repeat __________ times. Complete this exercise __________ times per day.  STRETCH - Flexion, Double Knee to Chest   Lie on a firm bed or floor with both legs extended in front of you.  Keeping one leg in contact with the floor, bring your opposite knee to your chest.  Tense your stomach muscles to support your back and then lift your other knee to your chest. Hold your legs in place by either grabbing behind your thighs or at your knees.  Pull both knees toward your chest until you feel a gentle stretch in your lower back. Hold __________ seconds.  Tense your stomach muscles and slowly return one leg at a time to the floor. Repeat __________ times. Complete this exercise __________ times per day.  STRETCH - Low Trunk Rotation  Lie on a firm bed or floor. Keeping your legs in front of you, bend your knees so they are both pointed toward the ceiling and your feet are flat on the floor.  Extend your arms out to the side. This will stabilize your upper body by keeping your shoulders in contact with the floor.  Gently and slowly drop both knees together to one side until you feel a gentle stretch in your lower back. Hold for __________ seconds.  Tense your stomach muscles to support your lower back as you bring your knees back to the starting position. Repeat the exercise to the other side. Repeat __________ times. Complete this exercise __________ times per day  EXTENSION RANGE OF MOTION AND FLEXIBILITY EXERCISES: STRETCH - Extension, Prone on Elbows   Lie on your stomach on the floor, a bed will be too soft. Place your palms about shoulder width apart and at the height of your head.  Place your elbows under your shoulders. If this is too painful, stack pillows under your chest.  Allow your body to relax so that your hips drop lower and make contact more completely with the floor.  Hold this position for __________  seconds.  Slowly return to lying flat on the floor. Repeat __________ times. Complete this exercise __________ times per day.  RANGE OF MOTION - Extension, Prone Press Ups  Lie on your stomach on the floor, a bed will be too soft. Place your palms about shoulder width apart and at the height of your head.  Keeping your back as relaxed as possible, slowly straighten your elbows while keeping your hips on the floor. You may adjust the placement of your hands to maximize your comfort. As you gain  motion, your hands will come more underneath your shoulders.  Hold this position __________ seconds.  Slowly return to lying flat on the floor. Repeat __________ times. Complete this exercise __________ times per day.  RANGE OF MOTION- Quadruped, Neutral Spine   Assume a hands and knees position on a firm surface. Keep your hands under your shoulders and your knees under your hips. You may place padding under your knees for comfort.  Drop your head and point your tail bone toward the ground below you. This will round out your lower back like an angry cat. Hold this position for __________ seconds.  Slowly lift your head and release your tail bone so that your back sags into a large arch, like an old horse.  Hold this position for __________ seconds.  Repeat this until you feel limber in your lower back.  Now, find your "sweet spot." This will be the most comfortable position somewhere between the two previous positions. This is your neutral spine. Once you have found this position, tense your stomach muscles to support your lower back.  Hold this position for __________ seconds. Repeat __________ times. Complete this exercise __________ times per day.  STRENGTHENING EXERCISES - Low Back Strain These exercises may help you when beginning to rehabilitate your injury. These exercises should be done near your "sweet spot." This is the neutral, low-back arch, somewhere between fully rounded and fully  arched, that is your least painful position. When performed in this safe range of motion, these exercises can be used for people who have either a flexion or extension based injury. These exercises may resolve your symptoms with or without further involvement from your physician, physical therapist or athletic trainer. While completing these exercises, remember:   Muscles can gain both the endurance and the strength needed for everyday activities through controlled exercises.  Complete these exercises as instructed by your physician, physical therapist or athletic trainer. Increase the resistance and repetitions only as guided.  You may experience muscle soreness or fatigue, but the pain or discomfort you are trying to eliminate should never worsen during these exercises. If this pain does worsen, stop and make certain you are following the directions exactly. If the pain is still present after adjustments, discontinue the exercise until you can discuss the trouble with your caregiver. STRENGTHENING - Deep Abdominals, Pelvic Tilt  Lie on a firm bed or floor. Keeping your legs in front of you, bend your knees so they are both pointed toward the ceiling and your feet are flat on the floor.  Tense your lower abdominal muscles to press your lower back into the floor. This motion will rotate your pelvis so that your tail bone is scooping upwards rather than pointing at your feet or into the floor.  With a gentle tension and even breathing, hold this position for __________ seconds. Repeat __________ times. Complete this exercise __________ times per day.  STRENGTHENING - Abdominals, Crunches   Lie on a firm bed or floor. Keeping your legs in front of you, bend your knees so they are both pointed toward the ceiling and your feet are flat on the floor. Cross your arms over your chest.  Slightly tip your chin down without bending your neck.  Tense your abdominals and slowly lift your trunk high enough  to just clear your shoulder blades. Lifting higher can put excessive stress on the lower back and does not further strengthen your abdominal muscles.  Control your return to the starting position. Repeat __________  times. Complete this exercise __________ times per day.  STRENGTHENING - Quadruped, Opposite UE/LE Lift   Assume a hands and knees position on a firm surface. Keep your hands under your shoulders and your knees under your hips. You may place padding under your knees for comfort.  Find your neutral spine and gently tense your abdominal muscles so that you can maintain this position. Your shoulders and hips should form a rectangle that is parallel with the floor and is not twisted.  Keeping your trunk steady, lift your right hand no higher than your shoulder and then your left leg no higher than your hip. Make sure you are not holding your breath. Hold this position __________ seconds.  Continuing to keep your abdominal muscles tense and your back steady, slowly return to your starting position. Repeat with the opposite arm and leg. Repeat __________ times. Complete this exercise __________ times per day.  STRENGTHENING - Lower Abdominals, Double Knee Lift  Lie on a firm bed or floor. Keeping your legs in front of you, bend your knees so they are both pointed toward the ceiling and your feet are flat on the floor.  Tense your abdominal muscles to brace your lower back and slowly lift both of your knees until they come over your hips. Be certain not to hold your breath.  Hold __________ seconds. Using your abdominal muscles, return to the starting position in a slow and controlled manner. Repeat __________ times. Complete this exercise __________ times per day.  POSTURE AND BODY MECHANICS CONSIDERATIONS - Low Back Strain Keeping correct posture when sitting, standing or completing your activities will reduce the stress put on different body tissues, allowing injured tissues a chance to  heal and limiting painful experiences. The following are general guidelines for improved posture. Your physician or physical therapist will provide you with any instructions specific to your needs. While reading these guidelines, remember:  The exercises prescribed by your provider will help you have the flexibility and strength to maintain correct postures.  The correct posture provides the best environment for your joints to work. All of your joints have less wear and tear when properly supported by a spine with good posture. This means you will experience a healthier, less painful body.  Correct posture must be practiced with all of your activities, especially prolonged sitting and standing. Correct posture is as important when doing repetitive low-stress activities (typing) as it is when doing a single heavy-load activity (lifting). RESTING POSITIONS Consider which positions are most painful for you when choosing a resting position. If you have pain with flexion-based activities (sitting, bending, stooping, squatting), choose a position that allows you to rest in a less flexed posture. You would want to avoid curling into a fetal position on your side. If your pain worsens with extension-based activities (prolonged standing, working overhead), avoid resting in an extended position such as sleeping on your stomach. Most people will find more comfort when they rest with their spine in a more neutral position, neither too rounded nor too arched. Lying on a non-sagging bed on your side with a pillow between your knees, or on your back with a pillow under your knees will often provide some relief. Keep in mind, being in any one position for a prolonged period of time, no matter how correct your posture, can still lead to stiffness. PROPER SITTING POSTURE In order to minimize stress and discomfort on your spine, you must sit with correct posture. Sitting with good posture should be  effortless for a healthy  body. Returning to good posture is a gradual process. Many people can work toward this most comfortably by using various supports until they have the flexibility and strength to maintain this posture on their own. When sitting with proper posture, your ears will fall over your shoulders and your shoulders will fall over your hips. You should use the back of the chair to support your upper back. Your lower back will be in a neutral position, just slightly arched. You may place a small pillow or folded towel at the base of your lower back for support.  When working at a desk, create an environment that supports good, upright posture. Without extra support, muscles tire, which leads to excessive strain on joints and other tissues. Keep these recommendations in mind: CHAIR:  A chair should be able to slide under your desk when your back makes contact with the back of the chair. This allows you to work closely.  The chair's height should allow your eyes to be level with the upper part of your monitor and your hands to be slightly lower than your elbows. BODY POSITION  Your feet should make contact with the floor. If this is not possible, use a foot rest.  Keep your ears over your shoulders. This will reduce stress on your neck and lower back. INCORRECT SITTING POSTURES  If you are feeling tired and unable to assume a healthy sitting posture, do not slouch or slump. This puts excessive strain on your back tissues, causing more damage and pain. Healthier options include:  Using more support, like a lumbar pillow.  Switching tasks to something that requires you to be upright or walking.  Talking a brief walk.  Lying down to rest in a neutral-spine position. PROLONGED STANDING WHILE SLIGHTLY LEANING FORWARD  When completing a task that requires you to lean forward while standing in one place for a long time, place either foot up on a stationary 2-4 inch high object to help maintain the best posture.  When both feet are on the ground, the lower back tends to lose its slight inward curve. If this curve flattens (or becomes too large), then the back and your other joints will experience too much stress, tire more quickly, and can cause pain. CORRECT STANDING POSTURES Proper standing posture should be assumed with all daily activities, even if they only take a few moments, like when brushing your teeth. As in sitting, your ears should fall over your shoulders and your shoulders should fall over your hips. You should keep a slight tension in your abdominal muscles to brace your spine. Your tailbone should point down to the ground, not behind your body, resulting in an over-extended swayback posture.  INCORRECT STANDING POSTURES  Common incorrect standing postures include a forward head, locked knees and/or an excessive swayback. WALKING Walk with an upright posture. Your ears, shoulders and hips should all line-up. PROLONGED ACTIVITY IN A FLEXED POSITION When completing a task that requires you to bend forward at your waist or lean over a low surface, try to find a way to stabilize 3 out of 4 of your limbs. You can place a hand or elbow on your thigh or rest a knee on the surface you are reaching across. This will provide you more stability so that your muscles do not fatigue as quickly. By keeping your knees relaxed, or slightly bent, you will also reduce stress across your lower back. CORRECT LIFTING TECHNIQUES DO :  Assume a wide stance. This will provide you more stability and the opportunity to get as close as possible to the object which you are lifting.  Tense your abdominals to brace your spine. Bend at the knees and hips. Keeping your back locked in a neutral-spine position, lift using your leg muscles. Lift with your legs, keeping your back straight.  Test the weight of unknown objects before attempting to lift them.  Try to keep your elbows locked down at your sides in order get the best  strength from your shoulders when carrying an object.  Always ask for help when lifting heavy or awkward objects. INCORRECT LIFTING TECHNIQUES DO NOT:   Lock your knees when lifting, even if it is a small object.  Bend and twist. Pivot at your feet or move your feet when needing to change directions.  Assume that you can safely pick up even a paper clip without proper posture.   This information is not intended to replace advice given to you by your health care provider. Make sure you discuss any questions you have with your health care provider.   Document Released: 08/15/2005 Document Revised: 09/05/2014 Document Reviewed: 11/27/2008 Elsevier Interactive Patient Education [employer].

## 2016-05-24 NOTE — Progress Notes (Signed)
By signing my name below, I, [name], attest that this documentation has been prepared under the direction and in the presence of Amanda Cheadle, MD.  Electronically Signed: Verlee Curry, Medical Scribe. 05/24/16. 10:57 AM.  Subjective:    Patient ID: Amanda Curry, female    DOB: Aug 30, 1950, 65 y.o.   MRN: [id number]  HPI Chief Complaint  Patient presents with  . Back Pain    lower, and hips    HPI Comments: Amanda Curry is a 65 y.o. female with a PMHx of HTN who presents to the [facility] complaining of lumbar pain and left gluteal pelvic pain onset 1 month ago. Pt states she did some heavy yard work and helped her son move within the past month. Pt initially had radiating pain to her left thigh, but she no longer feels radiating pain. Pt states the pain wakes her up when she lays on her back especially when she lays on her left side vs her right. Pt has been taking ibuprofen BID this past week when she needs it and it gives her relief. Pt mentions having arthritis in her knees and she received a shot in her knees that gave her much relief to her arthritic pain. Pt denies GERD, melena, irregular bm, irregular urinary symptoms, and lower extremity weakness.  HLD: Pt no longer takes fish oil due to her feet burning while she was taking fish oil and Livalo. Pt states when she ran out of fish oil and stopped taking it, her feet stopped burning. Pt isn't sure if her symptoms were from the fish oil or Livalo.  Skin: Pt reports skin dryness. Pt uses collagen elastin for moisturizer.  Exercise: Pt is active.  Lab work: Pt has ate earlier today.  Patient Active Problem List   Diagnosis Date Noted  . HTN (hypertension) 03/21/2013  . Other and unspecified hyperlipidemia 03/21/2013   Past Medical History:  Diagnosis Date  . Arthritis   . Diverticulosis   . Ganglion of left wrist    thumb joint  . Hyperlipidemia   . Hypertension   . Tinnitus    Past Surgical  History:  Procedure Laterality Date  . TONSILLECTOMY    . TUBAL LIGATION     Allergies  Allergen Reactions  . Hctz [Hydrochlorothiazide] Other (See Comments)    Causes extreme hyponatremia quickly - 126   Prior to Admission medications   Medication Sig Start Date End Date Taking? Authorizing Provider  amLODipine (NORVASC) 2.5 MG tablet Take 1 tablet (2.5 mg total) by mouth daily. 10/22/15  Yes Shawnee Knapp, MD  fish oil-omega-3 fatty acids 1000 MG capsule Take 2 g by mouth daily.   Yes Historical Provider, MD  glucosamine-chondroitin 500-400 MG tablet Take 1 tablet by mouth 3 (three) times daily.   Yes Historical Provider, MD  lisinopril (PRINIVIL,ZESTRIL) 30 MG tablet Take 1 tablet (30 mg total) by mouth daily. 10/22/15  Yes Shawnee Knapp, MD  Melatonin 3 MG TABS Take 1 tablet by mouth at bedtime as needed (sleep).   Yes Historical Provider, MD  Multiple Vitamin (MULTIVITAMIN WITH MINERALS) TABS Take 1 tablet by mouth daily.   Yes Historical Provider, MD  Pitavastatin Calcium (LIVALO) 2 MG TABS TAKE 1 TABLET ONCE DAILY 10/22/15  Yes Shawnee Knapp, MD   Social History   Social History  . Marital status: Married    Spouse name: Amanda Curry  . Number of children: 4  . Years of education: N/A  Occupational History  . teacher     [employer]   Social History Main Topics  . Smoking status: Never Smoker  . Smokeless tobacco: Never Used  . Alcohol use Yes     Comment: rarely  . Drug use: No  . Sexual activity: Yes   Other Topics Concern  . Not on file   Social History Narrative   Lives with her husband.   Adult children. Two live in [place].   Depression screen [facility] 2/9 05/24/2016 10/22/2015 08/13/2015 07/22/2015 06/09/2015  Decreased Interest 0 0 0 0 0  Down, Depressed, Hopeless 0 0 0 0 0  PHQ - 2 Score 0 0 0 0 0   Review of Systems  Cardiovascular: Negative for chest pain.  Gastrointestinal: Negative for blood in stool, constipation and diarrhea.  Genitourinary:  Negative for difficulty urinating, dysuria, frequency, hematuria and urgency.  Musculoskeletal: Positive for arthralgias.  Neurological: Negative for weakness.  Psychiatric/Behavioral: Positive for sleep disturbance.   Objective:  Physical Exam  Constitutional: She appears well-developed and well-nourished. No distress.  HENT:  Head: Normocephalic and atraumatic.  Eyes: Conjunctivae are normal.  Neck: Neck supple. No thyromegaly present.  Cardiovascular: Normal rate, regular rhythm, S1 normal, S2 normal and normal heart sounds.  Exam reveals no gallop and no friction rub.   No murmur heard. Pulmonary/Chest: Effort normal and breath sounds normal. No respiratory distress. She has no wheezes. She has no rales.  Musculoskeletal:       Lumbar back: She exhibits no tenderness.  No TTP over the L-spinous process or sacrum A few muscle spasm to the area bilaterally Tenderness along the medial aspect along the left SI joint No tender over the trochanteric bursa Bilateral minimal crepitus in knee Hip flexors 5/5 Hips FROM bilaterally  Lymphadenopathy:    She has no cervical adenopathy.  Neurological: She is alert.  Reflex Scores:      Patellar reflexes are 2+ on the right side and 0 on the left side.      Achilles reflexes are 2+ on the right side and 2+ on the left side. Skin: Skin is warm and dry.  Psychiatric: She has a normal mood and affect. Her behavior is normal.  Nursing note and vitals reviewed.  BP 132/70 (BP Location: Right Arm, Patient Position: Sitting, Cuff Size: Normal)   Pulse 98   Temp 98.3 F (36.8 C) (Oral)   Resp 16   Ht 5' 1.5" (1.562 m)   Wt 136 lb 9.6 oz (62 kg)   SpO2 99%   BMI 25.39 kg/m  Assessment & Plan:   1. Low back pain without sciatica, unspecified back pain laterality, unspecified chronicity      Meds ordered this encounter  Medications  . meloxicam (MOBIC) 15 MG tablet    Sig: Take 1 tablet (15 mg total) by mouth daily.    Dispense:  90  tablet    Refill:  3  . tiZANidine (ZANAFLEX) 2 MG tablet    Sig: 1-2 tabs po qhs for muscle spasms    Dispense:  40 tablet    Refill:  1    I personally performed the services described in this documentation, which was scribed in my presence. The recorded information has been reviewed and considered, and addended by me as needed.   Amanda Curry, M.D.  [facility] [address] [place], [place] [address] [phone] phone [phone] fax  05/30/16 12:42 AM

## 2016-05-25 DIAGNOSIS — N765 Ulceration of vagina: Secondary | ICD-10-CM | POA: Diagnosis not present

## 2016-06-21 DIAGNOSIS — N8182 Incompetence or weakening of pubocervical tissue: Secondary | ICD-10-CM | POA: Diagnosis not present

## 2016-08-03 ENCOUNTER — Ambulatory Visit (INDEPENDENT_AMBULATORY_CARE_PROVIDER_SITE_OTHER): Payer: PPO | Admitting: Family Medicine

## 2016-08-03 VITALS — BP 170/100 | HR 119 | Temp 98.8°F | Resp 16 | Ht 61.5 in | Wt 137.2 lb

## 2016-08-03 DIAGNOSIS — Z124 Encounter for screening for malignant neoplasm of cervix: Secondary | ICD-10-CM | POA: Diagnosis not present

## 2016-08-03 DIAGNOSIS — I10 Essential (primary) hypertension: Secondary | ICD-10-CM | POA: Diagnosis not present

## 2016-08-03 DIAGNOSIS — Z1231 Encounter for screening mammogram for malignant neoplasm of breast: Secondary | ICD-10-CM | POA: Diagnosis not present

## 2016-08-03 DIAGNOSIS — Z6825 Body mass index (BMI) 25.0-25.9, adult: Secondary | ICD-10-CM | POA: Diagnosis not present

## 2016-08-03 DIAGNOSIS — N8189 Other female genital prolapse: Secondary | ICD-10-CM | POA: Diagnosis not present

## 2016-08-03 MED ORDER — AMLODIPINE BESYLATE 10 MG PO TABS: 10.0000 mg | ORAL_TABLET | Freq: Every day | ORAL | 3 refills | Status: DC

## 2016-08-03 NOTE — Patient Instructions (Addendum)
We will increase norvasc to 10mg .  Come back in 1-2 days to recheck your blood pressure.  Take care,  Dr Jerline Pain    IF you received an x-ray today, you will receive an invoice from [facility]. Please contact [facility] at [phone] with questions or concerns regarding your invoice.   IF you received labwork today, you will receive an invoice from [employer]. Please contact [facility] at [phone] with questions or concerns regarding your invoice.   Our billing staff will not be able to assist you with questions regarding bills from these companies.  You will be contacted with the lab results as soon as they are available. The fastest way to get your results is to activate your My Chart account. Instructions are located on the last page of this paperwork. If you have not heard from [place] regarding the results in 2 weeks, please contact this office.

## 2016-08-03 NOTE — Progress Notes (Signed)
   Amanda Curry is a 65 y.o. female who presents to [facility] today for Hypertension.  1.  Hypertension Sent here by OBGYN because of high blood pressure. Think that it was in the 190 range. Patient reports that her blood pressure usually jumps up when she is at the clinic.  BP Readings from Last 3 Encounters:  08/03/16 (!) 170/100  05/24/16 132/70  10/22/15 132/88   Home BP monitoring-No Compliant with medications-yes, without side effects ROS-Denies any CP, HA, SOB, blurry vision, LE edema, transient weakness, orthopnea, PND.   ROS as above.   PMH reviewed. Patient is a nonsmoker.   Past Medical History:  Diagnosis Date  . Arthritis   . Diverticulosis   . Ganglion of left wrist    thumb joint  . Hyperlipidemia   . Hypertension   . Tinnitus    Past Surgical History:  Procedure Laterality Date  . TONSILLECTOMY    . TUBAL LIGATION      Medications reviewed. Current Outpatient Prescriptions  Medication Sig Dispense Refill  . amLODipine (NORVASC) 2.5 MG tablet Take 1 tablet (2.5 mg total) by mouth daily. 90 tablet 3  . glucosamine-chondroitin 500-400 MG tablet Take 1 tablet by mouth 3 (three) times daily.    Marland Kitchen lisinopril (PRINIVIL,ZESTRIL) 30 MG tablet Take 1 tablet (30 mg total) by mouth daily. 90 tablet 3  . loperamide (IMODIUM A-D) 2 MG tablet Take 2 mg by mouth 4 (four) times daily as needed for diarrhea or loose stools.    . Melatonin 3 MG TABS Take 1 tablet by mouth at bedtime as needed (sleep).    . Multiple Vitamin (MULTIVITAMIN WITH MINERALS) TABS Take 1 tablet by mouth daily.    . Pitavastatin Calcium (LIVALO) 2 MG TABS TAKE 1 TABLET ONCE DAILY 90 tablet 1   No current facility-administered medications for this visit.      Physical Exam:  BP (!) 170/100 (BP Location: Right Arm, Cuff Size: Normal)   Pulse (!) 119   Temp 98.8 F (37.1 C) (Oral)   Resp 16   Ht 5' 1.5" (1.562 m)   Wt 137 lb 3.2 oz (62.2 kg)   SpO2 98%   BMI 25.50  kg/m  Gen:  Alert, cooperative patient who appears stated age in no acute distress.  Vital signs reviewed. HEENT: EOMI,  MMM Pulm:  Clear to auscultation bilaterally with good air movement.  No wheezes or rales noted.   Cardiac:  Regular rate and rhythm without murmur auscultated.  Good S1/S2. Abd:  Soft/nondistended/nontender.  Good bowel sounds throughout all four quadrants.  No masses noted.  Exts: Non edematous BL  LE, warm and well perfused.   Assessment and Plan:  1.  Hypertension Elevated to 170/100 today. Likely has some element of white coat hypertension. Patient is asymptomatic. Will increase norvasc to 10mg  daily. Follow up in 1-2 days for recheck. Consider increasing lisinopril to 40mg  daily vs starting additional agent if still uncontrolled. Will need yearly screening labs at her next visit - patient deferred for today.

## 2016-08-05 ENCOUNTER — Encounter: Payer: Self-pay | Admitting: Family Medicine

## 2016-08-05 ENCOUNTER — Ambulatory Visit (INDEPENDENT_AMBULATORY_CARE_PROVIDER_SITE_OTHER): Payer: PPO | Admitting: Family Medicine

## 2016-08-05 VITALS — BP 130/80 | HR 98 | Temp 98.9°F | Resp 16 | Ht 61.0 in | Wt 138.0 lb

## 2016-08-05 DIAGNOSIS — D649 Anemia, unspecified: Secondary | ICD-10-CM

## 2016-08-05 DIAGNOSIS — Z5181 Encounter for therapeutic drug level monitoring: Secondary | ICD-10-CM

## 2016-08-05 DIAGNOSIS — I10 Essential (primary) hypertension: Secondary | ICD-10-CM

## 2016-08-05 NOTE — Progress Notes (Signed)
Subjective:  By signing my name below, I, Amanda Curry, attest that this documentation has been prepared under the direction and in the presence of [name], MD Electronically Signed: Ladene Artist, ED Scribe 08/05/2016 at 2:14 PM.   Patient ID: Amanda Curry, female    DOB: 1951-06-25, 65 y.o.   MRN: [id number]   Chief Complaint  Patient presents with  . Hypertension    x 2 days   . labwork   HPI HPI Comments: Amanda Curry is a 65 y.o. female, with a h/o HTN, who presents to the [facility] complaining of elevated blood pressure for 2 days. Pt was seen 2 days prior BP 170/100 sent over by her obgyn when systolic was [id number]. Pt endorsed h/o white coat HTN. Norvasc was increased from 5-10 mg. She was continued on lisinopril 30. Her last lipid panel was 1 year prior at goal.   Pt has not noticed any side effects but she has noticed some mild fatigue. She denies leg swelling, HA, floaters, visual disturbances, chest pain, heart racing, appetite change, change in bowels/bladder. Pt has a home BP monitor that she does not currently use but states that she will start using it again.   Past Medical History:  Diagnosis Date  . Arthritis   . Diverticulosis   . Ganglion of left wrist    thumb joint  . Hyperlipidemia   . Hypertension   . Tinnitus    Current Outpatient Prescriptions on File Prior to Visit  Medication Sig Dispense Refill  . amLODipine (NORVASC) 10 MG tablet Take 1 tablet (10 mg total) by mouth daily. 90 tablet 3  . glucosamine-chondroitin 500-400 MG tablet Take 1 tablet by mouth 3 (three) times daily.    Marland Kitchen lisinopril (PRINIVIL,ZESTRIL) 30 MG tablet Take 1 tablet (30 mg total) by mouth daily. 90 tablet 3  . loperamide (IMODIUM A-D) 2 MG tablet Take 2 mg by mouth 4 (four) times daily as needed for diarrhea or loose stools.    . Melatonin 3 MG TABS Take 1 tablet by mouth at bedtime as needed (sleep).    . Multiple Vitamin (MULTIVITAMIN WITH MINERALS) TABS Take  1 tablet by mouth daily.    . Pitavastatin Calcium (LIVALO) 2 MG TABS TAKE 1 TABLET ONCE DAILY 90 tablet 1   No current facility-administered medications on file prior to visit.    Allergies  Allergen Reactions  . Hctz [Hydrochlorothiazide] Other (See Comments)    Causes extreme hyponatremia quickly - 126   Review of Systems  Constitutional: Negative for appetite change.  Eyes: Negative for visual disturbance.  Cardiovascular: Negative for chest pain, palpitations and leg swelling.  Gastrointestinal: Negative for constipation and diarrhea.  Genitourinary: Negative for decreased urine volume, difficulty urinating and frequency.  Neurological: Negative for headaches.      Objective:   Physical Exam  Constitutional: She is oriented to person, place, and time. She appears well-developed and well-nourished. No distress.  HENT:  Head: Normocephalic and atraumatic.  Eyes: Conjunctivae and EOM are normal.  Neck: Neck supple. No tracheal deviation present.  Cardiovascular: Normal rate, regular rhythm and normal heart sounds.   Pulmonary/Chest: Effort normal and breath sounds normal. No respiratory distress.  Musculoskeletal: Normal range of motion.  Neurological: She is alert and oriented to person, place, and time.  Skin: Skin is warm and dry.  Psychiatric: She has a normal mood and affect. Her behavior is normal.  Nursing note and vitals reviewed.  Vitals:  08/05/16 1352 08/05/16 1424  BP: (!) 142/86 130/80  Pulse: (!) 116 98  Resp: 16   Temp: 98.9 F (37.2 C)   TempSrc: Oral   SpO2: 96%   Weight: 138 lb (62.6 kg)   Height: 5\' 1"  (1.549 m)    Assessment & Plan:   1. Essential hypertension, benign - doing well on amlodipine 10mg  so will cont. Cont lisinopril 30. Pt will start monitoring bp outside of office and if symptomatic from dropping to low can decrease amlodipine back to 5.  2. Medication monitoring encounter   3. Anemia, unspecified type - mild, suspect sampling  error, recheck    Orders Placed This Encounter  Procedures  . CBC  . Comprehensive metabolic panel  . Care order/instruction:    AVS printed - let patient go! After labs    I personally performed the services described in this documentation, which was scribed in my presence. The recorded information has been reviewed and considered, and addended by me as needed.   [name], M.D.  [facility] [address] [place], [place] [address] [phone] phone [phone] fax  08/07/16 12:58 AM

## 2016-08-05 NOTE — Patient Instructions (Addendum)
At your next routine visit, please remember to be fasting with nothing to eat or drink other than water for 8 hours prior so that we can check your cholesterol.   IF you received an x-ray today, you will receive an invoice from [facility]. Please contact [facility] at [phone] with questions or concerns regarding your invoice.   IF you received labwork today, you will receive an invoice from [employer]. Please contact [facility] at [phone] with questions or concerns regarding your invoice.   Our billing staff will not be able to assist you with questions regarding bills from these companies.  You will be contacted with the lab results as soon as they are available. The fastest way to get your results is to activate your My Chart account. Instructions are located on the last page of this paperwork. If you have not heard from [place] regarding the results in 2 weeks, please contact this office.      Managing Your Hypertension Hypertension is commonly called high blood pressure. Blood pressure is a measurement of how strongly your blood is pressing against the walls of your arteries. Arteries are blood vessels that carry blood from your heart throughout your body. Blood pressure does not stay the same. It rises when you are active, excited, or nervous. It lowers when you are sleeping or relaxed. If the numbers that measure your blood pressure stay above normal most of the time, you are at risk for health problems. Hypertension is a long-term (chronic) condition in which blood pressure is elevated. This condition often has no signs or symptoms. The cause of the condition is usually not known. What are blood pressure readings? A blood pressure reading is recorded as two numbers, such as "120 over 80" (or 120/80). The first ("top") number is called the systolic pressure. It is a measure of the pressure in your arteries as the heart beats. The second  ("bottom") number is called the diastolic pressure. It is a measure of the pressure in your arteries as the heart relaxes between beats. What does my blood pressure reading mean? Blood pressure is classified into four stages. Based on your blood pressure reading, your health care provider may use the following stages to determine what type of treatment, if any, is needed. Systolic pressure and diastolic pressure are measured in a unit called mm Hg. Normal  Systolic pressure: below 123456.  Diastolic pressure: below 80. Prehypertension  Systolic pressure: 123456.  Diastolic pressure: [id number]. Hypertension stage 1  Systolic pressure: A999333.  Diastolic pressure: A999333. Hypertension stage 2  Systolic pressure: 0000000 or above.  Diastolic pressure: 123XX123 or above. What health risks are associated with hypertension? Managing your hypertension is an important responsibility. Uncontrolled hypertension can lead to:  A heart attack.  A stroke.  A weakened blood vessel (aneurysm).  Heart failure.  Kidney damage.  Eye damage.  Metabolic syndrome.  Memory and concentration problems. What changes can I make to manage my hypertension? Hypertension can be managed effectively by making lifestyle changes and possibly by taking medicines. Your health care provider will help you come up with a plan to bring your blood pressure within a normal range. Your plan should include the following: Monitoring  Monitor your blood pressure at home as told by your health care provider. Your personal target blood pressure may vary depending on your medical conditions, your age, and other factors.  Have your blood pressure rechecked as told by your health care provider. Lifestyle  Lose weight  if necessary.  Get at least 30-45 minutes of aerobic exercise at least 4 times a week.  Do not use any products that contain nicotine or tobacco, such as cigarettes and e-cigarettes. If you need help quitting, ask  your health care provider.  Learn ways to reduce stress.  Control any chronic conditions, such as high cholesterol or diabetes. Eating and drinking  Follow the DASH diet. This diet is high in fruits, vegetables, and whole grains. It is low in salt, red meat, and added sugars.  Keep your sodium intake below 2,300 mg per day.  Limit alcoholic beverages. Communication  Review all the medicines you take with your health care provider because there may be side effects or interactions.  Talk with your health care provider about your diet, exercise habits, and other lifestyle factors that may be contributing to hypertension.  See your health care provider regularly. Your health care provider can help you create and adjust your plan for managing hypertension. Will I need medicine to control my blood pressure? Your health care provider may prescribe medicine if lifestyle changes are not enough to get your blood pressure under control, and if one of the following is true:  You are 58-36 years of age, and your systolic blood pressure is 140 or higher.  You are 59 years of age or older, and your systolic blood pressure is 150 or higher.  Your diastolic blood pressure is 90 or higher.  You have diabetes, and your systolic blood pressure is over [id number] or your diastolic blood pressure is over 90.  You have kidney disease, and your blood pressure is above 140/90.  You have heart disease or a history of stroke, and your blood pressure is 140/90 or higher. Take medicines only as told by your health care provider. Follow the directions carefully. Blood pressure medicines must be taken as prescribed. The medicine does not work as well when you skip doses. Skipping doses also puts you at risk for problems. Contact a health care provider if:  You think you are having a reaction to medicines you have taken.  You have repeated (recurrent) headaches.  You feel dizzy.  You have swelling in your  ankles.  You have trouble with your vision. Get help right away if:  You develop a severe headache or confusion.  You have unusual weakness or numbness, or you feel faint.  You have severe pain in your chest or abdomen.  You vomit repeatedly.  You have trouble breathing. This information is not intended to replace advice given to you by your health care provider. Make sure you discuss any questions you have with your health care provider. Document Released: 05/09/2012 Document Revised: 04/19/2016 Document Reviewed: 11/13/2015 Elsevier Interactive Patient Education  2017 [employer].

## 2016-08-06 LAB — COMPREHENSIVE METABOLIC PANEL
A/G RATIO: 2 (ref 1.2–2.2)
ALBUMIN: 4.3 g/dL (ref 3.6–4.8)
ALK PHOS: 66 IU/L (ref 39–117)
ALT: 13 IU/L (ref 0–32)
AST: 19 IU/L (ref 0–40)
BUN / CREAT RATIO: 35 — AB (ref 12–28)
BUN: 20 mg/dL (ref 8–27)
CO2: 23 mmol/L (ref 18–29)
CREATININE: 0.57 mg/dL (ref 0.57–1.00)
Calcium: 9.1 mg/dL (ref 8.7–10.3)
Chloride: 97 mmol/L (ref 96–106)
GFR calc Af Amer: 111 mL/min/{1.73_m2} (ref >59)
GFR calc non Af Amer: 96 mL/min/{1.73_m2} (ref >59)
GLOBULIN, TOTAL: 2.2 g/dL (ref 1.5–4.5)
Glucose: 88 mg/dL (ref 65–99)
POTASSIUM: 4.6 mmol/L (ref 3.5–5.2)
SODIUM: 135 mmol/L (ref 134–144)
Total Protein: 6.5 g/dL (ref 6.0–8.5)

## 2016-08-06 LAB — CBC
HEMATOCRIT: 36.2 % (ref 34.0–46.6)
HEMOGLOBIN: 12.3 g/dL (ref 11.1–15.9)
MCH: 30.1 pg (ref 26.6–33.0)
MCHC: 34 g/dL (ref 31.5–35.7)
MCV: 89 fL (ref 79–97)
Platelets: 318 10*3/uL (ref 150–379)
RBC: 4.08 x10E6/uL (ref 3.77–5.28)
RDW: 13.3 % (ref 12.3–15.4)
WBC: 6.9 10*3/uL (ref 3.4–10.8)

## 2016-08-08 ENCOUNTER — Encounter: Payer: Self-pay | Admitting: *Deleted

## 2016-10-14 ENCOUNTER — Other Ambulatory Visit: Payer: Self-pay | Admitting: Family Medicine

## 2016-11-04 ENCOUNTER — Other Ambulatory Visit: Payer: Self-pay | Admitting: Family Medicine

## 2016-11-04 DIAGNOSIS — B3731 Acute candidiasis of vulva and vagina: Secondary | ICD-10-CM

## 2016-11-04 DIAGNOSIS — N898 Other specified noninflammatory disorders of vagina: Secondary | ICD-10-CM

## 2016-11-04 DIAGNOSIS — B373 Candidiasis of vulva and vagina: Secondary | ICD-10-CM

## 2016-11-05 ENCOUNTER — Ambulatory Visit (INDEPENDENT_AMBULATORY_CARE_PROVIDER_SITE_OTHER): Payer: PPO | Admitting: Family Medicine

## 2016-11-05 ENCOUNTER — Other Ambulatory Visit: Payer: Self-pay | Admitting: Family Medicine

## 2016-11-05 VITALS — BP 152/88 | HR 105 | Temp 98.1°F | Resp 16 | Ht 62.0 in | Wt 139.5 lb

## 2016-11-05 DIAGNOSIS — B373 Candidiasis of vulva and vagina: Secondary | ICD-10-CM

## 2016-11-05 DIAGNOSIS — B3731 Acute candidiasis of vulva and vagina: Secondary | ICD-10-CM

## 2016-11-05 DIAGNOSIS — N949 Unspecified condition associated with female genital organs and menstrual cycle: Secondary | ICD-10-CM

## 2016-11-05 LAB — POC MICROSCOPIC URINALYSIS (UMFC): MUCUS RE: ABSENT

## 2016-11-05 LAB — POCT WET + KOH PREP: Trich by wet prep: ABSENT

## 2016-11-05 LAB — POCT URINALYSIS DIP (MANUAL ENTRY)
BILIRUBIN UA: NEGATIVE
Bilirubin, UA: NEGATIVE
GLUCOSE UA: NEGATIVE
Nitrite, UA: NEGATIVE
Protein Ur, POC: NEGATIVE
Urobilinogen, UA: 0.2
pH, UA: 5

## 2016-11-05 MED ORDER — FLUCONAZOLE 150 MG PO TABS: 150.0000 mg | ORAL_TABLET | Freq: Once | ORAL | 0 refills | Status: AC

## 2016-11-05 MED ORDER — FLUCONAZOLE 150 MG PO TABS: 150.0000 mg | ORAL_TABLET | Freq: Once | ORAL | 1 refills | Status: DC

## 2016-11-05 NOTE — Patient Instructions (Addendum)
Try topical Monistat or topical clotrimazole twice a day for more immediate symptom relief while the fluconazole is kicking in if you would like.    IF you received an x-ray today, you will receive an invoice from [facility]. Please contact [facility] at [phone] with questions or concerns regarding your invoice.   IF you received labwork today, you will receive an invoice from [place]. Please contact [facility] at [phone] with questions or concerns regarding your invoice.   Our billing staff will not be able to assist you with questions regarding bills from these companies.  You will be contacted with the lab results as soon as they are available. The fastest way to get your results is to activate your My Chart account. Instructions are located on the last page of this paperwork. If you have not heard from [place] regarding the results in 2 weeks, please contact this office.     Vaginal Yeast infection, Adult Vaginal yeast infection is a condition that causes soreness, swelling, and redness (inflammation) of the vagina. It also causes vaginal discharge. This is a common condition. Some women get this infection frequently. What are the causes? This condition is caused by a change in the normal balance of the yeast (candida) and bacteria that live in the vagina. This change causes an overgrowth of yeast, which causes the inflammation. What increases the risk? This condition is more likely to develop in:  Women who take antibiotic medicines.  Women who have diabetes.  Women who take birth control pills.  Women who are pregnant.  Women who douche often.  Women who have a weak defense (immune) system.  Women who have been taking steroid medicines for a long time.  Women who frequently wear tight clothing. What are the signs or symptoms? Symptoms of this condition include:  White, thick vaginal discharge.  Swelling, itching, redness, and irritation of the  vagina. The lips of the vagina (vulva) may be affected as well.  Pain or a burning feeling while urinating.  Pain during sex. How is this diagnosed? This condition is diagnosed with a medical history and physical exam. This will include a pelvic exam. Your health care provider will examine a sample of your vaginal discharge under a microscope. Your health care provider may send this sample for testing to confirm the diagnosis. How is this treated? This condition is treated with medicine. Medicines may be over-the-counter or prescription. You may be told to use one or more of the following:  Medicine that is taken orally.  Medicine that is applied as a cream.  Medicine that is inserted directly into the vagina (suppository). Follow these instructions at home:  Take or apply over-the-counter and prescription medicines only as told by your health care provider.  Do not have sex until your health care provider has approved. Tell your sex partner that you have a yeast infection. That person should go to his or her health care provider if he or she develops symptoms.  Do not wear tight clothes, such as pantyhose or tight pants.  Avoid using tampons until your health care provider approves.  Eat more yogurt. This may help to keep your yeast infection from returning.  Try taking a sitz bath to help with discomfort. This is a warm water bath that is taken while you are sitting down. The water should only come up to your hips and should cover your buttocks. Do this 3-4 times per day or as told by your health care provider.  Do not douche.  Wear breathable, cotton underwear.  If you have diabetes, keep your blood sugar levels under control. Contact a health care provider if:  You have a fever.  Your symptoms go away and then return.  Your symptoms do not get better with treatment.  Your symptoms get worse.  You have new symptoms.  You develop blisters in or around your  vagina.  You have blood coming from your vagina and it is not your menstrual period.  You develop pain in your abdomen. This information is not intended to replace advice given to you by your health care provider. Make sure you discuss any questions you have with your health care provider. Document Released: 05/25/2005 Document Revised: 01/27/2016 Document Reviewed: 02/16/2015 Elsevier Interactive Patient Education  2017 [employer].

## 2016-11-05 NOTE — Progress Notes (Signed)
Subjective:  By signing my name below, I, Amanda Curry, attest that this documentation has been prepared under the direction and in the presence of .Amanda Cheadle, MD Electronically Signed: Ladene Artist, ED Scribe 11/05/2016 at 11:50 AM.   Patient ID: Amanda Curry, female    DOB: [birth year], 66 y.o.   MRN: [id number]  Chief Complaint  Patient presents with  . vaginal burning   HPI Amanda Curry is a 66 y.o. female who presents to Primary Care at [facility] complaining of constant vaginal burning for the past 3-4 days. Pt reports associated symptoms of external vaginal itching and mild vaginal discharge for the past 3-4 days as well. She has tried premarine cream, a sitz bath PTA with some relief and occasionally applying betamethasone cream but not for the past 2 days. Pt denies constipation, anal itching, urinary frequency or dysuria.  Leg Swelling  Pt has also noticed bilateral ankle swelling this morning. No treatments tried PTA.   Past Medical History:  Diagnosis Date  . Arthritis   . Diverticulosis   . Ganglion of left wrist    thumb joint  . Hyperlipidemia   . Hypertension   . Tinnitus    Current Outpatient Prescriptions on File Prior to Visit  Medication Sig Dispense Refill  . amLODipine (NORVASC) 10 MG tablet Take 1 tablet (10 mg total) by mouth daily. 90 tablet 3  . glucosamine-chondroitin 500-400 MG tablet Take 1 tablet by mouth 3 (three) times daily.    Marland Kitchen lisinopril (PRINIVIL,ZESTRIL) 20 MG tablet TAKE 1 & 1/2 TABLETS A DAY. 135 tablet 1  . loperamide (IMODIUM A-D) 2 MG tablet Take 2 mg by mouth 4 (four) times daily as needed for diarrhea or loose stools.    . Melatonin 3 MG TABS Take 1 tablet by mouth at bedtime as needed (sleep).    . Multiple Vitamin (MULTIVITAMIN WITH MINERALS) TABS Take 1 tablet by mouth daily.    . Pitavastatin Calcium (LIVALO) 2 MG TABS TAKE 1 TABLET ONCE DAILY (Patient taking differently: TAKE 1/2  TABLET ONCE DAILY) 90 tablet 1   No current  facility-administered medications on file prior to visit.    Allergies  Allergen Reactions  . Hctz [Hydrochlorothiazide] Other (See Comments)    Causes extreme hyponatremia quickly - 126   Review of Systems  Cardiovascular: Positive for leg swelling (bilateral ankle).  Gastrointestinal: Negative for constipation.  Genitourinary: Positive for vaginal discharge. Negative for dysuria and frequency.      Objective:   Physical Exam  Constitutional: She is oriented to person, place, and time. She appears well-developed and well-nourished. No distress.  HENT:  Head: Normocephalic and atraumatic.  Eyes: Conjunctivae and EOM are normal.  Neck: Neck supple. No tracheal deviation present.  Cardiovascular: Normal rate.   Pulmonary/Chest: Effort normal. No respiratory distress.  Genitourinary:  Genitourinary Comments: Normal perineum and external labia. Vaginal introitus with some dark erythema and thick white clumps. More prevalent around urethral prolapsed around where itching was most prevalent.  Musculoskeletal: Normal range of motion. She exhibits edema (1+ on the L, trace on the R).  Neurological: She is alert and oriented to person, place, and time.  Skin: Skin is warm and dry.  Psychiatric: She has a normal mood and affect. Her behavior is normal.  Nursing note and vitals reviewed.  BP (!) 152/88   Pulse (!) 105   Temp 98.1 F (36.7 C) (Oral)   Resp 16   Ht 5\' 2"  (1.575 m)   Wt  139 lb 8 oz (63.3 kg)   SpO2 98%   BMI 25.51 kg/m     Results for orders placed or performed in visit on 11/05/16  POCT urinalysis dipstick  Result Value Ref Range   Color, UA yellow yellow   Clarity, UA clear clear   Glucose, UA negative negative   Bilirubin, UA negative negative   Ketones, POC UA negative negative   Spec Grav, UA <=1.005    Blood, UA small (A) negative   pH, UA 5.0    Protein Ur, POC negative negative   Urobilinogen, UA 0.2    Nitrite, UA Negative Negative   Leukocytes, UA  Trace (A) Negative  POCT Microscopic Urinalysis ([facility])  Result Value Ref Range   WBC,UR,HPF,POC None None WBC/hpf   RBC,UR,HPF,POC None None RBC/hpf   Bacteria Few (A) None, Too numerous to count   Mucus Absent Absent   Epithelial Cells, UR Per Microscopy Few (A) None, Too numerous to count cells/hpf  POCT Wet + KOH Prep  Result Value Ref Range   Yeast by KOH Present (A) Absent   Yeast by wet prep Present (A) Absent   WBC by wet prep Moderate (A) Few   Clue Cells Wet Prep HPF POC Moderate (A) None   Trich by wet prep Absent Absent   Bacteria Wet Prep HPF POC Many (A) Few   Epithelial Cells By [employer] Pref ([facility]) Few None, Few, Too numerous to count   RBC,UR,HPF,POC Few (A) None RBC/hpf   Assessment & Plan:   1. Vaginal burning   2. Vaginal moniliasis     Orders Placed This Encounter  Procedures  . Care order/instruction:    Scheduling Instructions:     Complete orders, AVS and go.  Marland Kitchen POCT urinalysis dipstick  . POCT Microscopic Urinalysis ([facility])  . POCT Wet + KOH Prep    Meds ordered this encounter  Medications  . DISCONTD: fluconazole (DIFLUCAN) 150 MG tablet    Sig: Take 1 tablet (150 mg total) by mouth once.    Dispense:  1 tablet    Refill:  1  . fluconazole (DIFLUCAN) 150 MG tablet    Sig: Take 1 tablet (150 mg total) by mouth once. Repeat after 3d if symptoms persist    Dispense:  2 tablet    Refill:  0    disreguard prior rx for 1 with 1 refill please. Use this rx instead    I personally performed the services described in this documentation, which was scribed in my presence. The recorded information has been reviewed and considered, and addended by me as needed.   Amanda Curry, M.D.  Primary Care at [facility] [address] [place], [place] [address] [phone] phone [phone] fax  11/25/16 5:08 AM

## 2016-11-15 DIAGNOSIS — N8182 Incompetence or weakening of pubocervical tissue: Secondary | ICD-10-CM | POA: Diagnosis not present

## 2016-11-23 DIAGNOSIS — N950 Postmenopausal bleeding: Secondary | ICD-10-CM | POA: Diagnosis not present

## 2016-12-02 ENCOUNTER — Ambulatory Visit (INDEPENDENT_AMBULATORY_CARE_PROVIDER_SITE_OTHER): Payer: PPO | Admitting: Family Medicine

## 2016-12-02 VITALS — BP 132/98 | HR 79 | Temp 97.7°F | Resp 18 | Ht 62.0 in | Wt 138.8 lb

## 2016-12-02 DIAGNOSIS — Z79899 Other long term (current) drug therapy: Secondary | ICD-10-CM

## 2016-12-02 DIAGNOSIS — B373 Candidiasis of vulva and vagina: Secondary | ICD-10-CM

## 2016-12-02 DIAGNOSIS — E785 Hyperlipidemia, unspecified: Secondary | ICD-10-CM | POA: Diagnosis not present

## 2016-12-02 DIAGNOSIS — L298 Other pruritus: Secondary | ICD-10-CM | POA: Diagnosis not present

## 2016-12-02 DIAGNOSIS — N898 Other specified noninflammatory disorders of vagina: Secondary | ICD-10-CM

## 2016-12-02 DIAGNOSIS — I10 Essential (primary) hypertension: Secondary | ICD-10-CM

## 2016-12-02 DIAGNOSIS — B3731 Acute candidiasis of vulva and vagina: Secondary | ICD-10-CM

## 2016-12-02 LAB — POCT URINALYSIS DIP (MANUAL ENTRY)
BILIRUBIN UA: NEGATIVE
GLUCOSE UA: NEGATIVE
Ketones, POC UA: NEGATIVE
NITRITE UA: NEGATIVE
Protein Ur, POC: NEGATIVE
SPEC GRAV UA: 1.01 (ref 1.030–1.035)
Urobilinogen, UA: 0.2 (ref ?–2.0)
pH, UA: 7 (ref 5.0–8.0)

## 2016-12-02 LAB — LIPID PANEL
Chol/HDL Ratio: 3.1 ratio (ref 0.0–4.4)
Cholesterol, Total: 218 mg/dL — ABNORMAL HIGH (ref 100–199)
HDL: 70 mg/dL (ref >39)
LDL Calculated: 118 mg/dL — ABNORMAL HIGH (ref 0–99)
Triglycerides: 148 mg/dL (ref 0–149)
VLDL CHOLESTEROL CAL: 30 mg/dL (ref 5–40)

## 2016-12-02 LAB — COMPREHENSIVE METABOLIC PANEL
A/G RATIO: 1.6 (ref 1.2–2.2)
ALT: 15 IU/L (ref 0–32)
AST: 20 IU/L (ref 0–40)
Albumin: 4.2 g/dL (ref 3.6–4.8)
Alkaline Phosphatase: 63 IU/L (ref 39–117)
BUN/Creatinine Ratio: 23 (ref 12–28)
BUN: 15 mg/dL (ref 8–27)
Bilirubin Total: 0.3 mg/dL (ref 0.0–1.2)
CALCIUM: 9.6 mg/dL (ref 8.7–10.3)
CO2: 22 mmol/L (ref 18–29)
CREATININE: 0.65 mg/dL (ref 0.57–1.00)
Chloride: 102 mmol/L (ref 96–106)
GFR, EST AFRICAN AMERICAN: 105 mL/min/{1.73_m2} (ref >59)
GFR, EST NON AFRICAN AMERICAN: 92 mL/min/{1.73_m2} (ref >59)
Globulin, Total: 2.7 g/dL (ref 1.5–4.5)
Glucose: 105 mg/dL — ABNORMAL HIGH (ref 65–99)
POTASSIUM: 4.2 mmol/L (ref 3.5–5.2)
Sodium: 137 mmol/L (ref 134–144)
TOTAL PROTEIN: 6.9 g/dL (ref 6.0–8.5)

## 2016-12-02 LAB — POC MICROSCOPIC URINALYSIS (UMFC): Mucus: ABSENT

## 2016-12-02 LAB — POCT WET + KOH PREP
Trich by wet prep: ABSENT
YEAST BY KOH: ABSENT

## 2016-12-02 MED ORDER — LISINOPRIL 20 MG PO TABS: 40.0000 mg | ORAL_TABLET | Freq: Every day | ORAL | 0 refills | Status: DC

## 2016-12-02 MED ORDER — SPIRONOLACTONE 25 MG PO TABS: 25.0000 mg | ORAL_TABLET | Freq: Every day | ORAL | 0 refills | Status: DC

## 2016-12-02 MED ORDER — AMLODIPINE BESYLATE 10 MG PO TABS: 5.0000 mg | ORAL_TABLET | Freq: Every day | ORAL | 1 refills | Status: DC

## 2016-12-02 MED ORDER — FLUCONAZOLE 150 MG PO TABS: ORAL_TABLET | ORAL | 2 refills | Status: DC

## 2016-12-02 MED ORDER — CLOTRIMAZOLE-BETAMETHASONE 1-0.05 % EX CREA: 1.0000 "application " | TOPICAL_CREAM | Freq: Two times a day (BID) | CUTANEOUS | 0 refills | Status: DC

## 2016-12-02 NOTE — Patient Instructions (Addendum)
Increase your lisinopril to 2 tabs a day.. Cut to your amlodipine in half.  Start the spironolactone.  Recheck in 2 weeks for repeat blood pressure and repeat your sodium and potassium levels. If you could check your blood pressure outside the office to to 3 times a week the week prior to our visit, and I be great.   Use the Lotrisone for vaginal itching as a Premarin side effect instead of the betamethasone.  IF you received an x-ray today, you will receive an invoice from [facility]. Please contact [facility] at [phone] with questions or concerns regarding your invoice.   IF you received labwork today, you will receive an invoice from [place]. Please contact [facility] at [phone] with questions or concerns regarding your invoice.   Our billing staff will not be able to assist you with questions regarding bills from these companies.  You will be contacted with the lab results as soon as they are available. The fastest way to get your results is to activate your My Chart account. Instructions are located on the last page of this paperwork. If you have not heard from [place] regarding the results in 2 weeks, please contact this office.    Managing Your Hypertension Hypertension is commonly called high blood pressure. This is when the force of your blood pressing against the walls of your arteries is too strong. Arteries are blood vessels that carry blood from your heart throughout your body. Hypertension forces the heart to work harder to pump blood, and may cause the arteries to become narrow or stiff. Having untreated or uncontrolled hypertension can cause heart attack, stroke, kidney disease, and other problems. What are blood pressure readings? A blood pressure reading consists of a higher number over a lower number. Ideally, your blood pressure should be below 120/80. The first ("top") number is called the systolic pressure. It is a measure of the pressure in your  arteries as your heart beats. The second ("bottom") number is called the diastolic pressure. It is a measure of the pressure in your arteries as the heart relaxes. What does my blood pressure reading mean? Blood pressure is classified into four stages. Based on your blood pressure reading, your health care provider may use the following stages to determine what type of treatment you need, if any. Systolic pressure and diastolic pressure are measured in a unit called mm Hg. Normal   Systolic pressure: below 563.  Diastolic pressure: below 80. Elevated   Systolic pressure: 875-643.  Diastolic pressure: below 80. Hypertension stage 1     Diastolic pressure: 32-95. Hypertension stage 2   Systolic pressure: 188 or above.  Diastolic pressure: 90 or above. What health risks are associated with hypertension? Managing your hypertension is an important responsibility. Uncontrolled hypertension can lead to:  A heart attack.  A stroke.  A weakened blood vessel (aneurysm).  Heart failure.  Kidney damage.  Eye damage.  Metabolic syndrome.  Memory and concentration problems. What changes can I make to manage my hypertension? Eating and drinking   Eat a diet that is high in fiber and potassium, and low in salt (sodium), added sugar, and fat. An example eating plan is called the DASH (Dietary Approaches to Stop Hypertension) diet. To eat this way:  Eat plenty of fresh fruits and vegetables. Try to fill half of your plate at each meal with fruits and vegetables.  Eat whole grains, such as whole wheat pasta, brown rice, or whole grain bread. Fill about one quarter  of your plate with whole grains.  Eat low-fat diary products.  Avoid fatty cuts of meat, processed or cured meats, and poultry with skin. Fill about one quarter of your plate with lean proteins such as fish, chicken without skin, beans, eggs, and tofu.  Avoid premade and processed foods. These tend to be higher in sodium,  added sugar, and fat.     Lifestyle   Work with your health care provider to maintain a healthy body weight, or to lose weight. Ask what an ideal weight is for you.  Get at least 30 minutes of exercise that causes your heart to beat faster (aerobic exercise) most days of the week. Activities may include walking, swimming, or biking.       Monitoring   Monitor your blood pressure at home as told by your health care provider. Your personal target blood pressure may vary depending on your medical conditions, your age, and other factors.  Have your blood pressure checked regularly, as often as told by your health care provider. Working with your health care provider   Review all the medicines you take with your health care provider because there may be side effects or interactions.  Talk with your health care provider about your diet, exercise habits, and other lifestyle factors that may be contributing to hypertension.  Visit your health care provider regularly. Your health care provider can help you create and adjust your plan for managing hypertension. Will I need medicine to control my blood pressure? Your health care provider may prescribe medicine if lifestyle changes are not enough to get your blood pressure under control, and if:  Your systolic blood pressure is 130 or higher.  Your diastolic blood pressure is 80 or higher. Take medicines only as told by your health care provider. Follow the directions carefully. Blood pressure medicines must be taken as prescribed. The medicine does not work as well when you skip doses. Skipping doses also puts you at risk for problems. Contact a health care provider if:  You think you are having a reaction to medicines you have taken.  You have repeated (recurrent) headaches.  You feel dizzy.  You have swelling in your ankles.  You have trouble with your vision. Get help right away if:  You develop a severe headache or  confusion.  You have unusual weakness or numbness, or you feel faint.  You have severe pain in your chest or abdomen.  You vomit repeatedly.  You have trouble breathing. Summary  Hypertension is when the force of blood pumping through your arteries is too strong. If this condition is not controlled, it may put you at risk for serious complications.  Your personal target blood pressure may vary depending on your medical conditions, your age, and other factors. For most people, a normal blood pressure is less than 120/80.  Hypertension is managed by lifestyle changes, medicines, or both. Lifestyle changes include weight loss, eating a healthy, low-sodium diet, exercising more, and limiting alcohol. This information is not intended to replace advice given to you by your health care provider. Make sure you discuss any questions you have with your health care provider. Document Released: 05/09/2012 Document Revised: 07/13/2016 Document Reviewed: 07/13/2016 Elsevier Interactive Patient Education  2017 [employer].

## 2016-12-02 NOTE — Progress Notes (Signed)
Subjective:  By signing my name below, I, Amanda Curry, attest that this documentation has been prepared under the direction and in the presence of [name], MD Electronically Signed: Ladene Artist, ED Scribe 12/02/2016 at 10:22 AM.   Patient ID: Amanda Curry, female    DOB: 1951-03-05, 66 y.o.   MRN: [id number]  Chief Complaint  Patient presents with  . Medication Problem    Retaining Fluid, Hypertension  . Labs Only    Cholesterol, Fasting Pt has had black coffeee and water  . Follow-up    Yeast Infection   HPI Amanda Curry is a 66 y.o. female who presents to Primary Care at [facility] for a follow-up on yeast infection. Pt states that vaginal itching initially improved for 2 days, however, she noticed itching returned 2 days ago. She suspects that she is having an allergic reaction to betamethasone cream which she uses for itching following the premarin cream that she uses once a week. Pt last use the betamethasone cream yesterday. She denies vaginal discharge, difficulty urinating, decreased urine output.   HTN Pt has noticed some leg swelling. She does not check her BP outside of the office. Triage BP: 166/99.  HLD: Pt has been on Livalo > 4 yrs (prior to transfer of care here in 2014). She had failed 3 prior cholesterol meds due to side effects and can only tolerate low dose Livalo (taken daily?). No cardiac risk factors other than age and uncontrolled HTN.  Lipids have always been very well controlled in Epic (since 05/2013) with LDL 89-118.  Past Medical History:  Diagnosis Date  . Arthritis   . Diverticulosis   . Ganglion of left wrist    thumb joint  . Hyperlipidemia   . Hypertension   . Tinnitus    Current Outpatient Prescriptions on File Prior to Visit  Medication Sig Dispense Refill  . amLODipine (NORVASC) 10 MG tablet Take 1 tablet (10 mg total) by mouth daily. 90 tablet 3  . fluconazole (DIFLUCAN) 150 MG tablet TAKE 1 TABLET NOW REPEAT DOSE IF NEEDED. 2 tablet 0    . glucosamine-chondroitin 500-400 MG tablet Take 1 tablet by mouth 3 (three) times daily.    Marland Kitchen lisinopril (PRINIVIL,ZESTRIL) 20 MG tablet TAKE 1 & 1/2 TABLETS A DAY. 135 tablet 1  . LIVALO 2 MG TABS TAKE 1 TABLET ONCE DAILY. (Patient taking differently: TAKE 1/2 TABLET ONCE DAILY.) 90 tablet 1  . loperamide (IMODIUM A-D) 2 MG tablet Take 2 mg by mouth 4 (four) times daily as needed for diarrhea or loose stools.    . Melatonin 3 MG TABS Take 1 tablet by mouth at bedtime as needed (sleep).    . Multiple Vitamin (MULTIVITAMIN WITH MINERALS) TABS Take 1 tablet by mouth daily.     No current facility-administered medications on file prior to visit.    Allergies  Allergen Reactions  . Hctz [Hydrochlorothiazide] Other (See Comments)    Causes extreme hyponatremia quickly - 126   Past Surgical History:  Procedure Laterality Date  . TONSILLECTOMY    . TUBAL LIGATION     Family History  Problem Relation Age of Onset  . Colon cancer Maternal Grandmother   . Prostate cancer Father   . Alzheimer's disease Mother   . Hypertension Sister    Social History   Social History  . Marital status: Married    Spouse name: Amanda Curry  . Number of children: 4  . Years of education: N/A   Occupational History  .  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 Smoking status: Never Smoker  . Smokeless tobacco: Never Used  . Alcohol use Yes     Comment: rarely  . Drug use: No  . Sexual activity: Yes   Other Topics Concern  . None   Social History Narrative   Lives with her husband.   Adult children. Two live in [place].   Depression screen [facility] 2/9 12/02/2016 11/05/2016 08/05/2016 08/03/2016 05/24/2016  Decreased Interest 0 0 0 0 0  Down, Depressed, Hopeless 0 0 0 0 0  PHQ - 2 Score 0 0 0 0 0    Review of Systems  Cardiovascular: Positive for leg swelling.  Genitourinary: Negative for decreased urine volume, difficulty urinating and vaginal discharge.        +Vaginal itching      Objective:   Physical Exam  Constitutional: She is oriented to person, place, and time. She appears well-developed and well-nourished. No distress.  HENT:  Head: Normocephalic and atraumatic.  Eyes: Conjunctivae and EOM are normal.  Neck: Neck supple. No tracheal deviation present.  Cardiovascular: Normal rate and regular rhythm.   Pulmonary/Chest: Effort normal. No respiratory distress.  Genitourinary:  Genitourinary Comments: Chaperone present. Mild vaginal prolapse. White coating well defined macular area on posterior vaginal wall. Pruritus limited to vaginal introitus. Normal perineum.  Musculoskeletal: Normal range of motion.  Neurological: She is alert and oriented to person, place, and time.  Skin: Skin is warm and dry.  Psychiatric: She has a normal mood and affect. Her behavior is normal.  Nursing note and vitals reviewed.  BP (!) 166/99   Pulse 79   Temp 97.7 F (36.5 C) (Oral)   Resp 18   Ht 5\' 2"  (1.575 m)   Wt 138 lb 12.8 oz (63 kg)   SpO2 99%   BMI 25.39 kg/m     Assessment & Plan:   1. Essential hypertension - having some pedal edema from the higher 10mg  dose of amlodipine so decrease to 5mg . Increase lisinopril from 30 to 40mg  to help control BP and start spironolactone to help get BP to goal and w/ fluid retention hopefully while avoiding the severe hyponatremia (Na 125) she had with hctz - because of this hx will make sure to recheck bmp soon in OV within sev wks.   2. Hyperlipidemia, unspecified hyperlipidemia type - we do not know what pt's lipids were off of statin but having no side effects at current Livalo dose and lipids well controlled with excellent HDL for 3.5 yrs so will cont.  3. Medication management   4. Vaginal pruritus   5. Vagina itching   6. Monilial vaginitis - recurrent secondary to topical steroid cream (rx'ed by gyn) use to treat occ itching which is from the occ top premarin which is needed secondary to irritation of  the vaginal mucosa from the pessary.  We are now treating a side effect of a side effects of a side effects of a medical intervention. . .   I think it is odd that she is reporting use of a topical steroid to treat side effects from topical vaginal estrogen so try lotrisone instead of betamethasone for this.  Did give a few refills on the one time dose of fluconazole as she has been seen several times for this and each time wet prep does confirm yeast.    Orders Placed This Encounter  Procedures  . Urine culture  .  Comprehensive metabolic panel    Order Specific Question:   Has the patient fasted?    Answer:   Yes  . Lipid panel    Order Specific Question:   Has the patient fasted?    Answer:   Yes  . Care order/instruction:    AVS printed - let patient go!  Marland Kitchen POCT urinalysis dipstick  . POCT Microscopic Urinalysis ([facility])  . POCT Wet + KOH Prep    Meds ordered this encounter  Medications  . lisinopril (PRINIVIL,ZESTRIL) 20 MG tablet    Sig: Take 2 tablets (40 mg total) by mouth daily.    Dispense:  135 tablet    Refill:  0  . amLODipine (NORVASC) 10 MG tablet    Sig: Take 0.5 tablets (5 mg total) by mouth daily.    Dispense:  90 tablet    Refill:  1  . fluconazole (DIFLUCAN) 150 MG tablet    Sig: TAKE 1 TABLET NOW REPEAT DOSE IF NEEDED after 3 d.    Dispense:  2 tablet    Refill:  2  . spironolactone (ALDACTONE) 25 MG tablet    Sig: Take 1 tablet (25 mg total) by mouth daily.    Dispense:  30 tablet    Refill:  0  . clotrimazole-betamethasone (LOTRISONE) cream    Sig: Apply 1 application topically 2 (two) times daily. As needed for itching from premarin side effect    Dispense:  45 g    Refill:  0    I personally performed the services described in this documentation, which was scribed in my presence. The recorded information has been reviewed and considered, and addended by me as needed.   [name], M.D.  Primary Care at [facility] [place], [place] [address] [phone] phone [phone] fax  12/03/16 6:46 PM   Results for orders placed or performed in visit on 12/02/16  Urine culture  Result Value Ref Range   Urine Culture, Routine Final report    Urine Culture result 1 No growth   Comprehensive metabolic panel  Result Value Ref Range   Glucose 105 (H) 65 - 99 mg/dL   BUN 15 8 - 27 mg/dL   Creatinine, Ser 0.65 0.57 - 1.00 mg/dL   GFR calc non Af Amer 92 >59 mL/min/1.73   GFR calc Af Amer 105 >59 mL/min/1.73   BUN/Creatinine Ratio 23 12 - 28   Sodium 137 134 - 144 mmol/L   Potassium 4.2 3.5 - 5.2 mmol/L   Chloride 102 96 - 106 mmol/L   CO2 22 18 - 29 mmol/L   Calcium 9.6 8.7 - 10.3 mg/dL   Total Protein 6.9 6.0 - 8.5 g/dL   Albumin 4.2 3.6 - 4.8 g/dL   Globulin, Total 2.7 1.5 - 4.5 g/dL   Albumin/Globulin Ratio 1.6 1.2 - 2.2   Bilirubin Total 0.3 0.0 - 1.2 mg/dL   Alkaline Phosphatase 63 39 - 117 IU/L   AST 20 0 - 40 IU/L   ALT 15 0 - 32 IU/L  Lipid panel  Result Value Ref Range   Cholesterol, Total 218 (H) 100 - 199 mg/dL   Triglycerides 148 0 - 149 mg/dL   HDL 70 >39 mg/dL   VLDL Cholesterol Cal 30 5 - 40 mg/dL   LDL Calculated 118 (H) 0 - 99 mg/dL   Chol/HDL Ratio 3.1 0.0 - 4.4 ratio  POCT urinalysis dipstick  Result Value Ref Range   Color, UA  yellow yellow   Clarity, UA cloudy (A) clear   Glucose, UA negative negative   Bilirubin, UA negative negative   Ketones, POC UA negative negative   Spec Grav, UA 1.010 1.030 - 1.035   Blood, UA small (A) negative   pH, UA 7.0 5.0 - 8.0   Protein Ur, POC negative negative   Urobilinogen, UA 0.2 Negative - 2.0   Nitrite, UA Negative Negative   Leukocytes, UA small (1+) (A) Negative  POCT Microscopic Urinalysis ([facility])  Result Value Ref Range   WBC,UR,HPF,POC Moderate (A) None WBC/hpf   RBC,UR,HPF,POC None None RBC/hpf   Bacteria Many (A) None, Too numerous to count   Mucus Absent Absent   Epithelial Cells, UR Per Microscopy Few (A)  None, Too numerous to count cells/hpf  POCT Wet + KOH Prep  Result Value Ref Range   Yeast by KOH Absent Absent   Yeast by wet prep Present (A) Absent   WBC by wet prep Many (A) Few   Clue Cells Wet Prep HPF POC None None   Trich by wet prep Absent Absent   Bacteria Wet Prep HPF POC Many (A) Few   Epithelial Cells By [employer] Pref ([facility]) Moderate (A) None, Few, Too numerous to count   RBC,UR,HPF,POC None None RBC/hpf

## 2016-12-03 ENCOUNTER — Encounter: Payer: Self-pay | Admitting: Family Medicine

## 2016-12-03 LAB — URINE CULTURE: Organism ID, Bacteria: NO GROWTH

## 2016-12-03 MED ORDER — PITAVASTATIN CALCIUM 2 MG PO TABS: 0.5000 | ORAL_TABLET | Freq: Every day | ORAL | 1 refills | Status: DC

## 2016-12-03 NOTE — Assessment & Plan Note (Signed)
we do not know what pt's lipids were off of statin but having no side effects at current Livalo dose and lipids well controlled with excellent HDL for 3.5 yrs so will cont.

## 2016-12-15 ENCOUNTER — Encounter: Payer: Self-pay | Admitting: Family Medicine

## 2016-12-15 ENCOUNTER — Ambulatory Visit (INDEPENDENT_AMBULATORY_CARE_PROVIDER_SITE_OTHER): Payer: PPO | Admitting: Family Medicine

## 2016-12-15 VITALS — BP 118/75 | HR 75 | Temp 97.7°F | Resp 16 | Ht 62.0 in | Wt 137.0 lb

## 2016-12-15 DIAGNOSIS — Z5181 Encounter for therapeutic drug level monitoring: Secondary | ICD-10-CM

## 2016-12-15 DIAGNOSIS — R600 Localized edema: Secondary | ICD-10-CM | POA: Diagnosis not present

## 2016-12-15 DIAGNOSIS — E785 Hyperlipidemia, unspecified: Secondary | ICD-10-CM

## 2016-12-15 DIAGNOSIS — R7309 Other abnormal glucose: Secondary | ICD-10-CM | POA: Diagnosis not present

## 2016-12-15 DIAGNOSIS — I10 Essential (primary) hypertension: Secondary | ICD-10-CM | POA: Diagnosis not present

## 2016-12-15 NOTE — Patient Instructions (Signed)
     IF you received an x-ray today, you will receive an invoice from [facility]. Please contact [facility] at [phone] with questions or concerns regarding your invoice.   IF you received labwork today, you will receive an invoice from [facility]. Please contact [facility] at [phone] with questions or concerns regarding your invoice.   Our billing staff will not be able to assist you with questions regarding bills from these companies.  You will be contacted with the lab results as soon as they are available. The fastest way to get your results is to activate your My Chart account. Instructions are located on the last page of this paperwork. If you have not heard from us regarding the results in 2 weeks, please contact this office.     

## 2016-12-15 NOTE — Progress Notes (Signed)
Subjective:    Patient ID: Amanda Curry, female    DOB: 1951-04-23, 66 y.o.   MRN: [id number] Chief Complaint  Patient presents with  . Follow-up    HTN    HPI  Amanda Curry is delightful 66 yo woman here today to follow-up on her pedal edema.   HTN and Pedal Edema: Patient was seen 2 weeks ago when she had noted new lower extremity edema bilaterally. She does not check her blood pressure outside office but pressures were noted to be significantly elevated at initially 166/99 which then decreased to 132/98. I was concerned that the edema could be from the amlodipine 10 mg so this was decreased to 5 mg and her lisinopril was increased from 30-40 mg. She was also started on spironolactone as she had severe hyponatremia with HCTZ prior. Not checking BP outside of office.  Hyperlipidemia, unspecified hyperlipidemia type - we do not know what pt's lipids were off of statin but having no side effects at current Livalo dose and lipids well controlled with excellent HDL for 3.5 yrs so will cont.  Past Medical History:  Diagnosis Date  . Arthritis   . Diverticulosis   . Ganglion of left wrist    thumb joint  . Hyperlipidemia   . Hypertension   . Tinnitus    Past Surgical History:  Procedure Laterality Date  . TONSILLECTOMY    . TUBAL LIGATION     Current Outpatient Prescriptions on File Prior to Visit  Medication Sig Dispense Refill  . amLODipine (NORVASC) 10 MG tablet Take 0.5 tablets (5 mg total) by mouth daily. 90 tablet 1  . clotrimazole-betamethasone (LOTRISONE) cream Apply 1 application topically 2 (two) times daily. As needed for itching from premarin side effect 45 g 0  . glucosamine-chondroitin 500-400 MG tablet Take 1 tablet by mouth 3 (three) times daily.    Marland Kitchen lisinopril (PRINIVIL,ZESTRIL) 20 MG tablet Take 2 tablets (40 mg total) by mouth daily. 135 tablet 0  . loperamide (IMODIUM A-D) 2 MG tablet Take 2 mg by mouth 4 (four) times daily as needed for diarrhea or loose stools.      . Melatonin 3 MG TABS Take 1 tablet by mouth at bedtime as needed (sleep).    . Multiple Vitamin (MULTIVITAMIN WITH MINERALS) TABS Take 1 tablet by mouth daily.    . Pitavastatin Calcium (LIVALO) 2 MG TABS Take 0.5 tablets (1 mg total) by mouth daily. 90 tablet 1  . fluconazole (DIFLUCAN) 150 MG tablet TAKE 1 TABLET NOW REPEAT DOSE IF NEEDED after 3 d. (Patient not taking: Reported on 12/15/2016) 2 tablet 2   No current facility-administered medications on file prior to visit.    Allergies  Allergen Reactions  . Hctz [Hydrochlorothiazide] Other (See Comments)    Causes extreme hyponatremia quickly - 126   Family History  Problem Relation Age of Onset  . Colon cancer Maternal Grandmother   . Prostate cancer Father   . Alzheimer's disease Mother   . Hypertension Sister    Social History   Social History  . Marital status: Married    Spouse name: [name]  . Number of children: 4  . Years of education: N/A   Occupational History  . 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 Smoking status: Never Smoker  . Smokeless tobacco: Never Used  . Alcohol use Yes     Comment: rarely  . Drug use: No  . Sexual activity: Yes  Other Topics Concern  . None   Social History Narrative   Lives with her husband.   Adult children. Two live in [place].   Depression screen [facility] 2/9 12/15/2016 12/02/2016 11/05/2016 08/05/2016 08/03/2016  Decreased Interest 0 0 0 0 0  Down, Depressed, Hopeless 0 0 0 0 0  PHQ - 2 Score 0 0 0 0 0    Review of Systems See hpi    Objective:   Physical Exam  Constitutional: She is oriented to person, place, and time. She appears well-developed and well-nourished. No distress.  HENT:  Head: Normocephalic and atraumatic.  Right Ear: External ear normal.  Left Ear: External ear normal.  Eyes: Conjunctivae are normal. No scleral icterus.  Neck: Normal range of motion. Neck supple. No thyromegaly present.  Cardiovascular: Normal  rate, regular rhythm, normal heart sounds and intact distal pulses.   Pulmonary/Chest: Effort normal and breath sounds normal. No respiratory distress.  Musculoskeletal: She exhibits no edema.  Lymphadenopathy:    She has no cervical adenopathy.  Neurological: She is alert and oriented to person, place, and time.  Skin: Skin is warm and dry. She is not diaphoretic. No erythema.  Psychiatric: She has a normal mood and affect. Her behavior is normal.      BP 118/75   Pulse 75   Temp 97.7 F (36.5 C) (Oral)   Resp 16   Ht 5\' 2"  (1.575 m)   Wt 137 lb (62.1 kg)   SpO2 96%   BMI 25.06 kg/m      Assessment & Plan:   1. Essential hypertension - doing great. Cont spironolactone 25, lisinopril 40, and amlodipine 5  2. Medication monitoring encounter   3. Pedal edema - resolved with starting spironolactone and cutting amlodipine form 10 -> 5.  4. Hyperlipidemia, unspecified hyperlipidemia type - well controlled, cont livalo 1mg . Refill x 6 mos prn  5. Elevated glucose     Orders Placed This Encounter  Procedures  . Basic metabolic panel    Order Specific Question:   Has the patient fasted?    Answer:   No  . Hemoglobin A1c   Has not had AWV/CPE prior in Epic - f/u in 6 mos for Initial AWV  [name], M.D.  Primary Care at [facility] [address] [place], [place] [address] [phone] phone [phone] fax  01/03/17 12:03 AM    Results for orders placed or performed in visit on 94/70/96  Basic metabolic panel  Result Value Ref Range   Glucose 94 65 - 99 mg/dL   BUN 16 8 - 27 mg/dL   Creatinine, Ser 0.77 0.57 - 1.00 mg/dL   GFR calc non Af Amer 80 >59 mL/min/1.73   GFR calc Af Amer 92 >59 mL/min/1.73   BUN/Creatinine Ratio 21 12 - 28   Sodium 134 134 - 144 mmol/L   Potassium 4.4 3.5 - 5.2 mmol/L   Chloride 94 (L) 96 - 106 mmol/L   CO2 26 18 - 29 mmol/L   Calcium 9.8 8.7 - 10.3 mg/dL  Hemoglobin A1c  Result Value Ref Range   Hgb A1c MFr Bld 5.7 (H)  4.8 - 5.6 %   Est. average glucose Bld gHb Est-mCnc 117 mg/dL

## 2016-12-16 LAB — BASIC METABOLIC PANEL
BUN / CREAT RATIO: 21 (ref 12–28)
BUN: 16 mg/dL (ref 8–27)
CALCIUM: 9.8 mg/dL (ref 8.7–10.3)
CHLORIDE: 94 mmol/L — AB (ref 96–106)
CO2: 26 mmol/L (ref 18–29)
Creatinine, Ser: 0.77 mg/dL (ref 0.57–1.00)
GFR calc Af Amer: 92 mL/min/{1.73_m2} (ref >59)
GFR calc non Af Amer: 80 mL/min/{1.73_m2} (ref >59)
GLUCOSE: 94 mg/dL (ref 65–99)
Potassium: 4.4 mmol/L (ref 3.5–5.2)
Sodium: 134 mmol/L (ref 134–144)

## 2016-12-16 LAB — HEMOGLOBIN A1C
Est. average glucose Bld gHb Est-mCnc: 117 mg/dL
HEMOGLOBIN A1C: 5.7 % — AB (ref 4.8–5.6)

## 2016-12-20 ENCOUNTER — Other Ambulatory Visit: Payer: Self-pay | Admitting: Family Medicine

## 2017-01-03 ENCOUNTER — Encounter: Payer: Self-pay | Admitting: Family Medicine

## 2017-01-06 ENCOUNTER — Telehealth: Payer: Self-pay | Admitting: *Deleted

## 2017-01-06 NOTE — Telephone Encounter (Signed)
Patient notified by letter

## 2017-03-08 DIAGNOSIS — N8189 Other female genital prolapse: Secondary | ICD-10-CM | POA: Diagnosis not present

## 2017-03-09 ENCOUNTER — Other Ambulatory Visit: Payer: Self-pay | Admitting: Family Medicine

## 2017-05-05 ENCOUNTER — Ambulatory Visit (INDEPENDENT_AMBULATORY_CARE_PROVIDER_SITE_OTHER): Payer: PPO

## 2017-05-05 VITALS — BP 131/88 | HR 97 | Temp 97.9°F | Ht 62.0 in | Wt 133.5 lb

## 2017-05-05 DIAGNOSIS — Z0000 Encounter for general adult medical examination without abnormal findings: Secondary | ICD-10-CM

## 2017-05-05 NOTE — Patient Instructions (Addendum)
Amanda Curry , Thank you for taking time to come for your Medicare Wellness Visit. I appreciate your ongoing commitment to your health goals. Please review the following plan we discussed and let me know if I can assist you in the future.   Screening recommendations/referrals: Colonoscopy: up to date, next due 10/21/2024 Mammogram: up to date, next due 08/03/2018 Bone Density: up to date, you stated you had this last year at Dr. Radene Knee. Recommended yearly ophthalmology/optometry visit for glaucoma screening and checkup Recommended yearly dental visit for hygiene and checkup  Vaccinations: Influenza vaccine: You will wait and get this in October. Pneumococcal vaccine: up to date Tdap vaccine: declined due to insurance Shingles vaccine: up to date, you stated you received this last year at [facility].  Advanced directives: Please bring a copy of your POA (Power of Attorney) and/or Living Will to your next appointment.   Conditions/risks identified: Try to exercise at least 3 times a week for 30 minutes on your treadmill.  Next appointment: schedule follow up with pcp, 1 year for AWV   Preventive Care 68 Years and Older, Female Preventive care refers to lifestyle choices and visits with your health care provider that can promote health and wellness. What does preventive care include?  A yearly physical exam. This is also called an annual well check.  Dental exams once or twice a year.  Routine eye exams. Ask your health care provider how often you should have your eyes checked.  Personal lifestyle choices, including:  Daily care of your teeth and gums.  Regular physical activity.  Eating a healthy diet.  Avoiding tobacco and drug use.  Limiting alcohol use.  Practicing safe sex.  Taking low-dose aspirin every day.  Taking vitamin and mineral supplements as recommended by your health care provider. What happens during an annual well check? The services and screenings done by  your health care provider during your annual well check will depend on your age, overall health, lifestyle risk factors, and family history of disease. Counseling  Your health care provider may ask you questions about your:  Alcohol use.  Tobacco use.  Drug use.  Emotional well-being.  Home and relationship well-being.  Sexual activity.  Eating habits.  History of falls.  Memory and ability to understand (cognition).  Work and work Statistician.  Reproductive health. Screening  You may have the following tests or measurements:  Height, weight, and BMI.  Blood pressure.  Lipid and cholesterol levels. These may be checked every 5 years, or more frequently if you are over 53 years old.  Skin check.  Lung cancer screening. You may have this screening every year starting at age 70 if you have a 30-pack-year history of smoking and currently smoke or have quit within the past 15 years.  Fecal occult blood test (FOBT) of the stool. You may have this test every year starting at age 66.  Flexible sigmoidoscopy or colonoscopy. You may have a sigmoidoscopy every 5 years or a colonoscopy every 10 years starting at age 66.  Hepatitis C blood test.  Hepatitis B blood test.  Sexually transmitted disease (STD) testing.  Diabetes screening. This is done by checking your blood sugar (glucose) after you have not eaten for a while (fasting). You may have this done every 1-3 years.  Bone density scan. This is done to screen for osteoporosis. You may have this done starting at age 110.  Mammogram. This may be done every 1-2 years. Talk to your health care provider  about how often you should have regular mammograms. Talk with your health care provider about your test results, treatment options, and if necessary, the need for more tests. Vaccines  Your health care provider may recommend certain vaccines, such as:  Influenza vaccine. This is recommended every year.  Tetanus,  diphtheria, and acellular pertussis (Tdap, Td) vaccine. You may need a Td booster every 10 years.  Zoster vaccine. You may need this after age 28.  Pneumococcal 13-valent conjugate (PCV13) vaccine. One dose is recommended after age 36.  Pneumococcal polysaccharide (PPSV23) vaccine. One dose is recommended after age 77. Talk to your health care provider about which screenings and vaccines you need and how often you need them. This information is not intended to replace advice given to you by your health care provider. Make sure you discuss any questions you have with your health care provider. Document Released: 09/11/2015 Document Revised: 05/04/2016 Document Reviewed: 06/16/2015 Elsevier Interactive Patient Education  2017 [place] Prevention in the Home Falls can cause injuries. They can happen to people of all ages. There are many things you can do to make your home safe and to help prevent falls. What can I do on the outside of my home?  Regularly fix the edges of walkways and driveways and fix any cracks.  Remove anything that might make you trip as you walk through a door, such as a raised step or threshold.  Trim any bushes or trees on the path to your home.  Use bright outdoor lighting.  Clear any walking paths of anything that might make someone trip, such as rocks or tools.  Regularly check to see if handrails are loose or broken. Make sure that both sides of any steps have handrails.  Any raised decks and porches should have guardrails on the edges.  Have any leaves, snow, or ice cleared regularly.  Use sand or salt on walking paths during winter.  Clean up any spills in your garage right away. This includes oil or grease spills. What can I do in the bathroom?  Use night lights.  Install grab bars by the toilet and in the tub and shower. Do not use towel bars as grab bars.  Use non-skid mats or decals in the tub or shower.  If you need to sit down in  the shower, use a plastic, non-slip stool.  Keep the floor dry. Clean up any water that spills on the floor as soon as it happens.  Remove soap buildup in the tub or shower regularly.  Attach bath mats securely with double-sided non-slip rug tape.  Do not have throw rugs and other things on the floor that can make you trip. What can I do in the bedroom?  Use night lights.  Make sure that you have a light by your bed that is easy to reach.  Do not use any sheets or blankets that are too big for your bed. They should not hang down onto the floor.  Have a firm chair that has side arms. You can use this for support while you get dressed.  Do not have throw rugs and other things on the floor that can make you trip. What can I do in the kitchen?  Clean up any spills right away.  Avoid walking on wet floors.  Keep items that you use a lot in easy-to-reach places.  If you need to reach something above you, use a strong step stool that has a grab bar.  Keep electrical cords out of the way.  Do not use floor polish or wax that makes floors slippery. If you must use wax, use non-skid floor wax.  Do not have throw rugs and other things on the floor that can make you trip. What can I do with my stairs?  Do not leave any items on the stairs.  Make sure that there are handrails on both sides of the stairs and use them. Fix handrails that are broken or loose. Make sure that handrails are as long as the stairways.  Check any carpeting to make sure that it is firmly attached to the stairs. Fix any carpet that is loose or worn.  Avoid having throw rugs at the top or bottom of the stairs. If you do have throw rugs, attach them to the floor with carpet tape.  Make sure that you have a light switch at the top of the stairs and the bottom of the stairs. If you do not have them, ask someone to add them for you. What else can I do to help prevent falls?  Wear shoes that:  Do not have high  heels.  Have rubber bottoms.  Are comfortable and fit you well.  Are closed at the toe. Do not wear sandals.  If you use a stepladder:  Make sure that it is fully opened. Do not climb a closed stepladder.  Make sure that both sides of the stepladder are locked into place.  Ask someone to hold it for you, if possible.  Clearly mark and make sure that you can see:  Any grab bars or handrails.  First and last steps.  Where the edge of each step is.  Use tools that help you move around (mobility aids) if they are needed. These include:  Canes.  Walkers.  Scooters.  Crutches.  Turn on the lights when you go into a dark area. Replace any light bulbs as soon as they burn out.  Set up your furniture so you have a clear path. Avoid moving your furniture around.  If any of your floors are uneven, fix them.  If there are any pets around you, be aware of where they are.  Review your medicines with your doctor. Some medicines can make you feel dizzy. This can increase your chance of falling. Ask your doctor what other things that you can do to help prevent falls. This information is not intended to replace advice given to you by your health care provider. Make sure you discuss any questions you have with your health care provider. Document Released: 06/11/2009 Document Revised: 01/21/2016 Document Reviewed: 09/19/2014 Elsevier Interactive Patient Education  2017 [employer].

## 2017-05-05 NOTE — Progress Notes (Signed)
Subjective:   Amanda Curry is a 66 y.o. female who presents for an Initial Medicare Annual Wellness Visit.  Review of Systems    N/A  Cardiac Risk Factors include: advanced age (>55men, >56 women);hypertension;dyslipidemia     Objective:    Today's Vitals   05/05/17 0913  BP: 131/88  Pulse: 97  Temp: 97.9 F (36.6 C)  TempSrc: Oral  Weight: 133 lb 8 oz (60.6 kg)  Height: 5\' 2"  (1.575 m)   Body mass index is 24.42 kg/m.   Current Medications (verified) Outpatient Encounter Prescriptions as of 05/05/2017  Medication Sig  . amLODipine (NORVASC) 10 MG tablet Take 0.5 tablets (5 mg total) by mouth daily.  . clotrimazole-betamethasone (LOTRISONE) cream Apply 1 application topically 2 (two) times daily. As needed for itching from premarin side effect  . glucosamine-chondroitin 500-400 MG tablet Take 1 tablet by mouth 3 (three) times daily.  Marland Kitchen lisinopril (PRINIVIL,ZESTRIL) 40 MG tablet Take 1 tablet (40 mg total) by mouth daily.  Marland Kitchen loperamide (IMODIUM A-D) 2 MG tablet Take 2 mg by mouth 4 (four) times daily as needed for diarrhea or loose stools.  . Melatonin 3 MG TABS Take 1 tablet by mouth at bedtime as needed (sleep).  . Multiple Vitamin (MULTIVITAMIN WITH MINERALS) TABS Take 1 tablet by mouth daily.  . Pitavastatin Calcium (LIVALO) 2 MG TABS Take 0.5 tablets (1 mg total) by mouth daily.  Marland Kitchen spironolactone (ALDACTONE) 25 MG tablet TAKE 1 TABLET ONCE DAILY.   No facility-administered encounter medications on file as of 05/05/2017.     Allergies (verified) Hctz [hydrochlorothiazide]   History: Past Medical History:  Diagnosis Date  . Arthritis   . Diverticulosis   . Ganglion of left wrist    thumb joint  . Hyperlipidemia   . Hypertension   . Tinnitus    Past Surgical History:  Procedure Laterality Date  . TONSILLECTOMY    . TUBAL LIGATION     Family History  Problem Relation Age of Onset  . Colon cancer Maternal Grandmother   . Prostate cancer Father   .  Alzheimer's disease Mother   . Hypertension Sister    Social History   Occupational History  . 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 Smoking status: Never Smoker  . Smokeless tobacco: Never Used  . Alcohol use Yes     Comment: rarely  . Drug use: No  . Sexual activity: Yes    Tobacco Counseling Counseling given: Not Answered   Activities of Daily Living In your present state of health, do you have any difficulty performing the following activities: 05/05/2017  Hearing? N  Vision? N  Difficulty concentrating or making decisions? N  Walking or climbing stairs? N  Dressing or bathing? N  Doing errands, shopping? N  Preparing Food and eating ? N  Using the Toilet? N  In the past six months, have you accidently leaked urine? N  Do you have problems with loss of bowel control? N  Managing your Medications? N  Managing your Finances? N  Housekeeping or managing your Housekeeping? N  Some recent data might be hidden    Immunizations and Health Maintenance Immunization History  Administered Date(s) Administered  . Influenza,inj,Quad PF,6+ Mos 06/19/2013  . Influenza-Unspecified 06/15/2015, 06/15/2016  . Pneumococcal Conjugate-13 09/08/2015  . Pneumococcal Polysaccharide-23 11/02/2013  . Zoster 06/28/2016   There are no preventive care reminders to display for this patient.  Patient Care Team:  Shawnee Knapp, MD as PCP - General (Family Medicine) [name], MD as Consulting Physician (Obstetrics and Gynecology) Pedro Earls, MD as Attending Physician (Family Medicine)  Indicate any recent Medical Services you may have received from other than [facility] providers in the past year (date may be approximate).     Assessment:   This is a routine wellness examination for [name].   Hearing/Vision screen Vision Screening Comments: Patient sees Dr. Cathlean Cower for her regular eye exams. She sees him yearly.   Dietary issues and exercise  activities discussed: Current Exercise Habits: The patient does not participate in regular exercise at present (Stays very active working around the home), Exercise limited by: None identified  Goals    . Exercise 3x per week (30 min per time)          Patient will try to exercise at least 3 times a week for 30 minutes on her treadmill.      Depression Screen PHQ 2/9 Scores 05/05/2017 12/15/2016 12/02/2016 11/05/2016 08/05/2016 08/03/2016 05/24/2016  PHQ - 2 Score 0 0 0 0 0 0 0    Fall Risk Fall Risk  05/05/2017 12/15/2016 12/02/2016 11/05/2016 08/05/2016  Falls in the past year? No No No No No    Cognitive Function:     6CIT Screen 05/05/2017  What Year? 0 points  What month? 0 points  What time? 0 points  Count back from 20 0 points  Months in reverse 0 points  Repeat phrase 0 points  Total Score 0    Screening Tests Health Maintenance  Topic Date Due  . INFLUENZA VACCINE  07/05/2017 (Originally 03/29/2017)  . TETANUS/TDAP  05/05/2018 (Originally 10/17/1969)  . Hepatitis C Screening  05/06/2027 (Originally 1951/06/19)  . MAMMOGRAM  08/03/2018  . COLONOSCOPY  10/22/2019  . DEXA SCAN  Completed  . PNA vac Low Risk Adult  Completed      Plan:   I have personally reviewed and noted the following in the patient's chart:   . Medical and social history . Use of alcohol, tobacco or illicit drugs  . Current medications and supplements . Functional ability and status . Nutritional status . Physical activity . Advanced directives . List of other physicians . Hospitalizations, surgeries, and ER visits in previous 12 months . Vitals . Screenings to include cognitive, depression, and falls . Referrals and appointments  In addition, I have reviewed and discussed with patient certain preventive protocols, quality metrics, and best practice recommendations. A written personalized care plan for preventive services as well as general preventive health recommendations were provided to patient.      Andrez Grime, LPN   0/12/3974

## 2017-06-01 DIAGNOSIS — M1711 Unilateral primary osteoarthritis, right knee: Secondary | ICD-10-CM | POA: Diagnosis not present

## 2017-06-01 DIAGNOSIS — M25561 Pain in right knee: Secondary | ICD-10-CM | POA: Diagnosis not present

## 2017-06-01 DIAGNOSIS — G8929 Other chronic pain: Secondary | ICD-10-CM | POA: Diagnosis not present

## 2017-08-31 DIAGNOSIS — L309 Dermatitis, unspecified: Secondary | ICD-10-CM | POA: Diagnosis not present

## 2017-08-31 DIAGNOSIS — Z85828 Personal history of other malignant neoplasm of skin: Secondary | ICD-10-CM | POA: Diagnosis not present

## 2017-08-31 DIAGNOSIS — L814 Other melanin hyperpigmentation: Secondary | ICD-10-CM | POA: Diagnosis not present

## 2017-08-31 DIAGNOSIS — D225 Melanocytic nevi of trunk: Secondary | ICD-10-CM | POA: Diagnosis not present

## 2017-08-31 DIAGNOSIS — D2271 Melanocytic nevi of right lower limb, including hip: Secondary | ICD-10-CM | POA: Diagnosis not present

## 2017-08-31 DIAGNOSIS — L821 Other seborrheic keratosis: Secondary | ICD-10-CM | POA: Diagnosis not present

## 2017-08-31 DIAGNOSIS — D1800 Hemangioma unspecified site: Secondary | ICD-10-CM | POA: Diagnosis not present

## 2017-08-31 DIAGNOSIS — Z23 Encounter for immunization: Secondary | ICD-10-CM | POA: Diagnosis not present

## 2017-09-09 ENCOUNTER — Other Ambulatory Visit: Payer: Self-pay

## 2017-09-09 ENCOUNTER — Ambulatory Visit (INDEPENDENT_AMBULATORY_CARE_PROVIDER_SITE_OTHER): Payer: PPO | Admitting: Family Medicine

## 2017-09-09 ENCOUNTER — Encounter: Payer: Self-pay | Admitting: Family Medicine

## 2017-09-09 VITALS — BP 128/82 | HR 105 | Temp 99.2°F | Resp 16 | Ht 61.5 in | Wt 133.6 lb

## 2017-09-09 DIAGNOSIS — Z5181 Encounter for therapeutic drug level monitoring: Secondary | ICD-10-CM | POA: Diagnosis not present

## 2017-09-09 DIAGNOSIS — I10 Essential (primary) hypertension: Secondary | ICD-10-CM

## 2017-09-09 MED ORDER — SPIRONOLACTONE 50 MG PO TABS: 50.0000 mg | ORAL_TABLET | Freq: Every day | ORAL | 1 refills | Status: DC

## 2017-09-09 MED ORDER — OLMESARTAN MEDOXOMIL 40 MG PO TABS: 40.0000 mg | ORAL_TABLET | Freq: Every day | ORAL | 1 refills | Status: DC

## 2017-09-09 NOTE — Patient Instructions (Signed)
STOP THE AMLODIPINE (NORVASC) AND THE LISINOPRIL. TAKE 2 TABS A DAY OF THE SPIRONOLACTONE UNTIL YOUR CURRENT BOTTLE IS GONE - THEN START ONE A DAY ON THE NEW BOTTLE OF SPIRONOLACTONE. START OLMESARTAN (THIS IS GOING TO TAKE THE PLACE OF THE LISINOPRIL). CONTINUE THE LIVALO (PITIVASTATIN - CHOLESTEROL MEDICINE)   Managing Your Hypertension Hypertension is commonly called high blood pressure. This is when the force of your blood pressing against the walls of your arteries is too strong. Arteries are blood vessels that carry blood from your heart throughout your body. Hypertension forces the heart to work harder to pump blood, and may cause the arteries to become narrow or stiff. Having untreated or uncontrolled hypertension can cause heart attack, stroke, kidney disease, and other problems. What are blood pressure readings? A blood pressure reading consists of a higher number over a lower number. Ideally, your blood pressure should be below 120/80. The first ("top") number is called the systolic pressure. It is a measure of the pressure in your arteries as your heart beats. The second ("bottom") number is called the diastolic pressure. It is a measure of the pressure in your arteries as the heart relaxes. What does my blood pressure reading mean? Blood pressure is classified into four stages. Based on your blood pressure reading, your health care provider may use the following stages to determine what type of treatment you need, if any. Systolic pressure and diastolic pressure are measured in a unit called mm Hg. Normal  Systolic pressure: below 779.  Diastolic pressure: below 80. Elevated  Systolic pressure: 390-300.  Diastolic pressure: below 80. Hypertension stage 1  Systolic pressure: 923-300.  Diastolic pressure: 76-22. Hypertension stage 2  Systolic pressure: 633 or above.  Diastolic pressure: 90 or above. What health risks are associated with hypertension? Managing your  hypertension is an important responsibility. Uncontrolled hypertension can lead to:  A heart attack.  A stroke.  A weakened blood vessel (aneurysm).  Heart failure.  Kidney damage.  Eye damage.  Metabolic syndrome.  Memory and concentration problems.  What changes can I make to manage my hypertension? Hypertension can be managed by making lifestyle changes and possibly by taking medicines. Your health care provider will help you make a plan to bring your blood pressure within a normal range. Eating and drinking  Eat a diet that is high in fiber and potassium, and low in salt (sodium), added sugar, and fat. An example eating plan is called the DASH (Dietary Approaches to Stop Hypertension) diet. To eat this way: ? Eat plenty of fresh fruits and vegetables. Try to fill half of your plate at each meal with fruits and vegetables. ? Eat whole grains, such as whole wheat pasta, brown rice, or whole grain bread. Fill about one quarter of your plate with whole grains. ? Eat low-fat diary products. ? Avoid fatty cuts of meat, processed or cured meats, and poultry with skin. Fill about one quarter of your plate with lean proteins such as fish, chicken without skin, beans, eggs, and tofu. ? Avoid premade and processed foods. These tend to be higher in sodium, added sugar, and fat.  Reduce your daily sodium intake. Most people with hypertension should eat less than 1,500 mg of sodium a day.  Limit alcohol intake to no more than 1 drink a day for nonpregnant women and 2 drinks a day for men. One drink equals 12 oz of beer, 5 oz of wine, or 1 oz of hard liquor. Lifestyle  Work with  your health care provider to maintain a healthy body weight, or to lose weight. Ask what an ideal weight is for you.  Get at least 30 minutes of exercise that causes your heart to beat faster (aerobic exercise) most days of the week. Activities may include walking, swimming, or biking.  Include exercise to  strengthen your muscles (resistance exercise), such as weight lifting, as part of your weekly exercise routine. Try to do these types of exercises for 30 minutes at least 3 days a week.  Do not use any products that contain nicotine or tobacco, such as cigarettes and e-cigarettes. If you need help quitting, ask your health care provider.  Control any long-term (chronic) conditions you have, such as high cholesterol or diabetes. Monitoring  Monitor your blood pressure at home as told by your health care provider. Your personal target blood pressure may vary depending on your medical conditions, your age, and other factors.  Have your blood pressure checked regularly, as often as told by your health care provider. Working with your health care provider  Review all the medicines you take with your health care provider because there may be side effects or interactions.  Talk with your health care provider about your diet, exercise habits, and other lifestyle factors that may be contributing to hypertension.  Visit your health care provider regularly. Your health care provider can help you create and adjust your plan for managing hypertension. Will I need medicine to control my blood pressure? Your health care provider may prescribe medicine if lifestyle changes are not enough to get your blood pressure under control, and if:  Your systolic blood pressure is 130 or higher.  Your diastolic blood pressure is 80 or higher.  Take medicines only as told by your health care provider. Follow the directions carefully. Blood pressure medicines must be taken as prescribed. The medicine does not work as well when you skip doses. Skipping doses also puts you at risk for problems. Contact a health care provider if:  You think you are having a reaction to medicines you have taken.  You have repeated (recurrent) headaches.  You feel dizzy.  You have swelling in your ankles.  You have trouble with your  vision. Get help right away if:  You develop a severe headache or confusion.  You have unusual weakness or numbness, or you feel faint.  You have severe pain in your chest or abdomen.  You vomit repeatedly.  You have trouble breathing. Summary  Hypertension is when the force of blood pumping through your arteries is too strong. If this condition is not controlled, it may put you at risk for serious complications.  Your personal target blood pressure may vary depending on your medical conditions, your age, and other factors. For most people, a normal blood pressure is less than 120/80.  Hypertension is managed by lifestyle changes, medicines, or both. Lifestyle changes include weight loss, eating a healthy, low-sodium diet, exercising more, and limiting alcohol. This information is not intended to replace advice given to you by your health care provider. Make sure you discuss any questions you have with your health care provider. Document Released: 05/09/2012 Document Revised: 07/13/2016 Document Reviewed: 07/13/2016 Elsevier Interactive Patient Education  [employer].

## 2017-09-09 NOTE — Progress Notes (Signed)
Subjective:  By signing my name below, I, Amanda Curry, attest that this documentation has been prepared under the direction and in the presence of [name], MD. Electronically Signed: Moises Curry, [facility]. 09/09/2017 , 3:45 PM .  Patient was seen in Room 2 .   Patient ID: Amanda Curry, female    DOB: [birth year], 67 y.o.   MRN: [id number] Chief Complaint  Patient presents with  . Medication Refill    Lisinopril 40 mg,   . swelling    in ankles and "can't get ring off, w/out soap and water   HPI Amanda Curry is a 67 y.o. female who presents to Primary Care at [facility] for medication refill. She isn't fasting today, had lunch prior to office visit today.   HTN She hasn't been checking her BP at home. She currently takes norvasc 10mg  half tablet daily (5mg  QD), Lisinopril 40mg  QD, and spironolactone 25mg  QD.   She noticed some swelling in her ankles and fingers, to the point where she's unable to take her ring off without running soap and cold water.   Past Medical History:  Diagnosis Date  . Arthritis   . Diverticulosis   . Ganglion of left wrist    thumb joint  . Hyperlipidemia   . Hypertension   . Tinnitus    Past Surgical History:  Procedure Laterality Date  . TONSILLECTOMY    . TUBAL LIGATION     Prior to Admission medications   Medication Sig Start Date End Date Taking? Authorizing Provider  amLODipine (NORVASC) 10 MG tablet Take 0.5 tablets (5 mg total) by mouth daily. 12/02/16   Shawnee Knapp, MD  clotrimazole-betamethasone (LOTRISONE) cream Apply 1 application topically 2 (two) times daily. As needed for itching from premarin side effect 12/02/16   Shawnee Knapp, MD  glucosamine-chondroitin 500-400 MG tablet Take 1 tablet by mouth 3 (three) times daily.    [provider]  lisinopril (PRINIVIL,ZESTRIL) 40 MG tablet Take 1 tablet (40 mg total) by mouth daily. 03/09/17   Shawnee Knapp, MD  loperamide (IMODIUM A-D) 2 MG tablet Take 2 mg by mouth 4 (four) times daily  as needed for diarrhea or loose stools.    [provider]  Melatonin 3 MG TABS Take 1 tablet by mouth at bedtime as needed (sleep).    [provider]  Multiple Vitamin (MULTIVITAMIN WITH MINERALS) TABS Take 1 tablet by mouth daily.    [provider]  Pitavastatin Calcium (LIVALO) 2 MG TABS Take 0.5 tablets (1 mg total) by mouth daily. 12/03/16   Shawnee Knapp, MD  spironolactone (ALDACTONE) 25 MG tablet TAKE 1 TABLET ONCE DAILY. 12/20/16   Shawnee Knapp, MD   Allergies  Allergen Reactions  . Hctz [Hydrochlorothiazide] Other (See Comments)    Causes extreme hyponatremia quickly - 126   Family History  Problem Relation Age of Onset  . Colon cancer Maternal Grandmother   . Prostate cancer Father   . Alzheimer's disease Mother   . Hypertension Sister    Social History   Socioeconomic History  . Marital status: Married    Spouse name: [name]  . Number of children: 4  . Years of education: None  . Highest education level: None  Social Needs  . Financial resource strain: None  . Food insecurity - worry: None  . Food insecurity - inability: None  . Transportation needs - medical: None  . Transportation needs - non-medical: None  Occupational History  .  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 Smoking status: Never Smoker  . Smokeless tobacco: Never Used  Substance and Sexual Activity  . Alcohol use: Yes    Comment: rarely  . Drug use: No  . Sexual activity: Yes  Other Topics Concern  . None  Social History Narrative   Lives with her husband.   Adult children. Two live in [place].   Depression screen [facility] 2/9 09/09/2017 05/05/2017 12/15/2016 12/02/2016 11/05/2016  Decreased Interest 0 0 0 0 0  Down, Depressed, Hopeless 0 0 0 0 0  PHQ - 2 Score 0 0 0 0 0    Review of Systems  Constitutional: Negative for fatigue and unexpected weight change.  Respiratory: Negative for chest tightness and shortness of breath.     Cardiovascular: Negative for chest pain, palpitations and leg swelling.  Gastrointestinal: Negative for abdominal pain and Curry in stool.  Neurological: Negative for dizziness, syncope, light-headedness and headaches.       Objective:   Physical Exam  Constitutional: She is oriented to person, place, and time. She appears well-developed and well-nourished. No distress.  HENT:  Head: Normocephalic and atraumatic.  Eyes: EOM are normal. Pupils are equal, round, and reactive to light.  Neck: Neck supple.  Cardiovascular: Normal rate, regular rhythm and normal heart sounds.  No murmur heard. Pulses:      Dorsalis pedis pulses are 2+ on the right side, and 2+ on the left side.  Pulmonary/Chest: Effort normal and breath sounds normal. No respiratory distress.  Musculoskeletal: Normal range of motion.  Neurological: She is alert and oriented to person, place, and time.  Skin: Skin is warm and dry.  Psychiatric: She has a normal mood and affect. Her behavior is normal.  Nursing note and vitals reviewed.   BP 128/82   Pulse (!) 105   Temp 99.2 F (37.3 C) (Oral)   Resp 16   Ht 5' 1.5" (1.562 m)   Wt 133 lb 9.6 oz (60.6 kg)   SpO2 98%   BMI 24.83 kg/m      Assessment & Plan:   1. Essential hypertension   2. Medication monitoring encounter    Complains of some very slight edema.  Review of chart that last year patient had significant edema which saw significant improvement when amlodipine was decreased from 10 to 5 mg.  Therefore will try stopping amlodipine.   Increase effectiveness of acei as on lisinopril 40 by changing to olmesartan 40.   Increase spironolactone from 25 to 50.  Patient agrees to fasting labs at recheck OV in 1-3 mos.   Meds ordered this encounter  Medications  . olmesartan (BENICAR) 40 MG tablet    Sig: Take 1 tablet (40 mg total) by mouth daily.    Dispense:  90 tablet    Refill:  1  . spironolactone (ALDACTONE) 50 MG tablet    Sig: Take 1 tablet  (50 mg total) by mouth daily.    Dispense:  90 tablet    Refill:  1    I personally performed the services described in this documentation, which was scribed in my presence. The recorded information has been reviewed and considered, and addended by me as needed.   [name], M.D.  Primary Care at [facility] [address] [place], [place] [address] [phone] phone [phone] fax  09/12/17 12:38 PM

## 2017-10-12 ENCOUNTER — Telehealth: Payer: Self-pay | Admitting: Family Medicine

## 2017-10-12 NOTE — Telephone Encounter (Signed)
Copied from [place] [id number]. Topic: Inquiry >> Oct 12, 2017  4:53 PM Neva Seat wrote: Pt was told to come back for labs in a month.  Pt called back, but there are no orders for the labs. Please call pt to schedule labs once orders have been placed.  She also wants to know about fasting for labs.

## 2017-10-13 NOTE — Telephone Encounter (Signed)
Message to Dr. Brigitte Pulse. Re: lab orders Pt instructed to return for fasting labs. No orders in chart.

## 2017-10-15 NOTE — Telephone Encounter (Signed)
Sorry - I had that that we would do labs DURING an office visit - and yes, fasting. My last note: "Patient agrees to fasting labs at recheck OV in 1-3 mos." If for some reason, she can't come in for a visit, please ask her what her BP has been running about at home and I will put in orders for a lab only visit

## 2017-12-04 ENCOUNTER — Telehealth: Payer: Self-pay | Admitting: Family Medicine

## 2017-12-04 DIAGNOSIS — I10 Essential (primary) hypertension: Secondary | ICD-10-CM

## 2017-12-04 DIAGNOSIS — Z130 Encounter for screening for diseases of the blood and blood-forming organs and certain disorders involving the immune mechanism: Secondary | ICD-10-CM

## 2017-12-04 DIAGNOSIS — R7303 Prediabetes: Secondary | ICD-10-CM

## 2017-12-04 DIAGNOSIS — Z1329 Encounter for screening for other suspected endocrine disorder: Secondary | ICD-10-CM

## 2017-12-04 DIAGNOSIS — Z1383 Encounter for screening for respiratory disorder NEC: Principal | ICD-10-CM

## 2017-12-04 DIAGNOSIS — Z136 Encounter for screening for cardiovascular disorders: Secondary | ICD-10-CM

## 2017-12-04 DIAGNOSIS — Z5181 Encounter for therapeutic drug level monitoring: Secondary | ICD-10-CM

## 2017-12-04 DIAGNOSIS — Z1159 Encounter for screening for other viral diseases: Secondary | ICD-10-CM

## 2017-12-04 DIAGNOSIS — Z1389 Encounter for screening for other disorder: Principal | ICD-10-CM

## 2017-12-04 DIAGNOSIS — E7800 Pure hypercholesterolemia, unspecified: Secondary | ICD-10-CM

## 2017-12-04 DIAGNOSIS — Z119 Encounter for screening for infectious and parasitic diseases, unspecified: Secondary | ICD-10-CM

## 2017-12-04 NOTE — Addendum Note (Signed)
Addended by: Delle Reining on: 12/04/2017 01:26 PM   Modules accepted: Orders

## 2017-12-04 NOTE — Telephone Encounter (Signed)
Patient called to advise she will need a yearly physical and lab appointment scheduled, appointment scheduled for Thursday, May 30 at 0820, labs will need to be ordered prior to appointment.

## 2017-12-04 NOTE — Telephone Encounter (Signed)
A1c, lipid panel, CMP, and CBC pended.

## 2017-12-06 ENCOUNTER — Telehealth: Payer: Self-pay | Admitting: Family Medicine

## 2017-12-06 ENCOUNTER — Other Ambulatory Visit: Payer: Self-pay

## 2017-12-06 MED ORDER — PITAVASTATIN CALCIUM 2 MG PO TABS: 1.0000 | ORAL_TABLET | Freq: Every day | ORAL | 0 refills | Status: DC

## 2017-12-06 NOTE — Telephone Encounter (Signed)
I called Dr. Sherran Needs office and left a VM stating we need pt's last mammogram, dexa scan, last ov and last well-woman/screening pe note.

## 2017-12-06 NOTE — Telephone Encounter (Signed)
Called pt to let them know about coming in early for the blood draw for the CPE. She said she would more than likely come in on Friday, I advised about fasting. She did have a question about one of her medications. I transferred her to [facility] and she was able to help her.

## 2017-12-06 NOTE — Telephone Encounter (Signed)
Please refer to call from 12/04/17. I got pt's info Dr. Brigitte Pulse was wanting. I will place in her box 102. There looks like there may be an issue concerning a med refill.

## 2017-12-06 NOTE — Addendum Note (Signed)
Addended by: Shawnee Knapp on: 12/06/2017 12:09 AM   Modules accepted: Orders

## 2017-12-06 NOTE — Telephone Encounter (Addendum)
Labs ordered. Please call pt and let her know that I recommend she come in to have her blood drawn in a lab-only visit ~3-5 days prior to scheduled complete physical exam on 01/25/18 so that the results are available for [place] to review together and discuss any implications during visit.  DO fast for blood draw (nothing but water and black coffee/unsweet tea after midnight the evening prior.) but then does not need to fast on day of CPE - make sure to take regular meds on the regular sched on day of CPE.  Can we please call Dr. Ophelia Charter office - pt's gynecologist - and ask that they fax [place] a copy of her last mammogram, dexa scan, last OV note, and the last well-woman/screening physical exam note (if the last OV was problem based that is).  I would like to have this info so I can update her chart at her CPE sched for 5/30. Thanks.

## 2017-12-30 ENCOUNTER — Ambulatory Visit: Payer: PPO

## 2017-12-30 DIAGNOSIS — R7303 Prediabetes: Secondary | ICD-10-CM

## 2017-12-30 DIAGNOSIS — Z5181 Encounter for therapeutic drug level monitoring: Secondary | ICD-10-CM | POA: Diagnosis not present

## 2017-12-30 DIAGNOSIS — Z136 Encounter for screening for cardiovascular disorders: Secondary | ICD-10-CM

## 2017-12-30 DIAGNOSIS — Z1389 Encounter for screening for other disorder: Secondary | ICD-10-CM

## 2017-12-30 DIAGNOSIS — I10 Essential (primary) hypertension: Secondary | ICD-10-CM

## 2017-12-30 DIAGNOSIS — Z1159 Encounter for screening for other viral diseases: Secondary | ICD-10-CM | POA: Diagnosis not present

## 2017-12-30 DIAGNOSIS — Z1329 Encounter for screening for other suspected endocrine disorder: Secondary | ICD-10-CM

## 2017-12-30 DIAGNOSIS — Z119 Encounter for screening for infectious and parasitic diseases, unspecified: Secondary | ICD-10-CM | POA: Diagnosis not present

## 2017-12-30 DIAGNOSIS — Z1383 Encounter for screening for respiratory disorder NEC: Secondary | ICD-10-CM | POA: Diagnosis not present

## 2017-12-30 DIAGNOSIS — E7800 Pure hypercholesterolemia, unspecified: Secondary | ICD-10-CM | POA: Diagnosis not present

## 2017-12-30 DIAGNOSIS — Z130 Encounter for screening for diseases of the blood and blood-forming organs and certain disorders involving the immune mechanism: Secondary | ICD-10-CM

## 2017-12-31 LAB — HEMOGLOBIN A1C
Est. average glucose Bld gHb Est-mCnc: 120 mg/dL
Hgb A1c MFr Bld: 5.8 % — ABNORMAL HIGH (ref 4.8–5.6)

## 2017-12-31 LAB — CBC WITH DIFFERENTIAL/PLATELET
BASOS: 0 %
Basophils Absolute: 0 10*3/uL (ref 0.0–0.2)
EOS (ABSOLUTE): 0.3 10*3/uL (ref 0.0–0.4)
EOS: 5 %
HEMATOCRIT: 35.7 % (ref 34.0–46.6)
HEMOGLOBIN: 11.7 g/dL (ref 11.1–15.9)
Immature Grans (Abs): 0 10*3/uL (ref 0.0–0.1)
Immature Granulocytes: 0 %
LYMPHS ABS: 1.5 10*3/uL (ref 0.7–3.1)
Lymphs: 26 %
MCH: 30 pg (ref 26.6–33.0)
MCHC: 32.8 g/dL (ref 31.5–35.7)
MCV: 92 fL (ref 79–97)
MONOCYTES: 11 %
Monocytes Absolute: 0.7 10*3/uL (ref 0.1–0.9)
NEUTROS ABS: 3.4 10*3/uL (ref 1.4–7.0)
Neutrophils: 58 %
Platelets: 344 10*3/uL (ref 150–379)
RBC: 3.9 x10E6/uL (ref 3.77–5.28)
RDW: 13.1 % (ref 12.3–15.4)
WBC: 5.9 10*3/uL (ref 3.4–10.8)

## 2017-12-31 LAB — LIPID PANEL
CHOL/HDL RATIO: 3.1 ratio (ref 0.0–4.4)
Cholesterol, Total: 201 mg/dL — ABNORMAL HIGH (ref 100–199)
HDL: 64 mg/dL (ref >39)
LDL CALC: 106 mg/dL — AB (ref 0–99)
Triglycerides: 153 mg/dL — ABNORMAL HIGH (ref 0–149)
VLDL Cholesterol Cal: 31 mg/dL (ref 5–40)

## 2017-12-31 LAB — CMP14+EGFR
A/G RATIO: 1.8 (ref 1.2–2.2)
ALK PHOS: 59 IU/L (ref 39–117)
ALT: 14 IU/L (ref 0–32)
AST: 18 IU/L (ref 0–40)
Albumin: 4.4 g/dL (ref 3.6–4.8)
BUN/Creatinine Ratio: 31 — ABNORMAL HIGH (ref 12–28)
BUN: 29 mg/dL — ABNORMAL HIGH (ref 8–27)
Bilirubin Total: 0.3 mg/dL (ref 0.0–1.2)
CALCIUM: 9.9 mg/dL (ref 8.7–10.3)
CO2: 21 mmol/L (ref 20–29)
CREATININE: 0.95 mg/dL (ref 0.57–1.00)
Chloride: 90 mmol/L — ABNORMAL LOW (ref 96–106)
GFR calc Af Amer: 71 mL/min/{1.73_m2} (ref >59)
GFR, EST NON AFRICAN AMERICAN: 61 mL/min/{1.73_m2} (ref >59)
Globulin, Total: 2.4 g/dL (ref 1.5–4.5)
Glucose: 105 mg/dL — ABNORMAL HIGH (ref 65–99)
POTASSIUM: 5.4 mmol/L — AB (ref 3.5–5.2)
Sodium: 124 mmol/L — ABNORMAL LOW (ref 134–144)
Total Protein: 6.8 g/dL (ref 6.0–8.5)

## 2017-12-31 LAB — HCV AB W/RFLX TO VERIFICATION: HCV Ab: <0.1 s/co ratio (ref 0.0–0.9)

## 2017-12-31 LAB — HCV INTERPRETATION

## 2017-12-31 LAB — TSH: TSH: 9 u[IU]/mL — ABNORMAL HIGH (ref 0.450–4.500)

## 2018-01-25 ENCOUNTER — Ambulatory Visit (INDEPENDENT_AMBULATORY_CARE_PROVIDER_SITE_OTHER): Payer: PPO | Admitting: Family Medicine

## 2018-01-25 ENCOUNTER — Encounter: Payer: Self-pay | Admitting: Family Medicine

## 2018-01-25 ENCOUNTER — Ambulatory Visit (INDEPENDENT_AMBULATORY_CARE_PROVIDER_SITE_OTHER): Payer: PPO

## 2018-01-25 ENCOUNTER — Other Ambulatory Visit: Payer: Self-pay

## 2018-01-25 VITALS — BP 114/76 | HR 86 | Temp 98.4°F | Resp 18 | Ht 61.5 in | Wt 130.4 lb

## 2018-01-25 DIAGNOSIS — R9431 Abnormal electrocardiogram [ECG] [EKG]: Secondary | ICD-10-CM

## 2018-01-25 DIAGNOSIS — N8110 Cystocele, unspecified: Secondary | ICD-10-CM

## 2018-01-25 DIAGNOSIS — Z0000 Encounter for general adult medical examination without abnormal findings: Secondary | ICD-10-CM

## 2018-01-25 DIAGNOSIS — R05 Cough: Secondary | ICD-10-CM | POA: Diagnosis not present

## 2018-01-25 DIAGNOSIS — R7989 Other specified abnormal findings of blood chemistry: Secondary | ICD-10-CM

## 2018-01-25 DIAGNOSIS — Z1383 Encounter for screening for respiratory disorder NEC: Secondary | ICD-10-CM

## 2018-01-25 DIAGNOSIS — Z5181 Encounter for therapeutic drug level monitoring: Secondary | ICD-10-CM | POA: Diagnosis not present

## 2018-01-25 DIAGNOSIS — E7800 Pure hypercholesterolemia, unspecified: Secondary | ICD-10-CM | POA: Diagnosis not present

## 2018-01-25 DIAGNOSIS — R3121 Asymptomatic microscopic hematuria: Secondary | ICD-10-CM | POA: Diagnosis not present

## 2018-01-25 DIAGNOSIS — Z136 Encounter for screening for cardiovascular disorders: Secondary | ICD-10-CM

## 2018-01-25 DIAGNOSIS — R7303 Prediabetes: Secondary | ICD-10-CM | POA: Diagnosis not present

## 2018-01-25 DIAGNOSIS — Z1389 Encounter for screening for other disorder: Secondary | ICD-10-CM | POA: Diagnosis not present

## 2018-01-25 DIAGNOSIS — I10 Essential (primary) hypertension: Secondary | ICD-10-CM | POA: Diagnosis not present

## 2018-01-25 LAB — POCT URINALYSIS DIP (MANUAL ENTRY)
Bilirubin, UA: NEGATIVE
Glucose, UA: NEGATIVE mg/dL
Ketones, POC UA: NEGATIVE mg/dL
Leukocytes, UA: NEGATIVE
NITRITE UA: NEGATIVE
PROTEIN UA: NEGATIVE mg/dL
Spec Grav, UA: 1.015 (ref 1.010–1.025)
UROBILINOGEN UA: 0.2 U/dL
pH, UA: 6 (ref 5.0–8.0)

## 2018-01-25 NOTE — Progress Notes (Addendum)
Subjective:  By signing my name below, I, Amanda Curry, attest that this documentation has been prepared under the direction and in the presence of Amanda Cheadle, MD. Electronically Signed: Moises Curry, [place]. 01/25/2018 , 9:39 AM .  Patient was seen in Room 2 .   Patient ID: Amanda Curry, female    DOB: 06/19/1951, 67 y.o.   MRN: [id number] Chief Complaint  Patient presents with  . Annual Exam   HPI  Last seen 4 months prior for eval of HTN.   Primary Preventative Screenings: Cervical Cancer:  Breast Cancer: last done at Dr. Sherran Curry.  Colorectal Cancer: Tobacco use/EtOH/substances:  Bone Density: last done at Dr. Sherran Curry.  Cardiac: She had prior EKG's done at another office.  Weight/Curry sugar/Diet/Exercise: she's been exercising through yard work. She's signed up silver sneakers.  BMI Readings from Last 3 Encounters:  01/25/18 24.24 kg/m  09/09/17 24.83 kg/m  05/05/17 24.42 kg/m   Lab Results  Component Value Date   HGBA1C 5.8 (H) 12/30/2017   OTC/Vit/Supp/Herbal: She still takes melatonin and multivitamin.  Dentist/Optho: She sees optho and dentist over the past year.  Immunizations:  Immunization History  Administered Date(s) Administered  . Influenza,inj,Quad PF,6+ Mos 06/19/2013  . Influenza-Unspecified 06/15/2015, 06/15/2016, 07/04/2017  . Pneumococcal Conjugate-13 09/08/2015  . Pneumococcal Polysaccharide-23 11/02/2013  . Zoster 06/28/2016    Chronic Medical Conditions: HTN: She was seen 4 months ago, and was taking amlodipine 5mg , lisinopril 40mg , and spironolactone 25mg , but concern edema was coming from amlodipine which was discontinued so for additional BP control, lisinopril 40mg  changed to olmesartan, and spironolactone changed to 50mg . She's doing well on the new medication regime. She states her irritated cough and indigestion, as well as swelling resolved since changing the regime. She denies family history of COPD. She denies checking her BP  outside of the office.   HLD: She takes Livalo half tablet a day.   She had a pessary ring in place, but it fell out. While it was in, she had yeast infections and had spotting. After it fell it out, she washed it, but didn't put it back in. Plans to discuss with Dr. Radene Knee.   Past Medical History:  Diagnosis Date  . Arthritis   . Diverticulosis   . Ganglion of left wrist    thumb joint  . Hyperlipidemia   . Hypertension   . Tinnitus    Past Surgical History:  Procedure Laterality Date  . TONSILLECTOMY    . TUBAL LIGATION     Current Outpatient Medications on File Prior to Visit  Medication Sig Dispense Refill  . glucosamine-chondroitin 500-400 MG tablet Take 1 tablet by mouth 3 (three) times daily.    Marland Kitchen loperamide (IMODIUM A-D) 2 MG tablet Take 2 mg by mouth 4 (four) times daily as needed for diarrhea or loose stools.    . Melatonin 3 MG TABS Take 1 tablet by mouth at bedtime as needed (sleep).    . Multiple Vitamin (MULTIVITAMIN WITH MINERALS) TABS Take 1 tablet by mouth daily.    Marland Kitchen olmesartan (BENICAR) 40 MG tablet Take 1 tablet (40 mg total) by mouth daily. 90 tablet 1  . Pitavastatin Calcium (LIVALO) 2 MG TABS Take 1 tablet (2 mg total) by mouth daily. 30 tablet 0  . spironolactone (ALDACTONE) 50 MG tablet Take 1 tablet (50 mg total) by mouth daily. 90 tablet 1  . clotrimazole-betamethasone (LOTRISONE) cream Apply 1 application topically 2 (two) times daily. As needed for itching from premarin side  effect (Patient not taking: Reported on 01/25/2018) 45 g 0   No current facility-administered medications on file prior to visit.    Allergies  Allergen Reactions  . Hctz [Hydrochlorothiazide] Other (See Comments)    Causes extreme hyponatremia quickly - 126   Family History  Problem Relation Age of Onset  . Colon cancer Maternal Grandmother   . Prostate cancer Father   . Alzheimer's disease Mother   . Hypertension Sister    Social History   Socioeconomic History  .  Marital status: Married    Spouse name: Amanda Curry  . Number of children: 4  . Years of education: Not on file  . Highest education level: Not on file  Occupational History  . Occupation: Pharmacist, hospital    Comment: Corporate treasurer  Social Curry  . Financial resource strain: Not on file  . Food insecurity:    Worry: Not on file    Inability: Not on file  . Transportation Curry:    Medical: Not on file    Non-medical: Not on file  Tobacco Use  . Smoking status: Never Smoker  . Smokeless tobacco: Never Used  Substance and Sexual Activity  . Alcohol use: Yes    Comment: rarely  . Drug use: No  . Sexual activity: Yes  Lifestyle  . Physical activity:    Days per week: Not on file    Minutes per session: Not on file  . Stress: Not on file  Relationships  . Social connections:    Talks on phone: Not on file    Gets together: Not on file    Attends religious service: Not on file    Active member of club or organization: Not on file    Attends meetings of clubs or organizations: Not on file    Relationship status: Not on file  Other Topics Concern  . Not on file  Social History Narrative   Lives with her husband.   Adult children. Two live in [place].   Depression screen [facility] 2/9 01/25/2018 09/09/2017 05/05/2017 12/15/2016 12/02/2016  Decreased Interest 0 0 0 0 0  Down, Depressed, Hopeless 0 0 0 0 0  PHQ - 2 Score 0 0 0 0 0   Review of Systems  Constitutional: Negative for chills, fatigue, fever and unexpected weight change.  Respiratory: Negative for cough.   Gastrointestinal: Negative for constipation, diarrhea, nausea and vomiting.  Skin: Negative for rash and wound.  Neurological: Negative for dizziness, weakness and headaches.  All other systems reviewed and are negative. unless otherwise noted in HPI     Objective:   Physical Exam  Constitutional: She is oriented to person, place, and time. She appears well-developed and well-nourished. No distress.  HENT:    Head: Normocephalic and atraumatic.  Mouth/Throat: Oropharynx is clear and moist.  Eyes: Pupils are equal, round, and reactive to light. EOM are normal.  Neck: Neck supple.  Cardiovascular: Normal rate and normal heart sounds.  No murmur heard. Pulmonary/Chest: Effort normal and breath sounds normal. No respiratory distress. She has no decreased breath sounds.  Musculoskeletal: Normal range of motion.  Neurological: She is alert and oriented to person, place, and time.  Skin: Skin is warm and dry.  Psychiatric: She has a normal mood and affect. Her behavior is normal.  Nursing note and vitals reviewed.   BP 114/76 (BP Location: Left Arm, Patient Position: Sitting, Cuff Size: Normal)   Pulse 86   Temp 98.4 F (36.9 C) (Oral)   Resp 18  Ht 5' 1.5" (1.562 m)   Wt 130 lb 6.4 oz (59.1 kg)   SpO2 98%   BMI 24.24 kg/m    Visual Acuity Screening   Right eye Left eye Both eyes  Without correction:     With correction:  20/20 20/20  Comments: Pt can't see with right with distance. Pt wears up close contact.   EKG: NSR, no acute ischemic changes noted. No prior EKG done for comparison.  I have personally reviewed the EKG tracing and agree with the computer interpretation: Sinus Rhythm Low voltage -possible pulmonary disease    Dg Chest 2 View  Result Date: 01/25/2018 CLINICAL DATA:  Cough. EXAM: CHEST - 2 VIEW COMPARISON:  Radiographs of April 02, 2014. FINDINGS: The heart size and mediastinal contours are within normal limits. Both lungs are clear. No pneumothorax or pleural effusion is noted. The visualized skeletal structures are unremarkable. IMPRESSION: No active cardiopulmonary disease. Electronically Signed   By: Marijo Conception, M.D.   On: 01/25/2018 09:53    Assessment & Plan:   1. Annual physical exam - pt had routine screening/CPE labs drawn sev wks ago so we could have results avail to discuss in office today  2. Screening for cardiovascular, respiratory, and  genitourinary diseases   3. Essential hypertension - doing great on new regimen of spironolactone 50 qam and olmesartan 40 - edema from amlodipine 5 resolved.  Had severe symptomatic hyponatriemia on thiazide diuretics but pt was willing to try spironolactone but concerned as Na came back as 124 w/ nml 134-144 and was 134 last yr w/o so recheck today - I suspect this was simply secondary to dehydration from fasting for labs. Also K was slightly elev on last labs so recheck today.  4. Pure hypercholesterolemia -LDL 118->106 and non-HDL 148 ->136 since started and tolerating 1mg  (=1/2 tab) of pitavastatin daily and working on TLC  5. Prediabetes -  Lab Results  Component Value Date   HGBA1C 5.8 (H) 12/30/2017   HGBA1C 5.7 (H) 12/15/2016    6. Abnormal thyroid Curry test -  Lab Results  Component Value Date   TSH 6.070 (H) 01/25/2018   TSH 9.000 (H) 12/30/2017   TSH 3.802 07/22/2015    7. Medication monitoring encounter   8. Nonspecific abnormal electrocardiogram (ECG) (EKG) - low voltage but pt denies any personal hx or family hx of lung/pulm diseases inc COPD or sig personal h/o secondhand smoke/inhalents/particulants exposures. Weight nml w/ small breast size so no obvious physical region to explain low voltrage. No prior ekg avail for comparison so check CXR - fortunately nml and pt denies any chest or pulm sxs so I do not think any further w/u for this finding is necessary at this time but pt will RTC if develops any  9.      Asymptomatic microscopic hematuria - small Curry present on all UAs but highly suspect due to cystocele and vaginal mucosal irritation from pessary which pt reports causing gross bleeding and gyn is aware. 43.    Female cystocele - follows reg w/ gyn Dr. Radene Knee - has had a pessary for years to trx a cystocele which gyn would remove to clean and replace q3 mos but would cause vaginal irritation, yeast infections, and bleeding. Recently fell out and is choosing to leave it  out to see if cystocele recurs and how severe as would really just like to have a laparoscopic vag hysterectomy (LAVH).  Has tried vaginal topicals prior w/o benefit (I mentioned  top estrogen every few days once a month while leaving pessary out might help)   Orders Placed This Encounter  Procedures  . DG Chest 2 View    Standing Status:   Future    Number of Occurrences:   1    Standing Expiration Date:   01/25/2019    Order Specific Question:   Reason for Exam (SYMPTOM  OR DIAGNOSIS REQUIRED)    Answer:   low voltage on EKG noting possible pulmonary disease, no h/o pulm risk factors though did have long-standing cough with acei now releived since changing med 3 mos prior.    Order Specific Question:   Preferred imaging location?    Answer:   External  . Basic metabolic panel    Order Specific Question:   Has the patient fasted?    Answer:   No  . TSH+T4F+T3Free  . POCT urinalysis dipstick  . EKG 12-Lead    Meds ordered this encounter  Medications  . olmesartan (BENICAR) 40 MG tablet    Sig: Take 1 tablet (40 mg total) by mouth daily.    Dispense:  90 tablet    Refill:  3  . Pitavastatin Calcium (LIVALO) 2 MG TABS    Sig: Take 0.5 tablets (1 mg total) by mouth daily.    Dispense:  45 tablet    Refill:  3    chgd to [facility] per pt [facility] does not carry  . spironolactone (ALDACTONE) 50 MG tablet    Sig: Take 1 tablet (50 mg total) by mouth daily.    Dispense:  90 tablet    Refill:  1    I personally performed the services described in this documentation, which was scribed in my presence. The recorded information has been reviewed and considered, and addended by me as needed.   Amanda Curry, M.D.  Primary Care at [facility] [address] [place], [place] [address] [phone] phone [phone] fax  01/26/18 6:35 AM

## 2018-01-25 NOTE — Patient Instructions (Addendum)
   IF you received an x-ray today, you will receive an invoice from [facility]. Please contact [facility] at [phone] with questions or concerns regarding your invoice.   IF you received labwork today, you will receive an invoice from [facility]. Please contact [facility] at [phone] with questions or concerns regarding your invoice.   Our billing staff will not be able to assist you with questions regarding bills from these companies.  You will be contacted with the lab results as soon as they are available. The fastest way to get your results is to activate your My Chart account. Instructions are located on the last page of this paperwork. If you have not heard from us regarding the results in 2 weeks, please contact this office.     Keeping You Healthy  Get These Tests  Blood Pressure- Have your blood pressure checked by your healthcare provider at least once a year.  Normal blood pressure is 120/80.  Weight- Have your body mass index (BMI) calculated to screen for obesity.  BMI is a measure of body fat based on height and weight.  You can calculate your own BMI at www.nhlbisupport.com/bmi/  Cholesterol- Have your cholesterol checked every year.  Diabetes- Have your blood sugar checked every year if you have high blood pressure, high cholesterol, a family history of diabetes or if you are overweight.  Pap Test - Have a pap test every 1 to 5 years if you have been sexually active.  If you are older than 65 and recent pap tests have been normal you may not need additional pap tests.  In addition, if you have had a hysterectomy  for benign disease additional pap tests are not necessary.  Mammogram-Yearly mammograms are essential for early detection of breast cancer  Screening for Colon Cancer- Colonoscopy starting at age 50. Screening may begin sooner depending on your family history and other health conditions.  Follow up colonoscopy as directed by your  Gastroenterologist.  Screening for Osteoporosis- Screening begins at age 65 with bone density scanning, sooner if you are at higher risk for developing Osteoporosis.  Get these medicines  Calcium with Vitamin D- Your body requires 1200-1500 mg of Calcium a day and 800-1000 IU of Vitamin D a day.  You can only absorb 500 mg of Calcium at a time therefore Calcium must be taken in 2 or 3 separate doses throughout the day.  Hormones- Hormone therapy has been associated with increased risk for certain cancers and heart disease.  Talk to your healthcare provider about if you need relief from menopausal symptoms.  Aspirin- Ask your healthcare provider about taking Aspirin to prevent Heart Disease and Stroke.  Get these Immuniztions  Flu shot- Every fall  Pneumonia shot- Once after the age of 65; if you are younger ask your healthcare provider if you need a pneumonia shot.  Tetanus- Every ten years.  Zostavax- Once after the age of 60 to prevent shingles.  Take these steps  Don't smoke- Your healthcare provider can help you quit. For tips on how to quit, ask your healthcare provider or go to www.smokefree.gov or call 1-800 QUIT-NOW.  Be physically active- Exercise 5 days a week for a minimum of 30 minutes.  If you are not already physically active, start slow and gradually work up to 30 minutes of moderate physical activity.  Try walking, dancing, bike riding, swimming, etc.  Eat a healthy diet- Eat a variety of healthy foods such as fruits, vegetables, whole grains, low   fat milk, low fat cheeses, yogurt, lean meats, chicken, fish, eggs, dried beans, tofu, etc.  For more information go to www.thenutritionsource.org  Dental visit- Brush and floss teeth twice daily; visit your dentist twice a year.  Eye exam- Visit your Optometrist or Ophthalmologist yearly.  Drink alcohol in moderation- Limit alcohol intake to one drink or less a day.  Never drink and drive.  Depression- Your emotional  health is as important as your physical health.  If you're feeling down or losing interest in things you normally enjoy, please talk to your healthcare provider.  Seat Belts- can save your life; always wear one  Smoke/Carbon Monoxide detectors- These detectors need to be installed on the appropriate level of your home.  Replace batteries at least once a year.  Violence- If anyone is threatening or hurting you, please tell your healthcare provider.  Living Will/ Health care power of attorney- Discuss with your healthcare provider and family.  

## 2018-01-26 ENCOUNTER — Encounter: Payer: Self-pay | Admitting: Family Medicine

## 2018-01-26 DIAGNOSIS — R7303 Prediabetes: Secondary | ICD-10-CM | POA: Insufficient documentation

## 2018-01-26 DIAGNOSIS — N8110 Cystocele, unspecified: Secondary | ICD-10-CM | POA: Insufficient documentation

## 2018-01-26 DIAGNOSIS — R7989 Other specified abnormal findings of blood chemistry: Secondary | ICD-10-CM | POA: Insufficient documentation

## 2018-01-26 DIAGNOSIS — R3121 Asymptomatic microscopic hematuria: Secondary | ICD-10-CM | POA: Insufficient documentation

## 2018-01-26 LAB — BASIC METABOLIC PANEL
BUN/Creatinine Ratio: 19 (ref 12–28)
BUN: 19 mg/dL (ref 8–27)
CHLORIDE: 99 mmol/L (ref 96–106)
CO2: 22 mmol/L (ref 20–29)
Calcium: 9.9 mg/dL (ref 8.7–10.3)
Creatinine, Ser: 1 mg/dL (ref 0.57–1.00)
GFR calc Af Amer: 66 mL/min/{1.73_m2} (ref >59)
GFR calc non Af Amer: 58 mL/min/{1.73_m2} — ABNORMAL LOW (ref >59)
GLUCOSE: 105 mg/dL — AB (ref 65–99)
POTASSIUM: 4.9 mmol/L (ref 3.5–5.2)
SODIUM: 134 mmol/L (ref 134–144)

## 2018-01-26 LAB — TSH+T4F+T3FREE
Free T4: 1.15 ng/dL (ref 0.82–1.77)
T3 FREE: 2.8 pg/mL (ref 2.0–4.4)
TSH: 6.07 u[IU]/mL — AB (ref 0.450–4.500)

## 2018-01-26 MED ORDER — SPIRONOLACTONE 50 MG PO TABS: 50.0000 mg | ORAL_TABLET | Freq: Every day | ORAL | 1 refills | Status: DC

## 2018-01-26 MED ORDER — OLMESARTAN MEDOXOMIL 40 MG PO TABS: 40.0000 mg | ORAL_TABLET | Freq: Every day | ORAL | 3 refills | Status: DC

## 2018-01-26 MED ORDER — PITAVASTATIN CALCIUM 2 MG PO TABS: 1.0000 mg | ORAL_TABLET | Freq: Every day | ORAL | 3 refills | Status: DC

## 2018-02-05 DIAGNOSIS — H6121 Impacted cerumen, right ear: Secondary | ICD-10-CM | POA: Diagnosis not present

## 2018-02-14 ENCOUNTER — Encounter: Payer: Self-pay | Admitting: Physician Assistant

## 2018-02-14 ENCOUNTER — Ambulatory Visit (INDEPENDENT_AMBULATORY_CARE_PROVIDER_SITE_OTHER): Payer: PPO | Admitting: Physician Assistant

## 2018-02-14 ENCOUNTER — Other Ambulatory Visit: Payer: Self-pay

## 2018-02-14 VITALS — BP 110/62 | HR 90 | Temp 97.9°F | Ht 61.5 in | Wt 135.2 lb

## 2018-02-14 DIAGNOSIS — S40021A Contusion of right upper arm, initial encounter: Secondary | ICD-10-CM | POA: Diagnosis not present

## 2018-02-14 NOTE — Patient Instructions (Signed)
     IF you received an x-ray today, you will receive an invoice from [facility]. Please contact  Radiology at [phone] with questions or concerns regarding your invoice.   IF you received labwork today, you will receive an invoice from [facility]. Please contact [facility] at [phone] with questions or concerns regarding your invoice.   Our billing staff will not be able to assist you with questions regarding bills from these companies.  You will be contacted with the lab results as soon as they are available. The fastest way to get your results is to activate your My Chart account. Instructions are located on the last page of this paperwork. If you have not heard from us regarding the results in 2 weeks, please contact this office.     

## 2018-02-14 NOTE — Progress Notes (Signed)
02/14/2018 8:40 AM   DOB: September 27, 1950 / MRN: [id number]  SUBJECTIVE:  Amanda Curry is a 67 y.o. female presenting for bruise on arm.  Per chart review patient dose not take blood thinner. No hx of thrombocytopenia per last three CBC.  Patient tells me that she hit the arm on the corner of a post about the area of concern.  Denies weakness, numbness, tingling distally.  No radicular symptoms.  She is allergic to hctz [hydrochlorothiazide] and amlodipine.   She  has a past medical history of Arthritis, Diverticulosis, Ganglion of left wrist, Hyperlipidemia, Hypertension, and Tinnitus.    She  reports that she has never smoked. She has never used smokeless tobacco. She reports that she drinks alcohol. She reports that she does not use drugs. She  reports that she currently engages in sexual activity. She reports using the following methods of birth control/protection: Post-menopausal and Surgical. The patient  has a past surgical history that includes Tubal ligation and Tonsillectomy.  Her family history includes Alzheimer's disease in her mother; Cancer in her father, maternal grandmother, and mother; Colon cancer in her maternal grandmother; Heart disease in her paternal grandfather and paternal grandmother; Hyperlipidemia in her mother; Hypertension in her mother and sister; Prostate cancer in her father.  Review of Systems  Constitutional: Negative for chills, diaphoresis and fever.  Gastrointestinal: Negative for nausea.  Skin: Negative for rash.  Neurological: Negative for dizziness.    The problem list and medications were reviewed and updated by myself where necessary and exist elsewhere in the encounter.   OBJECTIVE:  BP 110/62 (BP Location: Left Arm, Patient Position: Sitting, Cuff Size: Normal)   Pulse 90   Temp 97.9 F (36.6 C) (Oral)   Ht 5' 1.5" (1.562 m)   Wt 135 lb 3.2 oz (61.3 kg)   SpO2 99%   BMI 25.13 kg/m   Physical Exam  Constitutional: She is oriented to  person, place, and time. She appears well-nourished. No distress.  Eyes: Pupils are equal, round, and reactive to light. EOM are normal.  Cardiovascular: Normal rate.  Pulmonary/Chest: Effort normal.  Abdominal: She exhibits no distension.  Musculoskeletal: She exhibits no edema, tenderness or deformity.       Arms: Neurological: She is alert and oriented to person, place, and time. No cranial nerve deficit. Gait normal.  Skin: Skin is dry. She is not diaphoretic.  Psychiatric: She has a normal mood and affect.  Vitals reviewed.   CBC Latest Ref Rng & Units 12/30/2017 08/05/2016 07/22/2015  WBC 3.4 - 10.8 x10E3/uL 5.9 6.9 6.2  Hemoglobin 11.1 - 15.9 g/dL 11.7 12.3 12.0  Hematocrit 34.0 - 46.6 % 35.7 36.2 34.4(L)  Platelets 150 - 379 x10E3/uL 344 318 331     No results found for this or any previous visit (from the past 72 hour(s)).  No results found.  ASSESSMENT AND PLAN:  Amanda Curry was seen today for bleeding/bruising.  Diagnoses and all orders for this visit:  Superficial bruising of arm, right, initial encounter Comments: 2/2 to blunt force trauma during yard work.  Deltoid, bicepps, tricepps tendons intact and no bony TTP. Pulses intact at brachial and radial. RTC as needed.     The patient is advised to call or return to clinic if she does not see an improvement in symptoms, or to seek the care of the closest emergency department if she worsens with the above plan.   Philis Fendt, MHS, PA-C [facility] 02/14/2018 8:40 AM

## 2018-02-28 ENCOUNTER — Other Ambulatory Visit: Payer: Self-pay | Admitting: Family Medicine

## 2018-03-10 DIAGNOSIS — L237 Allergic contact dermatitis due to plants, except food: Secondary | ICD-10-CM | POA: Diagnosis not present

## 2018-03-16 DIAGNOSIS — N8189 Other female genital prolapse: Secondary | ICD-10-CM | POA: Diagnosis not present

## 2018-04-05 DIAGNOSIS — N8182 Incompetence or weakening of pubocervical tissue: Secondary | ICD-10-CM | POA: Diagnosis not present

## 2018-05-28 ENCOUNTER — Other Ambulatory Visit: Payer: Self-pay | Admitting: Family Medicine

## 2018-05-28 NOTE — Telephone Encounter (Signed)
olmesartan refill Last Refill:02/28/18 # 90 No RF Last OV: 01/25/18 (has appt scheduled for 07/23/18) PCP: [name] MD Pharmacy:[facility] [address]  spironolactone refill Last Refill:02/28/18 # 90 No RF Last OV: 01/25/18  Filled until next appointment

## 2018-06-12 ENCOUNTER — Other Ambulatory Visit (INDEPENDENT_AMBULATORY_CARE_PROVIDER_SITE_OTHER): Payer: PPO

## 2018-06-12 ENCOUNTER — Encounter: Payer: Self-pay | Admitting: Gastroenterology

## 2018-06-12 ENCOUNTER — Ambulatory Visit (INDEPENDENT_AMBULATORY_CARE_PROVIDER_SITE_OTHER): Payer: PPO | Admitting: Gastroenterology

## 2018-06-12 VITALS — BP 130/80 | HR 80 | Ht 61.5 in | Wt 130.6 lb

## 2018-06-12 DIAGNOSIS — R197 Diarrhea, unspecified: Secondary | ICD-10-CM | POA: Diagnosis not present

## 2018-06-12 DIAGNOSIS — K5730 Diverticulosis of large intestine without perforation or abscess without bleeding: Secondary | ICD-10-CM | POA: Diagnosis not present

## 2018-06-12 DIAGNOSIS — Z860100 Personal history of colon polyps, unspecified: Secondary | ICD-10-CM

## 2018-06-12 DIAGNOSIS — Z800 Family history of malignant neoplasm of digestive organs: Secondary | ICD-10-CM

## 2018-06-12 LAB — SEDIMENTATION RATE: SED RATE: 31 mm/h — AB (ref 0–30)

## 2018-06-12 LAB — HIGH SENSITIVITY CRP: CRP HIGH SENSITIVITY: 0.58 mg/L (ref 0.000–5.000)

## 2018-06-12 NOTE — Patient Instructions (Signed)
Your provider has requested that you go to the basement level for lab work before leaving today. Press "B" on the elevator. The lab is located at the first door on the left as you exit the elevator.  Try a daily probiotic such as Align.

## 2018-06-12 NOTE — Progress Notes (Signed)
Referring Provider: Shawnee Knapp, MD Primary Care Physician:  Shawnee Knapp, MD   Reason for Consultation:  diarrhea   IMPRESSION:  Diarrhea x 6 weeks History of colon polyps    - polypoid mucosa on colonoscopy 2009    - 25m descending colon tubular adenoma on colonoscopy 2016    - surveillance colonoscopy 2021 Descending colon diverticulosis Family history of colon cancer (maternal grandmother)  PLAN: - Stool culture panel, CRP, ESR, fecal calprotectin, Giardia - Trial of daiy probiotics while we are awaiting stool studies. Align coupon provided.   HPI: Amanda COLBURNis a 67y.o. female seen in consultation at the request of Dr. SBrigitte Pulsefor further evaluation of diarrhea.  The history is obtained through the patient and review of her electronic health record.  She was previously followed by Dr. BMaurene Capesfor a diagnosis of IBS. She was last seen 09/2014.  She had a colonoscopy in 2009 that showed colon polyps but the pathology reports described polypoid mucosa. Patient was seen in July 2014 for acute diarrheal illness which responded to Flagyl. Records from 2016 note occasional gas and bloating and concerns for a recurrent infection similar to 2014. Colonoscopy with Dr. BMaurene Capes2/23/2016 showed moderate diverticulosis in the descending colon and a 432mdescending colon tubular adenoma. There is a family history of colon cancer in her grandmother.  Six weeks ago she developed the acute onset of watery, explosive diarrhea. This initially occurred over a couple of days. Symptoms somewhat improved, but she will continue to have recurrent symptoms at least every week since then. She has had accidents with significant urgency.  Associated borboygmous and fatigue. She has required frequent naps.  Using Immodium AD weekly. No fevers, chills, or night sweats. No blood or mucous. Normal appetite.  Lost three pounds but this is intentional. Her fall clothes are tight and she is cutting back on foods that she  doesn't need. She took three weeks of Florastor with improvement in her symptoms while using the maximum dose. When she titrated the dose, she did not have as much symptomatic relief. No other associated symptoms. No identified exacerbating or relieving features.      Past Medical History:  Diagnosis Date  . Arthritis   . Diverticulosis   . Ganglion of left wrist    thumb joint  . Hyperlipidemia   . Hypertension   . Tinnitus     Past Surgical History:  Procedure Laterality Date  . TONSILLECTOMY    . TUBAL LIGATION      Prior to Admission medications   Medication Sig Start Date End Date Taking? Authorizing Provider  Calcium Carbonate (CALCIUM 600 PO) Take by mouth 2 (two) times daily.    [provider]  glucosamine-chondroitin 500-400 MG tablet Take 1 tablet by mouth 3 (three) times daily.    [provider]  loperamide (IMODIUM A-D) 2 MG tablet Take 2 mg by mouth 4 (four) times daily as needed for diarrhea or loose stools.    [provider]  Melatonin 3 MG TABS Take 1 tablet by mouth at bedtime as needed (sleep).    [provider]  Multiple Vitamin (MULTIVITAMIN WITH MINERALS) TABS Take 1 tablet by mouth daily.    [provider]  olmesartan (BENICAR) 40 MG tablet TAKE 1 TABLET BY MOUTH EVERY DAY 05/28/18   ShShawnee KnappMD  Pitavastatin Calcium (LIVALO) 2 MG TABS Take 0.5 tablets (1 mg total) by mouth daily. 01/26/18   ShDelman Cheadle  N, MD  spironolactone (ALDACTONE) 50 MG tablet TAKE 1 TABLET BY MOUTH EVERY DAY 05/28/18   Shawnee Knapp, MD    Current Outpatient Medications  Medication Sig Dispense Refill  . Calcium Carbonate (CALCIUM 600 PO) Take by mouth 2 (two) times daily.    Marland Kitchen glucosamine-chondroitin 500-400 MG tablet Take 1 tablet by mouth 3 (three) times daily.    Marland Kitchen loperamide (IMODIUM A-D) 2 MG tablet Take 2 mg by mouth 4 (four) times daily as needed for diarrhea or loose stools.    . Melatonin 3 MG TABS Take 1 tablet by mouth at  bedtime as needed (sleep).    . Multiple Vitamin (MULTIVITAMIN WITH MINERALS) TABS Take 1 tablet by mouth daily.    Marland Kitchen olmesartan (BENICAR) 40 MG tablet TAKE 1 TABLET BY MOUTH EVERY DAY 60 tablet 0  . Pitavastatin Calcium (LIVALO) 2 MG TABS Take 0.5 tablets (1 mg total) by mouth daily. 45 tablet 3  . spironolactone (ALDACTONE) 50 MG tablet TAKE 1 TABLET BY MOUTH EVERY DAY 60 tablet 0   No current facility-administered medications for this visit.     Allergies as of 06/12/2018 - Review Complete 02/14/2018  Allergen Reaction Noted  . Hctz [hydrochlorothiazide] Other (See Comments) 10/22/2015  . Amlodipine Swelling 01/26/2018    Family History  Problem Relation Age of Onset  . Colon cancer Maternal Grandmother   . Cancer Maternal Grandmother        colon  . Prostate cancer Father   . Cancer Father        prostate  . Alzheimer's disease Mother   . Cancer Mother   . Hyperlipidemia Mother   . Hypertension Mother   . Heart disease Paternal Grandmother   . Heart disease Paternal Grandfather   . Hypertension Sister     Social History   Socioeconomic History  . Marital status: Married    Spouse name: [name]  . Number of children: 4  . Years of education: Not on file  . Highest education level: Not on file  Occupational History  . 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 Financial resource strain: Not on file  . Food insecurity:    Worry: Not on file    Inability: Not on file  . Transportation needs:    Medical: Not on file    Non-medical: Not on file  Tobacco Use  . Smoking status: Never Smoker  . Smokeless tobacco: Never Used  Substance and Sexual Activity  . Alcohol use: Yes    Comment: rarely; 1-2x/mo  . Drug use: No  . Sexual activity: Yes    Birth control/protection: Post-menopausal, Surgical  Lifestyle  . Physical activity:    Days per week: Not on file    Minutes per session: Not on file  . Stress: Not on file    Relationships  . Social connections:    Talks on phone: Not on file    Gets together: Not on file    Attends religious service: Not on file    Active member of club or organization: Not on file    Attends meetings of clubs or organizations: Not on file    Relationship status: Not on file  . Intimate partner violence:    Fear of current or ex partner: Not on file    Emotionally abused: Not on file    Physically abused: Not on file    Forced sexual activity: Not on file  Other Topics  Concern  . Not on file  Social History Narrative   Lives with her husband.   Adult children. Two live in [place].    Review of Systems: 12 system ROS is negative except as noted above.   Physical Exam: @VSRANGES @   General:   Alert,  Well-developed, well-nourished, pleasant and cooperative in NAD Head:  Normocephalic and atraumatic. Sclera anicteric.  Mouth:  No deformity or lesions.   Neck:  Supple; no masses or thyromegaly. Lungs:  Clear throughout to auscultation.   No wheezes, crackles, or rhonchi.  Heart:  Regular rate and rhythm; no murmurs, clicks, rubs,  or gallops. Abdomen:  Soft,nontender, BS active,nonpalp mass or hsm.   Rectal:  Deferred  Msk:  Symmetrical without gross deformities. . Extremities:  No edema. Neurologic:  Alert and  oriented x4;  grossly normal neurologically. Skin:  Intact without significant lesions or rashes.. Psych:  Alert and cooperative. Normal mood and affect.  Thornton Park, MD, MPH [facility]

## 2018-06-13 ENCOUNTER — Other Ambulatory Visit: Payer: PPO

## 2018-06-13 DIAGNOSIS — R197 Diarrhea, unspecified: Secondary | ICD-10-CM | POA: Diagnosis not present

## 2018-06-13 DIAGNOSIS — Z86010 Personal history of colonic polyps: Secondary | ICD-10-CM | POA: Diagnosis not present

## 2018-06-13 DIAGNOSIS — Z800 Family history of malignant neoplasm of digestive organs: Secondary | ICD-10-CM

## 2018-06-13 DIAGNOSIS — K5730 Diverticulosis of large intestine without perforation or abscess without bleeding: Secondary | ICD-10-CM

## 2018-06-14 DIAGNOSIS — Z124 Encounter for screening for malignant neoplasm of cervix: Secondary | ICD-10-CM | POA: Diagnosis not present

## 2018-06-14 DIAGNOSIS — N8182 Incompetence or weakening of pubocervical tissue: Secondary | ICD-10-CM | POA: Diagnosis not present

## 2018-06-14 LAB — GIARDIA ANTIGEN
MICRO NUMBER: 91244053
RESULT:: NOT DETECTED
SPECIMEN QUALITY: ADEQUATE

## 2018-06-18 LAB — STOOL CULTURE: E COLI SHIGA TOXIN ASSAY: NEGATIVE

## 2018-06-18 LAB — CALPROTECTIN, FECAL: Calprotectin, Fecal: 36 ug/g (ref 0–120)

## 2018-07-23 ENCOUNTER — Other Ambulatory Visit: Payer: Self-pay | Admitting: Family Medicine

## 2018-07-23 ENCOUNTER — Encounter: Payer: Self-pay | Admitting: Family Medicine

## 2018-07-23 ENCOUNTER — Ambulatory Visit (INDEPENDENT_AMBULATORY_CARE_PROVIDER_SITE_OTHER): Payer: PPO | Admitting: Family Medicine

## 2018-07-23 ENCOUNTER — Other Ambulatory Visit: Payer: Self-pay

## 2018-07-23 VITALS — BP 106/71 | HR 85 | Temp 98.0°F | Resp 16 | Ht 62.21 in | Wt 126.0 lb

## 2018-07-23 DIAGNOSIS — Z1231 Encounter for screening mammogram for malignant neoplasm of breast: Secondary | ICD-10-CM

## 2018-07-23 DIAGNOSIS — E785 Hyperlipidemia, unspecified: Secondary | ICD-10-CM

## 2018-07-23 DIAGNOSIS — N182 Chronic kidney disease, stage 2 (mild): Secondary | ICD-10-CM | POA: Diagnosis not present

## 2018-07-23 DIAGNOSIS — E038 Other specified hypothyroidism: Secondary | ICD-10-CM

## 2018-07-23 DIAGNOSIS — I10 Essential (primary) hypertension: Secondary | ICD-10-CM | POA: Diagnosis not present

## 2018-07-23 DIAGNOSIS — E039 Hypothyroidism, unspecified: Secondary | ICD-10-CM

## 2018-07-23 DIAGNOSIS — R7303 Prediabetes: Secondary | ICD-10-CM

## 2018-07-23 DIAGNOSIS — R634 Abnormal weight loss: Secondary | ICD-10-CM | POA: Diagnosis not present

## 2018-07-23 LAB — POCT GLYCOSYLATED HEMOGLOBIN (HGB A1C): Hemoglobin A1C: 6 % — AB (ref 4.0–5.6)

## 2018-07-23 NOTE — Progress Notes (Signed)
Subjective:    Patient: Amanda Curry  DOB: 03/31/1951; 67 y.o.   MRN: [id number]  Chief Complaint  Patient presents with  . Hypertension    6 month follow-up   . Hyperlipidemia    HPI Sev mos ago had diarrhea for a while so went to see GI and was told she might be developing a wheat allergy so has been off of wheat. Still eating sugar - made fudge last week.  Gluten-free corn chex, lots of veggies and meat.  Regular food - not exciting.  Instant potatoes. Not a ton of fruit.  Has a little mini-coke - 8 oz not even every day. A little bit of water. A lot of milk. 1-2c of coffee with 1/2 teaspoon.  No longer doing daily cinnamon buns.  Used to eat a good amount of bread but stopped past couple of mos due to wheat avoidance.  Did have 1d of sig bloating, tired, then followed by diarrhea once a week. She thinks the prolonged diarrhea was just taking a while from the viral infxn to resolve.   Lost some weight. Signed up for silver sneakers at [facility] but hasn't gone to much as husband has sciatica - MRI tonight at 8 pm .   Drinking 2-3c milk/d sometimes more Only supplements   Thinks weight loss is due to cutting out the wheat.   Thinking about having [place] surgery due to cystocele as pessary is annoying. [place] out.    Medical History Past Medical History:  Diagnosis Date  . Arthritis   . Diverticulosis   . Ganglion of left wrist    thumb joint  . Hyperlipidemia   . Hypertension   . Tinnitus    Past Surgical History:  Procedure Laterality Date  . TONSILLECTOMY    . TUBAL LIGATION     Current Outpatient Medications on File Prior to Visit  Medication Sig Dispense Refill  . Calcium Carbonate (CALCIUM 600 PO) Take by mouth 2 (two) times daily.    Marland Kitchen glucosamine-chondroitin 500-400 MG tablet Take 1 tablet by mouth 3 (three) times daily.    Marland Kitchen loperamide (IMODIUM A-D) 2 MG tablet Take 2 mg by mouth 4 (four) times daily as needed for diarrhea or loose stools.    . Melatonin 3 MG TABS  Take 1 tablet by mouth at bedtime as needed (sleep).    . Multiple Vitamin (MULTIVITAMIN WITH MINERALS) TABS Take 1 tablet by mouth daily.    Marland Kitchen olmesartan (BENICAR) 40 MG tablet TAKE 1 TABLET BY MOUTH EVERY DAY 60 tablet 0  . Pitavastatin Calcium (LIVALO) 2 MG TABS Take 0.5 tablets (1 mg total) by mouth daily. 45 tablet 3  . spironolactone (ALDACTONE) 50 MG tablet TAKE 1 TABLET BY MOUTH EVERY DAY 60 tablet 0   No current facility-administered medications on file prior to visit.    Allergies  Allergen Reactions  . Hctz [Hydrochlorothiazide] Other (See Comments)    Causes extreme hyponatremia quickly - 126  . Amlodipine Swelling    Mild but chronic lower extremity edema   Family History  Problem Relation Age of Onset  . Colon cancer Maternal Grandmother   . Cancer Maternal Grandmother        colon  . Prostate cancer Father   . Cancer Father        prostate  . Alzheimer's disease Mother   . Cancer Mother   . Hyperlipidemia Mother   . Hypertension Mother   . Heart disease Paternal Grandmother   .  Heart disease Paternal Grandfather   . Hypertension Sister    Social History   Socioeconomic History  . Marital status: Married    Spouse name: [name]  . Number of children: 4  . Years of education: Not on file  . Highest education level: Not on file  Occupational History  . 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 Financial resource strain: Not on file  . Food insecurity:    Worry: Not on file    Inability: Not on file  . Transportation needs:    Medical: Not on file    Non-medical: Not on file  Tobacco Use  . Smoking status: Never Smoker  . Smokeless tobacco: Never Used  Substance and Sexual Activity  . Alcohol use: Yes    Comment: rarely; 1-2x/mo  . Drug use: No  . Sexual activity: Yes    Birth control/protection: Post-menopausal, Surgical  Lifestyle  . Physical activity:    Days per week: Not on file    Minutes per session:  Not on file  . Stress: Not on file  Relationships  . Social connections:    Talks on phone: Not on file    Gets together: Not on file    Attends religious service: Not on file    Active member of club or organization: Not on file    Attends meetings of clubs or organizations: Not on file    Relationship status: Not on file  Other Topics Concern  . Not on file  Social History Narrative   Lives with her husband.   Adult children. Two live in [place].   Depression screen [facility] 2/9 02/14/2018 01/25/2018 09/09/2017 05/05/2017 12/15/2016  Decreased Interest 0 0 0 0 0  Down, Depressed, Hopeless 0 0 0 0 0  PHQ - 2 Score 0 0 0 0 0    Review of Systems  Constitutional: Positive for malaise/fatigue and weight loss. Negative for chills, diaphoresis and fever.  Cardiovascular: Negative for leg swelling.  Genitourinary: Negative for frequency and urgency.  Musculoskeletal: Negative for falls.  Neurological: Negative for headaches.  Endo/Heme/Allergies: Negative for polydipsia.   As noted in HPI  Objective:  BP 106/71   Pulse 85   Temp 98 F (36.7 C) (Oral)   Resp 16   Ht 5' 2.21" (1.58 m)   Wt 126 lb (57.2 kg)   SpO2 98%   BMI 22.89 kg/m  Physical Exam  Constitutional: She is oriented to person, place, and time. She appears well-developed and well-nourished. No distress.  HENT:  Head: Normocephalic and atraumatic.  Right Ear: External ear normal.  Left Ear: External ear normal.  Eyes: Conjunctivae are normal. No scleral icterus.  Neck: Normal range of motion. Neck supple. No thyromegaly present.  Cardiovascular: Normal rate, regular rhythm, normal heart sounds and intact distal pulses.  Pulmonary/Chest: Effort normal and breath sounds normal. No respiratory distress.  Musculoskeletal: She exhibits no edema.  Lymphadenopathy:    She has no cervical adenopathy.  Neurological: She is alert and oriented to person, place, and time.  Skin: Skin is warm and dry. She is not diaphoretic.  No erythema.  Psychiatric: She has a normal mood and affect. Her behavior is normal.    [place] TESTING Office Visit on 07/23/2018  Component Date Value Ref Range Status  . Hemoglobin A1C 07/23/2018 6.0* 4.0 - 5.6 % Final    Lab Results  Component Value Date   HGBA1C 6.0 (A) 07/23/2018   HGBA1C  5.8 (H) 12/30/2017   HGBA1C 5.7 (H) 12/15/2016    Assessment & Plan:   1. Essential hypertension - cont olmesartan 40 and spironolactone 50 qd - well controlled  2. Hyperlipidemia, unspecified hyperlipidemia type - cont pitavastatin 1mg  qd, not fasting today for labs so will recheck in 6 mos  3. Prediabetes - new diagnosis - pt already has a pretty good diet - not overdoing carbs or sugar and is of normal weight at 66 yo - in fact - I am concerned that she has LOST 10 lbs over the past 5 mos so really don't want her to loose anymore - would like ot see her gain back the 10. But to balance that with new DM def needs nutritionist guidance - hopefully [facility] niutrition/DM ed is still doing the medicare pre-DM program so will refer her for that - excellent candidate - pt agreeable.  Wt Readings from Last 3 Encounters:  07/23/18 126 lb (57.2 kg)  06/12/18 130 lb 9.6 oz (59.2 kg)  02/14/18 135 lb 3.2 oz (61.3 kg)     4. Stage 2 chronic kidney disease   5. Subclinical hypothyroidism   6. Encounter for screening mammogram for breast cancer   7.      Weight loss, unintentional - monitor closely - if losing any more, RTC immed. RTC for CPE/AWV in 6 mos.  Patient will continue on current chronic medications other than changes noted above, so ok to refill when needed.   See after visit summary for patient specific instructions.  Orders Placed This Encounter  Procedures  . MM DIGITAL SCREENING BILATERAL    HTA PF:    Standing Status:   Future    Standing Expiration Date:   09/23/2019    Order Specific Question:   Reason for Exam (SYMPTOM  OR DIAGNOSIS REQUIRED)    Answer:   ANNUAL    Order Specific  Question:   Preferred imaging location?    Answer:   [facility]  . Comprehensive metabolic panel    Order Specific Question:   Has the patient fasted?    Answer:   Yes  . Lipid panel    Order Specific Question:   Has the patient fasted?    Answer:   Yes  . TSH+T4F+T3Free  . Microalbumin / creatinine urine ratio  . Ambulatory referral to diabetic education    Referral Priority:   Routine    Referral Type:   Consultation    Referral Reason:   Specialty Services Required    Number of Visits Requested:   1  . POCT glycosylated hemoglobin (Hb A1C)    Patient verbalized to me that they understand the following: diagnosis, what is being done for them, what to expect and what should be done at home.  Their questions have been answered. They understand that I am unable to predict every possible medication interaction or adverse outcome and that if any unexpected symptoms arise, they should contact us and their pharmacist, as well as never hesitate to seek urgent/emergent care at [facility] or ER if they think it might be warranted.    [name], MD, MPH [facility] [address] [place], [place]  [phone] Office phone  [phone] Office fax  07/23/18 8:11 AM

## 2018-07-23 NOTE — Patient Instructions (Addendum)
If you have lab work done today you will be contacted with your lab results within the next 2 weeks.  If you have not heard from [place] then please contact us. The fastest way to get your results is to register for My Chart.   IF you received an x-ray today, you will receive an invoice from [facility]. Please contact [facility] at [phone] with questions or concerns regarding your invoice.   IF you received labwork today, you will receive an invoice from [facility]. Please contact [facility] at [phone] with questions or concerns regarding your invoice.   Our billing staff will not be able to assist you with questions regarding bills from these companies.  You will be contacted with the lab results as soon as they are available. The fastest way to get your results is to activate your My Chart account. Instructions are located on the last page of this paperwork. If you have not heard from [place] regarding the results in 2 weeks, please contact this office.     Prediabetes Prediabetes is the condition of having a blood sugar (blood glucose) level that is higher than it should be, but not high enough for you to be diagnosed with type 2 diabetes. Having prediabetes puts you at risk for developing type 2 diabetes (type 2 diabetes mellitus). Prediabetes may be called impaired glucose tolerance or impaired fasting glucose. Prediabetes usually does not cause symptoms. Your health care provider can diagnose this condition with blood tests. You may be tested for prediabetes if you are overweight and if you have at least one other risk factor for prediabetes. Risk factors for prediabetes include:  Having a family member with type 2 diabetes.  Being overweight or obese.  Being older than age 73.  Being of American-Indian, African-American, Hispanic/Latino, or Asian/Pacific Islander descent.  Having an inactive (sedentary) lifestyle.  Having a history of gestational diabetes or  polycystic ovarian syndrome (PCOS).  Having low levels of good cholesterol (HDL-C) or high levels of blood fats (triglycerides).  Having high blood pressure.  What is blood glucose and how is blood glucose measured?  Blood glucose refers to the amount of glucose in your bloodstream. Glucose comes from eating foods that contain sugars and starches (carbohydrates) that the body breaks down into glucose. Your blood glucose level may be measured in mg/dL (milligrams per deciliter) or mmol/L (millimoles per liter).Your blood glucose may be checked with one or more of the following blood tests:  A fasting blood glucose (FBG) test. You will not be allowed to eat (you will fast) for at least 8 hours before a blood sample is taken. ? A normal range for FBG is 70-100 mg/dl (3.9-5.6 mmol/L).  An A1c (hemoglobin A1c) blood test. This test provides information about blood glucose control over the previous 2?29months.  An oral glucose tolerance test (OGTT). This test measures your blood glucose twice: ? After fasting. This is your baseline level. ? Two hours after you drink a beverage that contains glucose.  You may be diagnosed with prediabetes:  If your FBG is 100?125 mg/dL (5.6-6.9 mmol/L).  If your A1c level is 5.7?6.4%.  If your OGGT result is 140?199 mg/dL (7.8-11 mmol/L).  These blood tests may be repeated to confirm your diagnosis. What happens if blood glucose is too high? The pancreas produces a hormone (insulin) that helps move glucose from the bloodstream into cells. When cells in the body do not respond properly to insulin that the body makes (insulin  resistance), excess glucose builds up in the blood instead of going into cells. As a result, high blood glucose (hyperglycemia) can develop, which can cause many complications. This is a symptom of prediabetes. What can happen if blood glucose stays higher than normal for a long time? Having high blood glucose for a long time is  dangerous. Too much glucose in your blood can damage your nerves and blood vessels. Long-term damage can lead to complications from diabetes, which may include:  Heart disease.  Stroke.  Blindness.  Kidney disease.  Depression.  Poor circulation in the feet and legs, which could lead to surgical removal (amputation) in severe cases.  How can prediabetes be prevented from turning into type 2 diabetes?  To help prevent type 2 diabetes, take the following actions:  Be physically active. ? Do moderate-intensity physical activity for at least 30 minutes on at least 5 days of the week, or as much as told by your health care provider. This could be brisk walking, biking, or water aerobics. ? Ask your health care provider what activities are safe for you. A mix of physical activities may be best, such as walking, swimming, cycling, and strength training.  Lose weight as told by your health care provider. ? Losing 5-7% of your body weight can reverse insulin resistance. ? Your health care provider can determine how much weight loss is best for you and can help you lose weight safely.  Follow a healthy meal plan. This includes eating lean proteins, complex carbohydrates, fresh fruits and vegetables, low-fat dairy products, and healthy fats. ? Follow instructions from your health care provider about eating or drinking restrictions. ? Make an appointment to see a diet and nutrition specialist (registered dietitian) to help you create a healthy eating plan that is right for you.  Do not smoke or use any tobacco products, such as cigarettes, chewing tobacco, and e-cigarettes. If you need help quitting, ask your health care provider.  Take over-the-counter and prescription medicines as told by your health care provider. You may be prescribed medicines that help lower the risk of type 2 diabetes.  This information is not intended to replace advice given to you by your health care provider. Make  sure you discuss any questions you have with your health care provider. Document Released: 12/07/2015 Document Revised: 01/21/2016 Document Reviewed: 10/06/2015 Elsevier Interactive Patient Education  2018 [employer].  Preventing Type 2 Diabetes Mellitus Type 2 diabetes (type 2 diabetes mellitus) is a long-term (chronic) disease that affects blood sugar (glucose) levels. Normally, a hormone called insulin allows glucose to enter cells in the body. The cells use glucose for energy. In type 2 diabetes, one or both of these problems may be present:  The body does not make enough insulin.  The body does not respond properly to insulin that it makes (insulin resistance).  Insulin resistance or lack of insulin causes excess glucose to build up in the blood instead of going into cells. As a result, high blood glucose (hyperglycemia) develops, which can cause many complications. Being overweight or obese and having an inactive (sedentary) lifestyle can increase your risk for diabetes. Type 2 diabetes can be delayed or prevented by making certain nutrition and lifestyle changes. What nutrition changes can be made?  Eat healthy meals and snacks regularly. Keep a healthy snack with you for when you get hungry between meals, such as fruit or a handful of nuts.  Eat lean meats and proteins that are low in saturated fats, such  as chicken, fish, egg whites, and beans. Avoid processed meats.  Eat plenty of fruits and vegetables and plenty of grains that have not been processed (whole grains). It is recommended that you eat: ? 1?2 cups of fruit every day. ? 2?3 cups of vegetables every day. ? 6?8 oz of whole grains every day, such as oats, whole wheat, bulgur, brown rice, quinoa, and millet.  Eat low-fat dairy products, such as milk, yogurt, and cheese.  Eat foods that contain healthy fats, such as nuts, avocado, olive oil, and canola oil.  Drink water throughout the day. Avoid drinks that contain  added sugar, such as soda or sweet tea.  Follow instructions from your health care provider about specific eating or drinking restrictions.  Control how much food you eat at a time (portion size). ? Check food labels to find out the serving sizes of foods. ? Use a kitchen scale to weigh amounts of foods.  Saute or steam food instead of frying it. Cook with water or broth instead of oils or butter.  Limit your intake of: ? Salt (sodium). Have no more than 1 tsp (2,400 mg) of sodium a day. If you have heart disease or high blood pressure, have less than ? tsp (1,500 mg) of sodium a day. ? Saturated fat. This is fat that is solid at room temperature, such as butter or fat on meat. What lifestyle changes can be made?  Activity  Do moderate-intensity physical activity for at least 30 minutes on at least 5 days of the week, or as much as told by your health care provider.  Ask your health care provider what activities are safe for you. A mix of physical activities may be best, such as walking, swimming, cycling, and strength training.  Try to add physical activity into your day. For example: ? Park in spots that are farther away than usual, so that you walk more. For example, park in a far corner of the parking lot when you go to the office or the grocery store. ? Take a walk during your lunch break. ? Use stairs instead of elevators or escalators. Weight Loss  Lose weight as directed. Your health care provider can determine how much weight loss is best for you and can help you lose weight safely.  If you are overweight or obese, you may be instructed to lose at least 5?7 % of your body weight. Alcohol and Tobacco   Limit alcohol intake to no more than 1 drink a day for nonpregnant women and 2 drinks a day for men. One drink equals 12 oz of beer, 5 oz of wine, or 1 oz of hard liquor.  Do not use any tobacco products, such as cigarettes, chewing tobacco, and e-cigarettes. If you need  help quitting, ask your health care provider. Work With [place] Provider  Have your blood glucose tested regularly, as told by your health care provider.  Discuss your risk factors and how you can reduce your risk for diabetes.  Get screening tests as told by your health care provider. You may have screening tests regularly, especially if you have certain risk factors for type 2 diabetes.  Make an appointment with a diet and nutrition specialist (registered dietitian). A registered dietitian can help you make a healthy eating plan and can help you understand portion sizes and food labels. Why are these changes important?  It is possible to prevent or delay type 2 diabetes and related health problems by making  lifestyle and nutrition changes.  It can be difficult to recognize signs of type 2 diabetes. The best way to avoid possible damage to your body is to take actions to prevent the disease before you develop symptoms. What can happen if changes are not made?  Your blood glucose levels may keep increasing. Having high blood glucose for a long time is dangerous. Too much glucose in your blood can damage your blood vessels, heart, kidneys, nerves, and eyes.  You may develop prediabetes or type 2 diabetes. Type 2 diabetes can lead to many chronic health problems and complications, such as: ? Heart disease. ? Stroke. ? Blindness. ? Kidney disease. ? Depression. ? Poor circulation in the feet and legs, which could lead to surgical removal (amputation) in severe cases. Where to find support:  Ask your health care provider to recommend a registered dietitian, diabetes educator, or weight loss program.  Look for local or online weight loss groups.  Join a gym, fitness club, or outdoor activity group, such as a walking club. Where to find more information: To learn more about diabetes and diabetes prevention, visit:  American Diabetes Association (ADA):  www.diabetes.[employer] of Diabetes and Digestive and Kidney Diseases: FindSpin.nl  To learn more about healthy eating, visit:  The U.S. Department of Agriculture Scientist, research (physical sciences)), Choose My Plate: http://wiley-williams.com/  Office of Disease Prevention and Health Promotion (ODPHP), Dietary Guidelines: SurferLive.at  Summary  You can reduce your risk for type 2 diabetes by increasing your physical activity, eating healthy foods, and losing weight as directed.  Talk with your health care provider about your risk for type 2 diabetes. Ask about any blood tests or screening tests that you need to have. This information is not intended to replace advice given to you by your health care provider. Make sure you discuss any questions you have with your health care provider. Document Released: 12/07/2015 Document Revised: 01/21/2016 Document Reviewed: 10/06/2015 Elsevier Interactive Patient Education  2018 [employer].  Prediabetes Eating Plan Prediabetes-also called impaired glucose tolerance or impaired fasting glucose-is a condition that causes blood sugar (blood glucose) levels to be higher than normal. Following a healthy diet can help to keep prediabetes under control. It can also help to lower the risk of type 2 diabetes and heart disease, which are increased in people who have prediabetes. Along with regular exercise, a healthy diet: Promotes weight loss. Helps to control blood sugar levels. Helps to improve the way that the body uses insulin.  What do I need to know about this eating plan? Use the glycemic index (GI) to plan your meals. The index tells you how quickly a food will raise your blood sugar. Choose low-GI foods. These foods take a longer time to raise blood sugar. Pay close attention to the amount of carbohydrates in the food that you eat. Carbohydrates increase blood sugar levels. Keep track of how many  calories you take in. Eating the right amount of calories will help you to achieve a healthy weight. Losing about 7 percent of your starting weight can help to prevent type 2 diabetes. You may want to follow a Mediterranean diet. This diet includes a lot of vegetables, lean meats or fish, whole grains, fruits, and healthy oils and fats. What foods can I eat? Grains Whole grains, such as whole-wheat or whole-grain breads, crackers, cereals, and pasta. Unsweetened oatmeal. Bulgur. Barley. Quinoa. Brown rice. Corn or whole-wheat flour tortillas or taco shells. Vegetables Lettuce. Spinach. Peas. Beets. Cauliflower. Cabbage. Broccoli. Carrots. Tomatoes.  Squash. Eggplant. Herbs. Peppers. Onions. Cucumbers. Brussels sprouts. Fruits Berries. Bananas. Apples. Oranges. Grapes. Papaya. Mango. Pomegranate. Kiwi. Grapefruit. Cherries. Meats and Other Protein Sources Seafood. Lean meats, such as chicken and [place] or lean cuts of pork and beef. Tofu. Eggs. Nuts. Beans. Dairy Low-fat or fat-free dairy products, such as yogurt, cottage cheese, and cheese. Beverages Water. Tea. Coffee. Sugar-free or diet soda. Seltzer water. Milk. Milk alternatives, such as soy or almond milk. Condiments Mustard. Relish. Low-fat, low-sugar ketchup. Low-fat, low-sugar barbecue sauce. Low-fat or fat-free mayonnaise. Sweets and Desserts Sugar-free or low-fat pudding. Sugar-free or low-fat ice cream and other frozen treats. Fats and Oils Avocado. Walnuts. Olive oil. The items listed above may not be a complete list of recommended foods or beverages. Contact your dietitian for more options. What foods are not recommended? Grains Refined white flour and flour products, such as bread, pasta, snack foods, and cereals. Beverages Sweetened drinks, such as sweet iced tea and soda. Sweets and Desserts Baked goods, such as cake, cupcakes, pastries, cookies, and cheesecake. The items listed above may not be a complete list of foods  and beverages to avoid. Contact your dietitian for more information. This information is not intended to replace advice given to you by your health care provider. Make sure you discuss any questions you have with your health care provider. Document Released: 12/30/2014 Document Revised: 01/21/2016 Document Reviewed: 09/10/2014 Elsevier Interactive Patient Education  2017 [employer].

## 2018-07-24 LAB — TSH+T4F+T3FREE
Free T4: 1.11 ng/dL (ref 0.82–1.77)
T3, Free: 2.6 pg/mL (ref 2.0–4.4)
TSH: 6.87 u[IU]/mL — AB (ref 0.450–4.500)

## 2018-07-24 LAB — COMPREHENSIVE METABOLIC PANEL
ALBUMIN: 4.4 g/dL (ref 3.6–4.8)
ALT: 14 IU/L (ref 0–32)
AST: 18 IU/L (ref 0–40)
Albumin/Globulin Ratio: 1.8 (ref 1.2–2.2)
Alkaline Phosphatase: 65 IU/L (ref 39–117)
BILIRUBIN TOTAL: 0.3 mg/dL (ref 0.0–1.2)
BUN / CREAT RATIO: 24 (ref 12–28)
BUN: 24 mg/dL (ref 8–27)
CHLORIDE: 97 mmol/L (ref 96–106)
CO2: 22 mmol/L (ref 20–29)
Calcium: 9.5 mg/dL (ref 8.7–10.3)
Creatinine, Ser: 1 mg/dL (ref 0.57–1.00)
GFR calc non Af Amer: 58 mL/min/{1.73_m2} — ABNORMAL LOW (ref >59)
GFR, EST AFRICAN AMERICAN: 66 mL/min/{1.73_m2} (ref >59)
GLOBULIN, TOTAL: 2.4 g/dL (ref 1.5–4.5)
Glucose: 104 mg/dL — ABNORMAL HIGH (ref 65–99)
POTASSIUM: 4.7 mmol/L (ref 3.5–5.2)
SODIUM: 133 mmol/L — AB (ref 134–144)
TOTAL PROTEIN: 6.8 g/dL (ref 6.0–8.5)

## 2018-07-24 LAB — MICROALBUMIN / CREATININE URINE RATIO: CREATININE, UR: 57 mg/dL

## 2018-07-24 LAB — LIPID PANEL
CHOL/HDL RATIO: 3.2 ratio (ref 0.0–4.4)
Cholesterol, Total: 196 mg/dL (ref 100–199)
HDL: 61 mg/dL (ref >39)
LDL CALC: 105 mg/dL — AB (ref 0–99)
Triglycerides: 149 mg/dL (ref 0–149)
VLDL CHOLESTEROL CAL: 30 mg/dL (ref 5–40)

## 2018-07-28 DIAGNOSIS — R634 Abnormal weight loss: Secondary | ICD-10-CM | POA: Insufficient documentation

## 2018-08-06 DIAGNOSIS — Z1231 Encounter for screening mammogram for malignant neoplasm of breast: Secondary | ICD-10-CM | POA: Diagnosis not present

## 2018-08-09 DIAGNOSIS — M25561 Pain in right knee: Secondary | ICD-10-CM | POA: Diagnosis not present

## 2018-08-09 DIAGNOSIS — M1711 Unilateral primary osteoarthritis, right knee: Secondary | ICD-10-CM | POA: Diagnosis not present

## 2018-08-15 DIAGNOSIS — L259 Unspecified contact dermatitis, unspecified cause: Secondary | ICD-10-CM | POA: Diagnosis not present

## 2018-08-15 DIAGNOSIS — I10 Essential (primary) hypertension: Secondary | ICD-10-CM | POA: Diagnosis not present

## 2018-08-15 DIAGNOSIS — H6591 Unspecified nonsuppurative otitis media, right ear: Secondary | ICD-10-CM | POA: Diagnosis not present

## 2018-08-28 ENCOUNTER — Ambulatory Visit: Payer: PPO | Admitting: Family Medicine

## 2018-08-28 ENCOUNTER — Telehealth: Payer: Self-pay | Admitting: Family Medicine

## 2018-08-28 NOTE — Telephone Encounter (Signed)
Called and spoke with pt regarding her appt that the [facility] made this morning before they were informed that Dr. Pamella Pert was out sick. I made an appt for her with Dr. Pamella Pert on Friday 08/31/18 with the understanding that Dr. Pamella Pert may or may not be back then. I advised that if we found out that Dr. Pamella Pert would not be here then we would certainly call her. Pt acknowledged.

## 2018-08-31 ENCOUNTER — Ambulatory Visit: Payer: PPO | Admitting: Family Medicine

## 2018-09-03 DIAGNOSIS — N8182 Incompetence or weakening of pubocervical tissue: Secondary | ICD-10-CM | POA: Diagnosis not present

## 2018-09-04 DIAGNOSIS — L814 Other melanin hyperpigmentation: Secondary | ICD-10-CM | POA: Diagnosis not present

## 2018-09-04 DIAGNOSIS — Z23 Encounter for immunization: Secondary | ICD-10-CM | POA: Diagnosis not present

## 2018-09-04 DIAGNOSIS — D225 Melanocytic nevi of trunk: Secondary | ICD-10-CM | POA: Diagnosis not present

## 2018-09-04 DIAGNOSIS — L821 Other seborrheic keratosis: Secondary | ICD-10-CM | POA: Diagnosis not present

## 2018-09-04 DIAGNOSIS — H01119 Allergic dermatitis of unspecified eye, unspecified eyelid: Secondary | ICD-10-CM | POA: Diagnosis not present

## 2018-09-04 DIAGNOSIS — Z85828 Personal history of other malignant neoplasm of skin: Secondary | ICD-10-CM | POA: Diagnosis not present

## 2018-09-04 DIAGNOSIS — L309 Dermatitis, unspecified: Secondary | ICD-10-CM | POA: Diagnosis not present

## 2018-09-04 DIAGNOSIS — D2271 Melanocytic nevi of right lower limb, including hip: Secondary | ICD-10-CM | POA: Diagnosis not present

## 2018-09-06 ENCOUNTER — Encounter: Payer: Self-pay | Admitting: Family Medicine

## 2018-09-11 ENCOUNTER — Encounter: Payer: Self-pay | Admitting: Family Medicine

## 2018-09-11 ENCOUNTER — Ambulatory Visit (INDEPENDENT_AMBULATORY_CARE_PROVIDER_SITE_OTHER): Payer: PPO | Admitting: Family Medicine

## 2018-09-11 ENCOUNTER — Other Ambulatory Visit: Payer: Self-pay

## 2018-09-11 VITALS — BP 123/77 | HR 98 | Temp 98.5°F | Resp 17 | Ht 62.21 in | Wt 128.2 lb

## 2018-09-11 DIAGNOSIS — I10 Essential (primary) hypertension: Secondary | ICD-10-CM

## 2018-09-11 DIAGNOSIS — R7989 Other specified abnormal findings of blood chemistry: Secondary | ICD-10-CM

## 2018-09-11 DIAGNOSIS — E871 Hypo-osmolality and hyponatremia: Secondary | ICD-10-CM

## 2018-09-11 DIAGNOSIS — Z5181 Encounter for therapeutic drug level monitoring: Secondary | ICD-10-CM

## 2018-09-11 NOTE — Patient Instructions (Addendum)
   1. Stop taking olmesartan  2. Continue spironolactone daily 3. Will let you know result of your thyroid test 4. Return in 4 weeks to discuss diarrhea and medication side effects. We will recheck your blood pressure.  If you have lab work done today you will be contacted with your lab results within the next 2 weeks.  If you have not heard from [place] then please contact us. The fastest way to get your results is to register for My Chart.   IF you received an x-ray today, you will receive an invoice from [facility]. Please contact [facility] at [phone] with questions or concerns regarding your invoice.   IF you received labwork today, you will receive an invoice from [place]. Please contact [facility] at [phone] with questions or concerns regarding your invoice.   Our billing staff will not be able to assist you with questions regarding bills from these companies.  You will be contacted with the lab results as soon as they are available. The fastest way to get your results is to activate your My Chart account. Instructions are located on the last page of this paperwork. If you have not heard from [place] regarding the results in 2 weeks, please contact this office.

## 2018-09-11 NOTE — Progress Notes (Signed)
Established Patient Office Visit  Subjective:  Patient ID: Amanda Curry, female    DOB: 20-Sep-1950  Age: 68 y.o. MRN: [id number]  CC:  Chief Complaint  Patient presents with  . Changed BP medication    wants to talk about olmesartan  . right eye lid    x few weeks and right ear has been bothering her as well    HPI Amanda Curry presents for  Six months ago patient had an intestinal infection with diarrhea and abdominal distention Wt Readings from Last 3 Encounters:  09/11/18 128 lb 3.2 oz (58.2 kg)  07/23/18 126 lb (57.2 kg)  06/12/18 130 lb 9.6 oz (59.2 kg)   She reports that she felt like the olmesartan has been causing her to have one episode of watery stools followed by normal bm. She typically takes a half an imodium.  She denies any pains or urgency. She states that she is wondering if it is medication side effect or something she is eating It occurs at different times  Hypertension: Patient here for follow-up of elevated blood pressure. She is exercising and is adherent to low salt diet.  Blood pressure is well controlled at home. Cardiac symptoms none. Patient denies chest pain, chest pressure/discomfort, dyspnea, irregular heart beat, lower extremity edema, near-syncope and palpitations.  Cardiovascular risk factors: dyslipidemia and hypertension. Use of agents associated with hypertension: none. History of target organ damage: none. BP Readings from Last 3 Encounters:  09/11/18 123/77  07/23/18 106/71  06/12/18 130/80   Abnormal TSH She has some abnormal tsh readings She also has weight loss, loose stools  She denies family history of thyroid disease She reports that she has dry skin but her Dermatologist says its due to cholesterol medication   Past Medical History:  Diagnosis Date  . Arthritis   . Diverticulosis   . Ganglion of left wrist    thumb joint  . Hyperlipidemia   . Hypertension   . Tinnitus     Past Surgical History:  Procedure Laterality  Date  . TONSILLECTOMY    . TUBAL LIGATION      Family History  Problem Relation Age of Onset  . Colon cancer Maternal Grandmother   . Cancer Maternal Grandmother        colon  . Prostate cancer Father   . Cancer Father        prostate  . Alzheimer's disease Mother   . Cancer Mother   . Hyperlipidemia Mother   . Hypertension Mother   . Heart disease Paternal Grandmother   . Heart disease Paternal Grandfather   . Hypertension Sister     Social History   Socioeconomic History  . Marital status: Married    Spouse name: [name]  . Number of children: 4  . Years of education: Not on file  . Highest education level: Not on file  Occupational History  . 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 Financial resource strain: Not on file  . Food insecurity:    Worry: Not on file    Inability: Not on file  . Transportation needs:    Medical: Not on file    Non-medical: Not on file  Tobacco Use  . Smoking status: Never Smoker  . Smokeless tobacco: Never Used  Substance and Sexual Activity  . Alcohol use: Yes    Comment: rarely; 1-2x/mo  . Drug use: No  . Sexual activity: Yes    Birth  control/protection: Post-menopausal, Surgical  Lifestyle  . Physical activity:    Days per week: Not on file    Minutes per session: Not on file  . Stress: Not on file  Relationships  . Social connections:    Talks on phone: Not on file    Gets together: Not on file    Attends religious service: Not on file    Active member of club or organization: Not on file    Attends meetings of clubs or organizations: Not on file    Relationship status: Not on file  . Intimate partner violence:    Fear of current or ex partner: Not on file    Emotionally abused: Not on file    Physically abused: Not on file    Forced sexual activity: Not on file  Other Topics Concern  . Not on file  Social History Narrative   Lives with her husband.   Adult children. Two  live in [place].    Outpatient Medications Prior to Visit  Medication Sig Dispense Refill  . estradiol (ESTRACE) 0.1 MG/GM vaginal cream INSERT 1 GRAM  INTRAVAGINALLY 1 TO 2  TIMES A WEEK  0  . glucosamine-chondroitin 500-400 MG tablet Take 1 tablet by mouth 3 (three) times daily.    . Melatonin 3 MG TABS Take 1 tablet by mouth at bedtime as needed (sleep).    . olmesartan (BENICAR) 40 MG tablet TAKE 1 TABLET BY MOUTH EVERY DAY 90 tablet 1  . Pitavastatin Calcium (LIVALO) 2 MG TABS Take 0.5 tablets (1 mg total) by mouth daily. 45 tablet 3  . spironolactone (ALDACTONE) 50 MG tablet TAKE 1 TABLET BY MOUTH EVERY DAY 90 tablet 1  . Calcium Carbonate (CALCIUM 600 PO) Take by mouth 2 (two) times daily.    . Calcium Carbonate-Vitamin D 600-400 MG-UNIT tablet Take by mouth.    . loperamide (IMODIUM A-D) 2 MG tablet Take 2 mg by mouth 4 (four) times daily as needed for diarrhea or loose stools.    . Multiple Vitamin (MULTIVITAMIN WITH MINERALS) TABS Take 1 tablet by mouth daily.     No facility-administered medications prior to visit.     Allergies  Allergen Reactions  . Hctz [Hydrochlorothiazide] Other (See Comments)    Causes extreme hyponatremia quickly - 126  . Amlodipine Swelling    Mild but chronic lower extremity edema    ROS Review of Systems See hpi  Review of Systems  Constitutional: Negative for activity change, appetite change, chills and fever.  HENT: Negative for congestion, nosebleeds, trouble swallowing and voice change.   Respiratory: Negative for cough, shortness of breath and wheezing.   Gastrointestinal: see hpi Genitourinary: Negative for difficulty urinating, dysuria, flank pain and hematuria.  Musculoskeletal: Negative for back pain, joint swelling and neck pain.  Neurological: Negative for dizziness, speech difficulty, light-headedness and numbness.  See HPI. All other review of systems negative.     Objective:    Physical Exam  BP 123/77 (BP Location:  Right Arm, Patient Position: Sitting, Cuff Size: Normal)   Pulse 98   Temp 98.5 F (36.9 C) (Oral)   Resp 17   Ht 5' 2.21" (1.58 m)   Wt 128 lb 3.2 oz (58.2 kg)   SpO2 98%   BMI 23.29 kg/m  Wt Readings from Last 3 Encounters:  09/11/18 128 lb 3.2 oz (58.2 kg)  07/23/18 126 lb (57.2 kg)  06/12/18 130 lb 9.6 oz (59.2 kg)   Physical Exam  Constitutional: She is  oriented to person, place, and time. She appears well-developed and well-nourished.  HENT:  Head: Normocephalic and atraumatic.  Eyes: Conjunctivae and EOM are normal.  Cardiovascular: Normal rate, regular rhythm and normal heart sounds.   Pulmonary/Chest: Effort normal and breath sounds normal. No respiratory distress. She has no wheezes.  Abdominal: Normal appearance and bowel sounds are normal. There is no tenderness. There is no CVA tenderness. no palpable masses  Neurological: She is alert and oriented to person, place, and time.     Health Maintenance Due  Topic Date Due  . TETANUS/TDAP  10/17/1967  . MAMMOGRAM  08/03/2018    There are no preventive care reminders to display for this patient.  Lab Results  Component Value Date   TSH 6.870 (H) 07/23/2018   Lab Results  Component Value Date   WBC 5.9 12/30/2017   HGB 11.7 12/30/2017   HCT 35.7 12/30/2017   MCV 92 12/30/2017   PLT 344 12/30/2017   Lab Results  Component Value Date   NA 133 (L) 07/23/2018   K 4.7 07/23/2018   CO2 22 07/23/2018   GLUCOSE 104 (H) 07/23/2018   BUN 24 07/23/2018   CREATININE 1.00 07/23/2018   BILITOT 0.3 07/23/2018   ALKPHOS 65 07/23/2018   AST 18 07/23/2018   ALT 14 07/23/2018   PROT 6.8 07/23/2018   ALBUMIN 4.4 07/23/2018   CALCIUM 9.5 07/23/2018   Lab Results  Component Value Date   CHOL 196 07/23/2018   Lab Results  Component Value Date   HDL 61 07/23/2018   Lab Results  Component Value Date   LDLCALC 105 (H) 07/23/2018   Lab Results  Component Value Date   TRIG 149 07/23/2018   Lab Results    Component Value Date   CHOLHDL 3.2 07/23/2018   Lab Results  Component Value Date   HGBA1C 6.0 (A) 07/23/2018      Assessment & Plan:   Problem List Items Addressed This Visit      Cardiovascular and Mediastinum   HTN (hypertension) - Primary   -   Stop olmesartan for a month and will reevaluate in one month If bp hold steady will have pt remain off olmesartan long term   Relevant Orders   Comprehensive metabolic panel     Other   Abnormal thyroid blood test  - will recheck and if TSH is uptrending will treat with levothyroxine   Relevant Orders   TSH + free T4    Other Visit Diagnoses    Hyponatremia    -  Advised pt to limit drinking too much free water    Relevant Orders   Comprehensive metabolic panel   Medication monitoring encounter          No orders of the defined types were placed in this encounter.   Follow-up: Return in about 3 months (around 12/11/2018) for diarrhea follow up.    Forrest Moron, MD

## 2018-09-12 LAB — COMPREHENSIVE METABOLIC PANEL
ALBUMIN: 4.3 g/dL (ref 3.6–4.8)
ALT: 13 IU/L (ref 0–32)
AST: 18 IU/L (ref 0–40)
Albumin/Globulin Ratio: 1.7 (ref 1.2–2.2)
Alkaline Phosphatase: 65 IU/L (ref 39–117)
BUN / CREAT RATIO: 27 (ref 12–28)
BUN: 23 mg/dL (ref 8–27)
CALCIUM: 9.6 mg/dL (ref 8.7–10.3)
CO2: 23 mmol/L (ref 20–29)
Chloride: 101 mmol/L (ref 96–106)
Creatinine, Ser: 0.84 mg/dL (ref 0.57–1.00)
GFR calc Af Amer: 82 mL/min/{1.73_m2} (ref >59)
GFR, EST NON AFRICAN AMERICAN: 71 mL/min/{1.73_m2} (ref >59)
GLUCOSE: 86 mg/dL (ref 65–99)
Globulin, Total: 2.6 g/dL (ref 1.5–4.5)
Potassium: 4.9 mmol/L (ref 3.5–5.2)
Sodium: 139 mmol/L (ref 134–144)
TOTAL PROTEIN: 6.9 g/dL (ref 6.0–8.5)

## 2018-09-12 LAB — TSH+FREE T4
FREE T4: 1.02 ng/dL (ref 0.82–1.77)
TSH: 3.73 u[IU]/mL (ref 0.450–4.500)

## 2018-09-13 DIAGNOSIS — M25561 Pain in right knee: Secondary | ICD-10-CM | POA: Diagnosis not present

## 2018-09-13 DIAGNOSIS — M1711 Unilateral primary osteoarthritis, right knee: Secondary | ICD-10-CM | POA: Diagnosis not present

## 2018-09-19 DIAGNOSIS — N950 Postmenopausal bleeding: Secondary | ICD-10-CM | POA: Diagnosis not present

## 2018-09-21 ENCOUNTER — Encounter: Payer: Self-pay | Admitting: Family Medicine

## 2018-11-08 DIAGNOSIS — N950 Postmenopausal bleeding: Secondary | ICD-10-CM | POA: Diagnosis not present

## 2019-01-24 ENCOUNTER — Encounter: Payer: Self-pay | Admitting: Family Medicine

## 2019-01-25 ENCOUNTER — Other Ambulatory Visit: Payer: Self-pay

## 2019-01-25 DIAGNOSIS — I10 Essential (primary) hypertension: Secondary | ICD-10-CM

## 2019-01-25 DIAGNOSIS — E785 Hyperlipidemia, unspecified: Secondary | ICD-10-CM

## 2019-01-25 MED ORDER — PITAVASTATIN CALCIUM 2 MG PO TABS: 1.0000 mg | ORAL_TABLET | Freq: Every day | ORAL | 1 refills | Status: DC

## 2019-01-25 MED ORDER — SPIRONOLACTONE 50 MG PO TABS: 50.0000 mg | ORAL_TABLET | Freq: Every day | ORAL | 1 refills | Status: DC

## 2019-01-25 MED ORDER — OLMESARTAN MEDOXOMIL 40 MG PO TABS: 40.0000 mg | ORAL_TABLET | Freq: Every day | ORAL | 1 refills | Status: DC

## 2019-01-31 ENCOUNTER — Ambulatory Visit: Payer: PPO | Admitting: Family Medicine

## 2019-02-19 ENCOUNTER — Telehealth: Payer: Self-pay | Admitting: *Deleted

## 2019-02-19 NOTE — Telephone Encounter (Signed)
Schedule AWV.  

## 2019-02-20 NOTE — Telephone Encounter (Signed)
Pt calling back and would like to schedule an appointment.

## 2019-02-25 ENCOUNTER — Ambulatory Visit (INDEPENDENT_AMBULATORY_CARE_PROVIDER_SITE_OTHER): Payer: PPO | Admitting: Family Medicine

## 2019-02-25 ENCOUNTER — Other Ambulatory Visit: Payer: Self-pay

## 2019-02-25 VITALS — BP 123/77 | Ht 62.21 in | Wt 134.0 lb

## 2019-02-25 DIAGNOSIS — Z0000 Encounter for general adult medical examination without abnormal findings: Secondary | ICD-10-CM | POA: Diagnosis not present

## 2019-02-25 NOTE — Progress Notes (Signed)
Presents today for [employer] Visit   Date of last exam: 09/11/2018  Interpreter used for this visit? No  I connected with  Amanda Curry on 02/25/19 by a  telephoen application and verified that I am speaking with the correct person using two identifiers.  .   Patient Care Team: Shawnee Knapp, MD as PCP - General (Family Medicine) [name], MD as Consulting Physician (Obstetrics and Gynecology) Pedro Earls, MD as Attending Physician (Family Medicine)   Other items to address today:  Discussed immunizations TDAP declined due to insurance Discussed Eye/Dental Exams    Other Screening:  Last lipid screening:07/23/2018   ADVANCE DIRECTIVES: Discussed:yes On File: no Materials Provided: no (copy requested)   Cardiac Risk Factors include: advanced age (>20men, >40 women);hypertension;dyslipidemia  Immunization status:  Immunization History  Administered Date(s) Administered  . Influenza, High Dose Seasonal PF 06/18/2018  . Influenza,inj,Quad PF,6+ Mos 06/19/2013  . Influenza-Unspecified 06/15/2015, 06/15/2016, 07/04/2017  . Pneumococcal Conjugate-13 09/08/2015  . Pneumococcal Polysaccharide-23 11/02/2013  . Zoster 06/28/2016  . Zoster Recombinat (Shingrix) 06/02/2018     Health Maintenance Due  Topic Date Due  . TETANUS/TDAP  10/17/1967  . MAMMOGRAM  08/03/2018     Functional Status Survey: Is the patient deaf or have difficulty hearing?: No Does the patient have difficulty seeing, even when wearing glasses/contacts?: No Does the patient have difficulty concentrating, remembering, or making decisions?: No Does the patient have difficulty walking or climbing stairs?: No Does the patient have difficulty dressing or bathing?: No Does the patient have difficulty doing errands alone such as visiting a doctor's office or shopping?: No   6CIT Screen 02/25/2019 05/05/2017  What Year? 0 points 0 points  What month? 0 points 0 points  What  time? 0 points 0 points  Count back from 20 0 points 0 points  Months in reverse 0 points 0 points  Repeat phrase 0 points 0 points  Total Score 0 0        Clinical Support from 02/25/2019 in [facility]  AUDIT-C Score  0       Home Environment:   Lives in 1 1/2 story home  No trouble climbing stairs Yes scattered rugs  (have grips for no slip) No Grab bars Adequate lighting   Patient Active Problem List   Diagnosis Date Noted  . Weight loss, unintentional 07/28/2018  . Prediabetes 01/26/2018  . Abnormal thyroid blood test 01/26/2018  . Asymptomatic microscopic hematuria 01/26/2018  . Female cystocele 01/26/2018  . HTN (hypertension) 03/21/2013  . Hyperlipidemia 03/21/2013     Past Medical History:  Diagnosis Date  . Arthritis   . Diverticulosis   . Ganglion of left wrist    thumb joint  . Hyperlipidemia   . Hypertension   . Tinnitus      Past Surgical History:  Procedure Laterality Date  . TONSILLECTOMY    . TUBAL LIGATION       Family History  Problem Relation Age of Onset  . Colon cancer Maternal Grandmother   . Cancer Maternal Grandmother        colon  . Prostate cancer Father   . Cancer Father        prostate  . Alzheimer's disease Mother   . Cancer Mother   . Hyperlipidemia Mother   . Hypertension Mother   . Heart disease Paternal Grandmother   . Heart disease Paternal Grandfather   . Hypertension Sister  Social History   Socioeconomic History  . Marital status: Married    Spouse name: [name]  . Number of children: 4  . Years of education: Not on file  . Highest education level: Not on file  Occupational History  . 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 Financial resource strain: Not on file  . Food insecurity    Worry: Not on file    Inability: Not on file  . Transportation needs    Medical: Not on file    Non-medical: Not on file  Tobacco Use  . Smoking status:  Never Smoker  . Smokeless tobacco: Never Used  Substance and Sexual Activity  . Alcohol use: Yes    Comment: rarely; 1-2x/mo  . Drug use: No  . Sexual activity: Yes    Birth control/protection: Post-menopausal, Surgical  Lifestyle  . Physical activity    Days per week: Not on file    Minutes per session: Not on file  . Stress: Not on file  Relationships  . Social Herbalist on phone: Not on file    Gets together: Not on file    Attends religious service: Not on file    Active member of club or organization: Not on file    Attends meetings of clubs or organizations: Not on file    Relationship status: Not on file  . Intimate partner violence    Fear of current or ex partner: Not on file    Emotionally abused: Not on file    Physically abused: Not on file    Forced sexual activity: Not on file  Other Topics Concern  . Not on file  Social History Narrative   Lives with her husband.   Adult children. Two live in [place].     Allergies  Allergen Reactions  . Hctz [Hydrochlorothiazide] Other (See Comments)    Causes extreme hyponatremia quickly - 126  . Amlodipine Swelling    Mild but chronic lower extremity edema     Prior to Admission medications   Medication Sig Start Date End Date Taking? Authorizing Provider  estradiol (ESTRACE) 0.1 MG/GM vaginal cream INSERT 1 GRAM  INTRAVAGINALLY 1 TO 2  TIMES A WEEK 07/02/18  Yes [provider]  glucosamine-chondroitin 500-400 MG tablet Take 1 tablet by mouth 3 (three) times daily.   Yes [provider]  Melatonin 3 MG TABS Take 1 tablet by mouth at bedtime as needed (sleep).   Yes [provider]  olmesartan (BENICAR) 40 MG tablet Take 1 tablet (40 mg total) by mouth daily. 01/25/19  Yes Forrest Moron, MD  Pitavastatin Calcium (LIVALO) 2 MG TABS Take 0.5 tablets (1 mg total) by mouth daily. 01/25/19  Yes Forrest Moron, MD  spironolactone (ALDACTONE) 50 MG tablet Take 1 tablet (50 mg  total) by mouth daily. 01/25/19  Yes Stallings, Zoe A, MD  Calcium Carbonate (CALCIUM 600 PO) Take by mouth 2 (two) times daily.    [provider]  Calcium Carbonate-Vitamin D 600-400 MG-UNIT tablet Take by mouth.    [provider]  loperamide (IMODIUM A-D) 2 MG tablet Take 2 mg by mouth 4 (four) times daily as needed for diarrhea or loose stools.    [provider]  Multiple Vitamin (MULTIVITAMIN WITH MINERALS) TABS Take 1 tablet by mouth daily.    [provider]     Depression screen [facility] 2/9 02/25/2019 09/11/2018 07/23/2018 02/14/2018 01/25/2018  Decreased  Interest 0 0 0 0 0  Down, Depressed, Hopeless 0 0 0 0 0  PHQ - 2 Score 0 0 0 0 0     Fall Risk  02/25/2019 09/11/2018 07/23/2018 02/14/2018 01/25/2018  Falls in the past year? 0 0 0 No No  Number falls in past yr: 0 - 0 - -  Injury with Fall? 0 - 0 - -      PHYSICAL EXAM: BP 123/77 Comment: taken from last visit  Ht 5' 2.21" (1.58 m)   Wt 134 lb (60.8 kg) Comment: per patient  BMI 24.34 kg/m    Wt Readings from Last 3 Encounters:  02/25/19 134 lb (60.8 kg)  09/11/18 128 lb 3.2 oz (58.2 kg)  07/23/18 126 lb (57.2 kg)     No exam data present    Physical Exam   Education/Counseling provided regarding diet and exercise, prevention of chronic diseases, smoking/tobacco cessation, if applicable, and reviewed "Covered Medicare Preventive Services."

## 2019-02-25 NOTE — Patient Instructions (Addendum)
Thank you for taking time to come for your Medicare Wellness Visit. I appreciate your ongoing commitment to your health goals. Please review the following plan we discussed and let me know if I can assist you in the future.  Julie Greer LPN   Preventive Care 65 Years and Older, Female Preventive care refers to lifestyle choices and visits with your health care provider that can promote health and wellness. This includes:  A yearly physical exam. This is also called an annual well check.  Regular dental and eye exams.  Immunizations.  Screening for certain conditions.  Healthy lifestyle choices, such as diet and exercise. What can I expect for my preventive care visit? Physical exam Your health care provider will check:  Height and weight. These may be used to calculate body mass index (BMI), which is a measurement that tells if you are at a healthy weight.  Heart rate and blood pressure.  Your skin for abnormal spots. Counseling Your health care provider may ask you questions about:  Alcohol, tobacco, and drug use.  Emotional well-being.  Home and relationship well-being.  Sexual activity.  Eating habits.  History of falls.  Memory and ability to understand (cognition).  Work and work environment.  Pregnancy and menstrual history. What immunizations do I need?  Influenza (flu) vaccine  This is recommended every year. Tetanus, diphtheria, and pertussis (Tdap) vaccine  You may need a Td booster every 10 years. Varicella (chickenpox) vaccine  You may need this vaccine if you have not already been vaccinated. Zoster (shingles) vaccine  You may need this after age 60. Pneumococcal conjugate (PCV13) vaccine  One dose is recommended after age 65. Pneumococcal polysaccharide (PPSV23) vaccine  One dose is recommended after age 65. Measles, mumps, and rubella (MMR) vaccine  You may need at least one dose of MMR if you were born in 1957 or later. You may also  need a second dose. Meningococcal conjugate (MenACWY) vaccine  You may need this if you have certain conditions. Hepatitis A vaccine  You may need this if you have certain conditions or if you travel or work in places where you may be exposed to hepatitis A. Hepatitis B vaccine  You may need this if you have certain conditions or if you travel or work in places where you may be exposed to hepatitis B. Haemophilus influenzae type b (Hib) vaccine  You may need this if you have certain conditions. You may receive vaccines as individual doses or as more than one vaccine together in one shot (combination vaccines). Talk with your health care provider about the risks and benefits of combination vaccines. What tests do I need? Blood tests  Lipid and cholesterol levels. These may be checked every 5 years, or more frequently depending on your overall health.  Hepatitis C test.  Hepatitis B test. Screening  Lung cancer screening. You may have this screening every year starting at age 55 if you have a 30-pack-year history of smoking and currently smoke or have quit within the past 15 years.  Colorectal cancer screening. All adults should have this screening starting at age 50 and continuing until age 75. Your health care provider may recommend screening at age 45 if you are at increased risk. You will have tests every 1-10 years, depending on your results and the type of screening test.  Diabetes screening. This is done by checking your blood sugar (glucose) after you have not eaten for a while (fasting). You may have this done every   1-3 years.  Mammogram. This may be done every 1-2 years. Talk with your health care provider about how often you should have regular mammograms.  BRCA-related cancer screening. This may be done if you have a family history of breast, ovarian, tubal, or peritoneal cancers. Other tests  Sexually transmitted disease (STD) testing.  Bone density scan. This is done  to screen for osteoporosis. You may have this done starting at age 91. Follow these instructions at home: Eating and drinking  Eat a diet that includes fresh fruits and vegetables, whole grains, lean protein, and low-fat dairy products. Limit your intake of foods with high amounts of sugar, saturated fats, and salt.  Take vitamin and mineral supplements as recommended by your health care provider.  Do not drink alcohol if your health care provider tells you not to drink.  If you drink alcohol: ? Limit how much you have to 0-1 drink a day. ? Be aware of how much alcohol is in your drink. In the [place] one drink equals one 12 oz bottle of beer (355 mL), one 5 oz glass of wine (148 mL), or one 1 oz glass of hard liquor (44 mL). Lifestyle  Take daily care of your teeth and gums.  Stay active. Exercise for at least 30 minutes on 5 or more days each week.  Do not use any products that contain nicotine or tobacco, such as cigarettes, e-cigarettes, and chewing tobacco. If you need help quitting, ask your health care provider.  If you are sexually active, practice safe sex. Use a condom or other form of protection in order to prevent STIs (sexually transmitted infections).  Talk with your health care provider about taking a low-dose aspirin or statin. What's next?  Go to your health care provider once a year for a well check visit.  Ask your health care provider how often you should have your eyes and teeth checked.  Stay up to date on all vaccines. This information is not intended to replace advice given to you by your health care provider. Make sure you discuss any questions you have with your health care provider. Document Released: 09/11/2015 Document Revised: 08/09/2018 Document Reviewed: 08/09/2018 Elsevier Patient Education  2020 [employer].

## 2019-03-13 DIAGNOSIS — N8182 Incompetence or weakening of pubocervical tissue: Secondary | ICD-10-CM | POA: Diagnosis not present

## 2019-04-03 DIAGNOSIS — M25561 Pain in right knee: Secondary | ICD-10-CM | POA: Diagnosis not present

## 2019-04-03 DIAGNOSIS — M1711 Unilateral primary osteoarthritis, right knee: Secondary | ICD-10-CM | POA: Diagnosis not present

## 2019-05-17 DIAGNOSIS — M25561 Pain in right knee: Secondary | ICD-10-CM | POA: Diagnosis not present

## 2019-05-17 DIAGNOSIS — M1711 Unilateral primary osteoarthritis, right knee: Secondary | ICD-10-CM | POA: Diagnosis not present

## 2019-05-22 ENCOUNTER — Encounter: Payer: Self-pay | Admitting: Family Medicine

## 2019-05-30 HISTORY — PX: JOINT REPLACEMENT: SHX530

## 2019-06-05 DIAGNOSIS — N8182 Incompetence or weakening of pubocervical tissue: Secondary | ICD-10-CM | POA: Diagnosis not present

## 2019-06-11 ENCOUNTER — Encounter: Payer: Self-pay | Admitting: Family Medicine

## 2019-06-11 ENCOUNTER — Other Ambulatory Visit: Payer: Self-pay

## 2019-06-11 ENCOUNTER — Ambulatory Visit (INDEPENDENT_AMBULATORY_CARE_PROVIDER_SITE_OTHER): Payer: PPO | Admitting: Family Medicine

## 2019-06-11 VITALS — BP 140/78 | HR 97 | Temp 98.7°F | Wt 131.4 lb

## 2019-06-11 DIAGNOSIS — Z01818 Encounter for other preprocedural examination: Secondary | ICD-10-CM

## 2019-06-11 LAB — CBC
Hematocrit: 34.5 % (ref 34.0–46.6)
Hemoglobin: 11.1 g/dL (ref 11.1–15.9)
MCH: 29.4 pg (ref 26.6–33.0)
MCHC: 32.2 g/dL (ref 31.5–35.7)
MCV: 92 fL (ref 79–97)
Platelets: 405 10*3/uL (ref 150–450)
RBC: 3.77 x10E6/uL (ref 3.77–5.28)
RDW: 13 % (ref 11.7–15.4)
WBC: 6 10*3/uL (ref 3.4–10.8)

## 2019-06-11 NOTE — Patient Instructions (Signed)
° ° ° °  If you have lab work done today you will be contacted with your lab results within the next 2 weeks.  If you have not heard from us then please contact us. The fastest way to get your results is to register for My Chart. ° ° °IF you received an x-ray today, you will receive an invoice from [facility]. Please contact [facility] at [phone] with questions or concerns regarding your invoice.  ° °IF you received labwork today, you will receive an invoice from [facility]. Please contact [facility] at [phone] with questions or concerns regarding your invoice.  ° °Our billing staff will not be able to assist you with questions regarding bills from these companies. ° °You will be contacted with the lab results as soon as they are available. The fastest way to get your results is to activate your My Chart account. Instructions are located on the last page of this paperwork. If you have not heard from us regarding the results in 2 weeks, please contact this office. °  ° ° ° °

## 2019-06-11 NOTE — Progress Notes (Signed)
Established Patient Office Visit  Subjective:  Patient ID: Amanda Curry, female    DOB: 1951-04-15  Age: 68 y.o. MRN: [id number]  CC:  Chief Complaint  Patient presents with  . surgical clearance    Patient is here today for surgical clearance for a right knee replacement    HPI [name] presents for   Patient here for preop clearance for knee surgery    Past Medical History:  Diagnosis Date  . Arthritis   . Diverticulosis   . Ganglion of left wrist    thumb joint  . Hyperlipidemia   . Hypertension   . Tinnitus     Past Surgical History:  Procedure Laterality Date  . TONSILLECTOMY    . TUBAL LIGATION      Family History  Problem Relation Age of Onset  . Colon cancer Maternal Grandmother   . Cancer Maternal Grandmother        colon  . Prostate cancer Father   . Cancer Father        prostate  . Alzheimer's disease Mother   . Cancer Mother   . Hyperlipidemia Mother   . Hypertension Mother   . Heart disease Paternal Grandmother   . Heart disease Paternal Grandfather   . Hypertension Sister     Social History   Socioeconomic History  . Marital status: Married    Spouse name: [name]  . Number of children: 4  . Years of education: Not on file  . Highest education level: Not on file  Occupational History  . 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 Financial resource strain: Not on file  . Food insecurity    Worry: Not on file    Inability: Not on file  . Transportation needs    Medical: Not on file    Non-medical: Not on file  Tobacco Use  . Smoking status: Never Smoker  . Smokeless tobacco: Never Used  Substance and Sexual Activity  . Alcohol use: Yes    Comment: rarely; 1-2x/mo  . Drug use: No  . Sexual activity: Yes    Birth control/protection: Post-menopausal, Surgical  Lifestyle  . Physical activity    Days per week: Not on file    Minutes per session: Not on file  . Stress: Not on file   Relationships  . Social Herbalist on phone: Not on file    Gets together: Not on file    Attends religious service: Not on file    Active member of club or organization: Not on file    Attends meetings of clubs or organizations: Not on file    Relationship status: Not on file  . Intimate partner violence    Fear of current or ex partner: Not on file    Emotionally abused: Not on file    Physically abused: Not on file    Forced sexual activity: Not on file  Other Topics Concern  . Not on file  Social History Narrative   Lives with her husband.   Adult children. Two live in [place].    Outpatient Medications Prior to Visit  Medication Sig Dispense Refill  . estradiol (ESTRACE) 0.1 MG/GM vaginal cream INSERT 1 GRAM  INTRAVAGINALLY 1 TO 2  TIMES A WEEK  0  . loperamide (IMODIUM A-D) 2 MG tablet Take 2 mg by mouth 4 (four) times daily as needed for diarrhea or loose stools.    Marland Kitchen  Melatonin 3 MG TABS Take 1 tablet by mouth at bedtime as needed (sleep).    . olmesartan (BENICAR) 40 MG tablet Take 1 tablet (40 mg total) by mouth daily. 90 tablet 1  . Pitavastatin Calcium (LIVALO) 2 MG TABS Take 0.5 tablets (1 mg total) by mouth daily. 45 tablet 1  . spironolactone (ALDACTONE) 50 MG tablet Take 1 tablet (50 mg total) by mouth daily. 90 tablet 1  . Calcium Carbonate (CALCIUM 600 PO) Take by mouth 2 (two) times daily.    . Calcium Carbonate-Vitamin D 600-400 MG-UNIT tablet Take by mouth.    Marland Kitchen glucosamine-chondroitin 500-400 MG tablet Take 1 tablet by mouth 3 (three) times daily.    . Multiple Vitamin (MULTIVITAMIN WITH MINERALS) TABS Take 1 tablet by mouth daily.     No facility-administered medications prior to visit.     Allergies  Allergen Reactions  . Hctz [Hydrochlorothiazide] Other (See Comments)    Causes extreme hyponatremia quickly - 126  . Amlodipine Swelling    Mild but chronic lower extremity edema    ROS Review of Systems Review of Systems   Constitutional: Negative for activity change, appetite change, chills and fever.  HENT: Negative for congestion, nosebleeds, trouble swallowing and voice change.   Respiratory: Negative for cough, shortness of breath and wheezing.   Gastrointestinal: Negative for diarrhea, nausea and vomiting.  Genitourinary: Negative for difficulty urinating, dysuria, flank pain and hematuria.  Musculoskeletal: Negative for back pain, joint swelling and neck pain.  Neurological: Negative for dizziness, speech difficulty, light-headedness and numbness.  See HPI. All other review of systems negative.     Objective:     BP 140/78 (BP Location: Right Arm, Patient Position: Sitting, Cuff Size: Normal)   Pulse 97   Temp 98.7 F (37.1 C) (Oral)   Wt 131 lb 6.4 oz (59.6 kg)   SpO2 98%   BMI 23.87 kg/m  Wt Readings from Last 3 Encounters:  06/11/19 131 lb 6.4 oz (59.6 kg)  02/25/19 134 lb (60.8 kg)  09/11/18 128 lb 3.2 oz (58.2 kg)   Physical Exam Constitutional:      Appearance: Normal appearance.     Comments: Thin female  HENT:     Head: Normocephalic and atraumatic.     Right Ear: Tympanic membrane normal.     Left Ear: Tympanic membrane normal.     Nose: Nose normal. No congestion.  Eyes:     Extraocular Movements: Extraocular movements intact.     Conjunctiva/sclera: Conjunctivae normal.  Neck:     Musculoskeletal: Normal range of motion and neck supple.  Cardiovascular:     Rate and Rhythm: Normal rate and regular rhythm.     Pulses: Normal pulses.     Heart sounds: Normal heart sounds. No murmur.  Pulmonary:     Effort: Pulmonary effort is normal. No respiratory distress.     Breath sounds: Normal breath sounds. No stridor. No wheezing, rhonchi or rales.  Chest:     Chest wall: No tenderness.  Abdominal:     General: Abdomen is flat. Bowel sounds are normal. There is no distension.     Palpations: Abdomen is soft. There is no mass.     Tenderness: There is no abdominal tenderness.  There is no right CVA tenderness, left CVA tenderness, guarding or rebound.     Hernia: No hernia is present.  Musculoskeletal: Normal range of motion.        General: No swelling or tenderness.  Skin:  General: Skin is warm.     Capillary Refill: Capillary refill takes less than 2 seconds.  Neurological:     General: No focal deficit present.     Mental Status: She is alert and oriented to person, place, and time.  Psychiatric:        Mood and Affect: Mood normal.        Behavior: Behavior normal.        Thought Content: Thought content normal.        Judgment: Judgment normal.      Health Maintenance Due  Topic Date Due  . MAMMOGRAM  08/03/2018    There are no preventive care reminders to display for this patient.  Lab Results  Component Value Date   TSH 3.730 09/11/2018   Lab Results  Component Value Date   WBC 5.9 12/30/2017   HGB 11.7 12/30/2017   HCT 35.7 12/30/2017   MCV 92 12/30/2017   PLT 344 12/30/2017   Lab Results  Component Value Date   NA 139 09/11/2018   K 4.9 09/11/2018   CO2 23 09/11/2018   GLUCOSE 86 09/11/2018   BUN 23 09/11/2018   CREATININE 0.84 09/11/2018   BILITOT <0.2 09/11/2018   ALKPHOS 65 09/11/2018   AST 18 09/11/2018   ALT 13 09/11/2018   PROT 6.9 09/11/2018   ALBUMIN 4.3 09/11/2018   CALCIUM 9.6 09/11/2018   Lab Results  Component Value Date   CHOL 196 07/23/2018   Lab Results  Component Value Date   HDL 61 07/23/2018   Lab Results  Component Value Date   LDLCALC 105 (H) 07/23/2018   Lab Results  Component Value Date   TRIG 149 07/23/2018   Lab Results  Component Value Date   CHOLHDL 3.2 07/23/2018   Lab Results  Component Value Date   HGBA1C 6.0 (A) 07/23/2018      Assessment & Plan:   Problem List Items Addressed This Visit    None    Visit Diagnoses    Preoperative clearance    -  Primary   Relevant Orders   CMP14+EGFR   CBC   POCT urinalysis dipstick   EKG 12-Lead (Completed)    cleared  for surgery  No orders of the defined types were placed in this encounter.   Follow-up: No follow-ups on file.    Forrest Moron, MD

## 2019-06-12 ENCOUNTER — Telehealth: Payer: Self-pay | Admitting: Family Medicine

## 2019-06-12 ENCOUNTER — Telehealth: Payer: Self-pay

## 2019-06-12 LAB — CMP14+EGFR
ALT: 18 IU/L (ref 0–32)
AST: 23 IU/L (ref 0–40)
Albumin/Globulin Ratio: 2.4 — ABNORMAL HIGH (ref 1.2–2.2)
Albumin: 4.5 g/dL (ref 3.8–4.8)
Alkaline Phosphatase: 82 IU/L (ref 39–117)
BUN/Creatinine Ratio: 22 (ref 12–28)
BUN: 21 mg/dL (ref 8–27)
Bilirubin Total: 0.2 mg/dL (ref 0.0–1.2)
CO2: 19 mmol/L — ABNORMAL LOW (ref 20–29)
Calcium: 9.4 mg/dL (ref 8.7–10.3)
Chloride: 96 mmol/L (ref 96–106)
Creatinine, Ser: 0.97 mg/dL (ref 0.57–1.00)
GFR calc Af Amer: 68 mL/min/{1.73_m2} (ref >59)
GFR calc non Af Amer: 59 mL/min/{1.73_m2} — ABNORMAL LOW (ref >59)
Globulin, Total: 1.9 g/dL (ref 1.5–4.5)
Glucose: 90 mg/dL (ref 65–99)
Potassium: 4.7 mmol/L (ref 3.5–5.2)
Sodium: 128 mmol/L — ABNORMAL LOW (ref 134–144)
Total Protein: 6.4 g/dL (ref 6.0–8.5)

## 2019-06-12 NOTE — Telephone Encounter (Signed)
Called and informed pt we still have not received form, she verbalized understanding and states she will give them a call to fax over form.

## 2019-06-12 NOTE — Telephone Encounter (Signed)
Pt surgical clearance form faxed to [facility]

## 2019-06-12 NOTE — Telephone Encounter (Signed)
Pt is calling to make sure we received fax from [facility] from Dr. Wynelle Link . Please advise

## 2019-06-13 NOTE — Telephone Encounter (Signed)
Paper work already has been faxed at this time.

## 2019-06-17 DIAGNOSIS — N8182 Incompetence or weakening of pubocervical tissue: Secondary | ICD-10-CM | POA: Diagnosis not present

## 2019-06-17 NOTE — Patient Instructions (Addendum)
DUE TO COVID-19 ONLY ONE VISITOR IS ALLOWED TO COME WITH YOU AND STAY IN THE WAITING ROOM ONLY DURING PRE OP AND PROCEDURE DAY OF SURGERY. THE 1 VISITOR MAY VISIT WITH YOU AFTER SURGERY IN YOUR PRIVATE ROOM DURING VISITING HOURS ONLY!  YOU NEED TO HAVE A COVID 19 TEST ON___Thursday 10/22/2020____ @___210  pm____, THIS TEST MUST BE DONE BEFORE SURGERY, COME  [address] [place] , [id number].  ([place]) ONCE YOUR COVID TEST IS COMPLETED, PLEASE BEGIN THE QUARANTINE INSTRUCTIONS AS OUTLINED IN YOUR HANDOUT.                Amanda Curry    Your procedure is scheduled on: Monday 06/24/2019   Report to [facility] Main  Entrance    Report to admitting at   1005 AM     Call this number if you have problems the morning of surgery [phone]    Remember: Do not eat food  :After Midnight.    NO SOLID FOOD AFTER MIDNIGHT THE NIGHT PRIOR TO SURGERY. NOTHING BY MOUTH EXCEPT CLEAR LIQUIDS UNTIL 0935 am .     PLEASE FINISH Gatorade zero  DRINK PER SURGEON ORDER  WHICH NEEDS TO BE COMPLETED AT  0935 am.   CLEAR LIQUID DIET   Foods Allowed                                                                     Foods Excluded  Coffee and tea, regular and decaf                             liquids that you cannot  Plain Jell-O any favor except red or purple                                           see through such as: Fruit ices (not with fruit pulp)                                     milk, soups, orange juice  Iced Popsicles                                    All solid food Carbonated beverages, regular and diet                                    Cranberry, grape and apple juices Sports drinks like Gatorade Lightly seasoned clear broth or consume(fat free) Sugar, honey syrup  Sample Menu Breakfast                                Lunch  Supper Cranberry juice                    Beef broth                            Chicken  broth Jell-O                                     Grape juice                           Apple juice Coffee or tea                        Jell-O                                      Popsicle                                                Coffee or tea                        Coffee or tea  _____________________________________________________________________       BRUSH YOUR TEETH MORNING OF SURGERY AND RINSE YOUR MOUTH OUT, NO CHEWING GUM CANDY OR MINTS.     Take these medicines the morning of surgery with A SIP OF WATER: Pitavastatin Calcium (Livalo)                                 You may not have any metal on your body including hair pins and              piercings  Do not wear jewelry, make-up, lotions, powders or perfumes, deodorant             Do not wear nail polish on your fingernails.  Do not shave  48 hours prior to surgery.               Do not bring valuables to the hospital. [place].  Contacts, dentures or bridgework may not be worn into surgery.  Leave suitcase in the car. After surgery it may be brought to your room.                  Please read over the following fact sheets you were given: _____________________________________________________________________             [facility] - Preparing for Surgery Before surgery, you can play an important role.  Because skin is not sterile, your skin needs to be as free of germs as possible.  You can reduce the number of germs on your skin by washing with CHG (chlorahexidine gluconate) soap before surgery.  CHG is an antiseptic cleaner which kills germs and bonds with the skin to continue killing germs even after washing. Please DO NOT use if you have an allergy to CHG or antibacterial soaps.  If your skin becomes reddened/irritated stop using the CHG and inform your nurse when you arrive at Short Stay. Do not shave (including legs and underarms) for at least 48 hours prior  to the first CHG shower.  You may shave your face/neck. Please follow these instructions carefully:  1.  Shower with CHG Soap the night before surgery and the  morning of Surgery.  2.  If you choose to wash your hair, wash your hair first as usual with your  normal  shampoo.  3.  After you shampoo, rinse your hair and body thoroughly to remove the  shampoo.                           4.  Use CHG as you would any other liquid soap.  You can apply chg directly  to the skin and wash                       Gently with a scrungie or clean washcloth.  5.  Apply the CHG Soap to your body ONLY FROM THE NECK DOWN.   Do not use on face/ open                           Wound or open sores. Avoid contact with eyes, ears mouth and genitals (private parts).                       Wash face,  Genitals (private parts) with your normal soap.             6.  Wash thoroughly, paying special attention to the area where your surgery  will be performed.  7.  Thoroughly rinse your body with warm water from the neck down.  8.  DO NOT shower/wash with your normal soap after using and rinsing off  the CHG Soap.                9.  Pat yourself dry with a clean towel.            10.  Wear clean pajamas.            11.  Place clean sheets on your bed the night of your first shower and do not  sleep with pets. Day of Surgery : Do not apply any lotions/deodorants the morning of surgery.  Please wear clean clothes to the hospital/surgery center.  FAILURE TO FOLLOW THESE INSTRUCTIONS MAY RESULT IN THE CANCELLATION OF YOUR SURGERY PATIENT SIGNATURE_________________________________  NURSE SIGNATURE__________________________________  ________________________________________________________________________  WHAT IS A BLOOD TRANSFUSION? Blood Transfusion Information  A transfusion is the replacement of blood or some of its parts. Blood is made up of multiple cells which provide different functions.  Red blood cells carry oxygen  and are used for blood loss replacement.  White blood cells fight against infection.  Platelets control bleeding.  Plasma helps clot blood.  Other blood products are available for specialized needs, such as hemophilia or other clotting disorders. BEFORE THE TRANSFUSION  Who gives blood for transfusions?   Healthy volunteers who are fully evaluated to make sure their blood is safe. This is blood bank blood. Transfusion therapy is the safest it has ever been in the practice of medicine. Before blood is taken from a donor, a complete history is taken to make sure that person has no history of diseases nor  engages in risky social behavior (examples are intravenous drug use or sexual activity with multiple partners). The donor's travel history is screened to minimize risk of transmitting infections, such as malaria. The donated blood is tested for signs of infectious diseases, such as HIV and hepatitis. The blood is then tested to be sure it is compatible with you in order to minimize the chance of a transfusion reaction. If you or a relative donates blood, this is often done in anticipation of surgery and is not appropriate for emergency situations. It takes many days to process the donated blood. RISKS AND COMPLICATIONS Although transfusion therapy is very safe and saves many lives, the main dangers of transfusion include:   Getting an infectious disease.  Developing a transfusion reaction. This is an allergic reaction to something in the blood you were given. Every precaution is taken to prevent this. The decision to have a blood transfusion has been considered carefully by your caregiver before blood is given. Blood is not given unless the benefits outweigh the risks. AFTER THE TRANSFUSION  Right after receiving a blood transfusion, you will usually feel much better and more energetic. This is especially true if your red blood cells have gotten low (anemic). The transfusion raises the level of  the red blood cells which carry oxygen, and this usually causes an energy increase.  The nurse administering the transfusion will monitor you carefully for complications. HOME CARE INSTRUCTIONS  No special instructions are needed after a transfusion. You may find your energy is better. Speak with your caregiver about any limitations on activity for underlying diseases you may have. SEEK MEDICAL CARE IF:   Your condition is not improving after your transfusion.  You develop redness or irritation at the intravenous (IV) site. SEEK IMMEDIATE MEDICAL CARE IF:  Any of the following symptoms occur over the next 12 hours:  Shaking chills.  You have a temperature by mouth above 102 F (38.9 C), not controlled by medicine.  Chest, back, or muscle pain.  People around you feel you are not acting correctly or are confused.  Shortness of breath or difficulty breathing.  Dizziness and fainting.  You get a rash or develop hives.  You have a decrease in urine output.  Your urine turns a dark color or changes to pink, red, or brown. Any of the following symptoms occur over the next 10 days:  You have a temperature by mouth above 102 F (38.9 C), not controlled by medicine.  Shortness of breath.  Weakness after normal activity.  The white part of the eye turns yellow (jaundice).  You have a decrease in the amount of urine or are urinating less often.  Your urine turns a dark color or changes to pink, red, or brown. Document Released: 08/12/2000 Document Revised: 11/07/2011 Document Reviewed: 03/31/2008 [facility] Patient Information 2014 [place], [place].  _______________________________________________________________________

## 2019-06-19 ENCOUNTER — Encounter (HOSPITAL_COMMUNITY): Payer: Self-pay

## 2019-06-19 ENCOUNTER — Other Ambulatory Visit: Payer: Self-pay

## 2019-06-19 ENCOUNTER — Encounter (HOSPITAL_COMMUNITY)
Admission: RE | Admit: 2019-06-19 | Discharge: 2019-06-19 | Disposition: A | Discharge status: Home / Self Care | Payer: PPO | Source: Ambulatory Visit | Attending: Orthopedic Surgery | Admitting: Orthopedic Surgery

## 2019-06-19 DIAGNOSIS — Z79899 Other long term (current) drug therapy: Secondary | ICD-10-CM | POA: Insufficient documentation

## 2019-06-19 DIAGNOSIS — I10 Essential (primary) hypertension: Secondary | ICD-10-CM | POA: Insufficient documentation

## 2019-06-19 DIAGNOSIS — M1711 Unilateral primary osteoarthritis, right knee: Secondary | ICD-10-CM | POA: Diagnosis not present

## 2019-06-19 DIAGNOSIS — M1990 Unspecified osteoarthritis, unspecified site: Secondary | ICD-10-CM | POA: Diagnosis not present

## 2019-06-19 DIAGNOSIS — E785 Hyperlipidemia, unspecified: Secondary | ICD-10-CM | POA: Insufficient documentation

## 2019-06-19 DIAGNOSIS — R7303 Prediabetes: Secondary | ICD-10-CM | POA: Insufficient documentation

## 2019-06-19 DIAGNOSIS — Z791 Long term (current) use of non-steroidal anti-inflammatories (NSAID): Secondary | ICD-10-CM | POA: Diagnosis not present

## 2019-06-19 DIAGNOSIS — Z01812 Encounter for preprocedural laboratory examination: Secondary | ICD-10-CM | POA: Insufficient documentation

## 2019-06-19 HISTORY — DX: Prediabetes: R73.03

## 2019-06-19 LAB — CBC WITH DIFFERENTIAL/PLATELET
Abs Immature Granulocytes: 0.02 10*3/uL (ref 0.00–0.07)
Basophils Absolute: 0 10*3/uL (ref 0.0–0.1)
Basophils Relative: 0 %
Eosinophils Absolute: 0 10*3/uL (ref 0.0–0.5)
Eosinophils Relative: 1 %
HCT: 36 % (ref 36.0–46.0)
Hemoglobin: 11.9 g/dL — ABNORMAL LOW (ref 12.0–15.0)
Immature Granulocytes: 0 %
Lymphocytes Relative: 17 %
Lymphs Abs: 1.2 10*3/uL (ref 0.7–4.0)
MCH: 30.7 pg (ref 26.0–34.0)
MCHC: 33.1 g/dL (ref 30.0–36.0)
MCV: 93 fL (ref 80.0–100.0)
Monocytes Absolute: 0.4 10*3/uL (ref 0.1–1.0)
Monocytes Relative: 6 %
Neutro Abs: 5.4 10*3/uL (ref 1.7–7.7)
Neutrophils Relative %: 76 %
Platelets: 350 10*3/uL (ref 150–400)
RBC: 3.87 MIL/uL (ref 3.87–5.11)
RDW: 12.9 % (ref 11.5–15.5)
WBC: 7.1 10*3/uL (ref 4.0–10.5)
nRBC: 0 % (ref 0.0–0.2)

## 2019-06-19 LAB — COMPREHENSIVE METABOLIC PANEL
ALT: 22 U/L (ref 0–44)
AST: 25 U/L (ref 15–41)
Albumin: 4.2 g/dL (ref 3.5–5.0)
Alkaline Phosphatase: 63 U/L (ref 38–126)
Anion gap: 8 (ref 5–15)
BUN: 26 mg/dL — ABNORMAL HIGH (ref 8–23)
CO2: 24 mmol/L (ref 22–32)
Calcium: 9.3 mg/dL (ref 8.9–10.3)
Chloride: 96 mmol/L — ABNORMAL LOW (ref 98–111)
Creatinine, Ser: 1.05 mg/dL — ABNORMAL HIGH (ref 0.44–1.00)
GFR calc Af Amer: >60 mL/min (ref >60)
GFR calc non Af Amer: 54 mL/min — ABNORMAL LOW (ref >60)
Glucose, Bld: 109 mg/dL — ABNORMAL HIGH (ref 70–99)
Potassium: 5.1 mmol/L (ref 3.5–5.1)
Sodium: 128 mmol/L — ABNORMAL LOW (ref 135–145)
Total Bilirubin: 0.5 mg/dL (ref 0.3–1.2)
Total Protein: 7.3 g/dL (ref 6.5–8.1)

## 2019-06-19 LAB — PROTIME-INR
INR: 1 (ref 0.8–1.2)
Prothrombin Time: 13.1 seconds (ref 11.4–15.2)

## 2019-06-19 LAB — HEMOGLOBIN A1C
Hgb A1c MFr Bld: 6.1 % — ABNORMAL HIGH (ref 4.8–5.6)
Mean Plasma Glucose: 128.37 mg/dL

## 2019-06-19 LAB — ABO/RH: ABO/RH(D): O POS

## 2019-06-19 LAB — SURGICAL PCR SCREEN
MRSA, PCR: NEGATIVE
Staphylococcus aureus: NEGATIVE

## 2019-06-19 LAB — APTT: aPTT: 30 seconds (ref 24–36)

## 2019-06-19 NOTE — Progress Notes (Addendum)
PCP - Dr. Delia Chimes  [facility] [address] Cardiologist -many years (93 ) ago for irregular heartbeat-said it was stress related   Chest x-ray - none EKG - 06/11/2019 Stress Test - none ECHO - 20 years ago-negative results Cardiac Cath - none  Sleep Study - none CPAP - none  Fasting Blood Sugar - Pre-Diabetes Checks Blood Sugar __0___ times a day  Blood Thinner Instructions:none Aspirin Instructions:none Last Dose:none  Anesthesia review:  Chart to be reviewed by Royann Shivers of [facility] -review CMP results!  Patient denies shortness of breath, fever, cough and chest pain at PAT appointment   Patient verbalized understanding of instructions that were given to them at the PAT appointment. Patient was also instructed that they will need to review over the PAT instructions again at home before surgery.

## 2019-06-20 ENCOUNTER — Other Ambulatory Visit (HOSPITAL_COMMUNITY)
Admission: RE | Admit: 2019-06-20 | Discharge: 2019-06-20 | Disposition: A | Discharge status: Home / Self Care | Payer: PPO | Source: Ambulatory Visit | Attending: Orthopedic Surgery | Admitting: Orthopedic Surgery

## 2019-06-20 DIAGNOSIS — Z01812 Encounter for preprocedural laboratory examination: Secondary | ICD-10-CM | POA: Insufficient documentation

## 2019-06-20 DIAGNOSIS — Z20828 Contact with and (suspected) exposure to other viral communicable diseases: Secondary | ICD-10-CM | POA: Diagnosis not present

## 2019-06-20 NOTE — Anesthesia Preprocedure Evaluation (Addendum)
Anesthesia Evaluation  Patient identified by MRN, date of birth, ID band Patient awake    Reviewed: Allergy & Precautions, NPO status , Patient's Chart, lab work & pertinent test results  Airway Mallampati: II  TM Distance: >3 FB Neck ROM: Full    Dental no notable dental hx. (+) Dental Advisory Given, Teeth Intact   Pulmonary neg pulmonary ROS,    Pulmonary exam normal breath sounds clear to auscultation       Cardiovascular hypertension, Pt. on medications Normal cardiovascular exam Rhythm:Regular Rate:Normal     Neuro/Psych negative neurological ROS  negative psych ROS   GI/Hepatic negative GI ROS, Neg liver ROS,   Endo/Other  negative endocrine ROS  Renal/GU negative Renal ROS     Musculoskeletal  (+) Arthritis , Osteoarthritis,    Abdominal   Peds  Hematology negative hematology ROS (+)   Anesthesia Other Findings   Reproductive/Obstetrics negative OB ROS                           Anesthesia Physical Anesthesia Plan  ASA: II  Anesthesia Plan: Spinal   Post-op Pain Management:  [facility] for Post-op pain   Induction: Intravenous  PONV Risk Score and Plan: 3 and Ondansetron, Dexamethasone, Propofol infusion and Treatment may vary due to age or medical condition  Airway Management Planned: Natural Airway  Additional Equipment: None  Intra-op Plan:   Post-operative Plan:   Informed Consent: I have reviewed the patients History and Physical, chart, labs and discussed the procedure including the risks, benefits and alternatives for the proposed anesthesia with the patient or authorized representative who has indicated his/her understanding and acceptance.     Dental advisory given  Plan Discussed with: CRNA  Anesthesia Plan Comments: (See APP note by Durel Salts, FNP)       Anesthesia Quick Evaluation

## 2019-06-20 NOTE — Progress Notes (Signed)
Anesthesia Chart Review:   Case: [id number] Date/Time: 06/24/19 1222   Procedure: TOTAL KNEE ARTHROPLASTY (Right Knee) - 11min   Anesthesia type: Choice   Pre-op diagnosis: right knee osteoarthritis   Location: [facility] ROOM 10 / [facility]   Surgeon: Gaynelle Arabian, MD      DISCUSSION:  - Pt is a 68 year old female with hx HTN, prediabetes  Na 128 at pre-surgical testing. Na was 128 also at pre-op eval by PCP 06/11/19 - I reviewed Na result with Dr. Therisa Doyne   VS: BP 120/80   Pulse 98   Temp 36.7 C (Oral)   Resp 16   Ht 5\' 3"  (1.6 m)   Wt 58.8 kg   SpO2 98%   BMI 22.98 kg/m    PROVIDERS: - PCP is Forrest Moron, MD who saw pt 06/11/19 for pre-op eval. Notes in epic indicate clearance was sent to surgeon's office.    LABS:  - Na 128 at PST 06/19/19.  Na was 128 also at pre-op eval by PCP 06/11/19  (all labs ordered are listed, but only abnormal results are displayed)  Labs Reviewed  HEMOGLOBIN A1C - Abnormal; Notable for the following components:      Result Value   Hgb A1c MFr Bld 6.1 (*)    All other components within normal limits  CBC WITH DIFFERENTIAL/PLATELET - Abnormal; Notable for the following components:   Hemoglobin 11.9 (*)    All other components within normal limits  COMPREHENSIVE METABOLIC PANEL - Abnormal; Notable for the following components:   Sodium 128 (*)    Chloride 96 (*)    Glucose, Bld 109 (*)    BUN 26 (*)    Creatinine, Ser 1.05 (*)    GFR calc non Af Amer 54 (*)    All other components within normal limits  SURGICAL PCR SCREEN  APTT  PROTIME-INR  TYPE AND SCREEN  ABO/RH    EKG 06/11/19: Sinus  Rhythm. Low voltage with rightward P-axis and rotation -possible pulmonary disease.   CV: N/A   Past Medical History:  Diagnosis Date  . Arthritis   . Diverticulosis   . Ganglion of left wrist    thumb joint  . Hyperlipidemia   . Hypertension   . Pre-diabetes   . Tinnitus     Past Surgical History:  Procedure Laterality Date   . COLONOSCOPY     x 3  . TONSILLECTOMY    . TUBAL LIGATION      MEDICATIONS: . acetaminophen (TYLENOL) 500 MG tablet  . estradiol (ESTRACE) 0.1 MG/GM vaginal cream  . famotidine (PEPCID) 20 MG tablet  . ibuprofen (ADVIL) 200 MG tablet  . loperamide (IMODIUM A-D) 2 MG tablet  . Melatonin 3 MG TABS  . olmesartan (BENICAR) 40 MG tablet  . Pitavastatin Calcium (LIVALO) 2 MG TABS  . Polyethyl Glycol-Propyl Glycol (SYSTANE OP)  . pseudoephedrine (SUDAFED) 30 MG tablet  . saccharomyces boulardii (FLORASTOR) 250 MG capsule  . spironolactone (ALDACTONE) 50 MG tablet  . triamcinolone cream (KENALOG) 0.1 %   No current facility-administered medications for this encounter.     If no changes, I anticipate pt can proceed with surgery as scheduled.   Willeen Cass, FNP-BC [facility]/Anesthesiology Phone: [phone] 06/20/2019 12:36 PM

## 2019-06-22 LAB — NOVEL CORONAVIRUS, NAA (HOSP ORDER, SEND-OUT TO REF LAB; TAT 18-24 HRS): SARS-CoV-2, NAA: NOT DETECTED

## 2019-06-23 MED ORDER — BUPIVACAINE LIPOSOME 1.3 % IJ SUSP
20.0000 mL | Freq: Once | INTRAMUSCULAR | Status: DC
  Filled 2019-06-23: qty 20

## 2019-06-23 NOTE — H&P (Signed)
TOTAL KNEE ADMISSION H&P  Patient is being admitted for right total knee arthroplasty.  Subjective:  Chief Complaint:right knee pain.  HPI: Amanda Curry, 68 y.o. female, has a history of pain and functional disability in the right knee due to arthritis and has failed non-surgical conservative treatments for greater than 12 weeks to includeNSAID's and/or analgesics, corticosteriod injections and activity modification.  Onset of symptoms was gradual, starting >10 years ago with gradually worsening course since that time. The patient noted no past surgery on the right knee(s).  Patient currently rates pain in the right knee(s) at 7 out of 10 with activity. Patient has worsening of pain with activity and weight bearing and pain that interferes with activities of daily living.  Patient has evidence of bone-on-bone osteoarthritis in the lateral and patellofemoral compartments with a 10 degree valgus deformity of the right knee. . by imaging studies.  There is no active infection.  Patient Active Problem List   Diagnosis Date Noted   Weight loss, unintentional 07/28/2018   Prediabetes 01/26/2018   Abnormal thyroid blood test 01/26/2018   Asymptomatic microscopic hematuria 01/26/2018   Female cystocele 01/26/2018   HTN (hypertension) 03/21/2013   Hyperlipidemia 03/21/2013   Past Medical History:  Diagnosis Date   Arthritis    Diverticulosis    Ganglion of left wrist    thumb joint   Hyperlipidemia    Hypertension    Pre-diabetes    Tinnitus     Past Surgical History:  Procedure Laterality Date   COLONOSCOPY     x 3   TONSILLECTOMY     TUBAL LIGATION      Current Facility-Administered Medications  Medication Dose Route Frequency Provider Last Rate Last Dose   [START ON 06/24/2019] bupivacaine liposome (EXPAREL) 1.3 % injection 266 mg  20 mL Other Once Gaynelle Arabian, MD       Current Outpatient Medications  Medication Sig Dispense Refill Last Dose    acetaminophen (TYLENOL) 500 MG tablet Take 1,000 mg by mouth every 6 (six) hours as needed for moderate pain or headache.      estradiol (ESTRACE) 0.1 MG/GM vaginal cream Place 1 Applicatorful vaginally once a week.   0    famotidine (PEPCID) 20 MG tablet Take 20 mg by mouth daily as needed for heartburn or indigestion.      ibuprofen (ADVIL) 200 MG tablet Take 200 mg by mouth at bedtime.      loperamide (IMODIUM A-D) 2 MG tablet Take 1-2 mg by mouth 4 (four) times daily as needed for diarrhea or loose stools.       Melatonin 3 MG TABS Take 3 mg by mouth at bedtime as needed (sleep).       olmesartan (BENICAR) 40 MG tablet Take 1 tablet (40 mg total) by mouth daily. 90 tablet 1    Pitavastatin Calcium (LIVALO) 2 MG TABS Take 0.5 tablets (1 mg total) by mouth daily. 45 tablet 1    Polyethyl Glycol-Propyl Glycol (SYSTANE OP) Place 1 drop into both eyes daily as needed (dry eyes).      pseudoephedrine (SUDAFED) 30 MG tablet Take 30 mg by mouth daily as needed for congestion.      saccharomyces boulardii (FLORASTOR) 250 MG capsule Take 250 mg by mouth 2 (two) times daily.      spironolactone (ALDACTONE) 50 MG tablet Take 1 tablet (50 mg total) by mouth daily. 90 tablet 1    triamcinolone cream (KENALOG) 0.1 % Apply 1 application topically daily as  needed (eczema).       Allergies  Allergen Reactions   Hctz [Hydrochlorothiazide] Other (See Comments)    Causes extreme hyponatremia quickly - 126   Amlodipine Swelling    Mild but chronic lower extremity edema    Social History   Tobacco Use   Smoking status: Never Smoker   Smokeless tobacco: Never Used  Substance Use Topics   Alcohol use: Yes    Comment: rarely; 1-2x/mo    Family History  Problem Relation Age of Onset   Colon cancer Maternal Grandmother    Cancer Maternal Grandmother        colon   Prostate cancer Father    Cancer Father        prostate   Alzheimer's disease Mother    Cancer Mother     Hyperlipidemia Mother    Hypertension Mother    Heart disease Paternal Grandmother    Heart disease Paternal Grandfather    Hypertension Sister      Review of Systems  Constitutional: Negative for chills and fever.  Respiratory: Negative for cough and shortness of breath.   Cardiovascular: Negative for chest pain and palpitations.  Gastrointestinal: Negative for nausea and vomiting.  Musculoskeletal: Positive for joint pain.    Objective:  Physical Exam Patient is a 68 year old female.  Well nourished and well developed. General: Alert and oriented x3, cooperative and pleasant, no acute distress. Head: normocephalic, atraumatic, neck supple. Eyes: EOMI. Respiratory: breath sounds clear in all fields, no wheezing, rales, or rhonchi. Cardiovascular: Regular rate and rhythm, no murmurs, gallops or rubs. Abdomen: non-tender to palpation and soft, normoactive bowel sounds.  Musculoskeletal: Right Knee Exam: No effusion. Significant valgus deformity. Range of motion is 5-130 degrees. Marked crepitus on range of motion of the knee. Positive lateral, greater than medial, joint line tenderness. Stable knee.  Calves soft and nontender. Motor function intact in LE. Strength 5/5 LE bilaterally. Neuro: Distal pulses 2+. Sensation to light touch intact in LE.  Vital signs in last 24 hours:    Labs:   Estimated body mass index is 22.98 kg/m as calculated from the following:   Height as of 06/19/19: 5\' 3"  (1.6 m).   Weight as of 06/19/19: 58.8 kg.   Imaging Review Plain radiographs demonstrate severe degenerative joint disease of the right knee(s). The overall alignment ismild valgus. The bone quality appears to be adequate for age and reported activity level.  Assessment/Plan:  End stage arthritis, right knee   The patient history, physical examination, clinical judgment of the provider and imaging studies are consistent with end stage degenerative joint disease of the  right knee(s) and total knee arthroplasty is deemed medically necessary. The treatment options including medical management, injection therapy arthroscopy and arthroplasty were discussed at length. The risks and benefits of total knee arthroplasty were presented and reviewed. The risks due to aseptic loosening, infection, stiffness, patella tracking problems, thromboembolic complications and other imponderables were discussed. The patient acknowledged the explanation, agreed to proceed with the plan and consent was signed. Patient is being admitted for inpatient treatment for surgery, pain control, PT, OT, prophylactic antibiotics, VTE prophylaxis, progressive ambulation and ADL's and discharge planning. The patient is planning to be discharged home.  Therapy Plans: outpatient therapy at [facility] Disposition: Home with husband Planned DVT Prophylaxis: aspirin 325mg  BID DME needed: walker PCP: Dr. Delia Chimes TXA: IV Allergies: novocaine with epinephrine - trembles Anesthesia Concerns: none BMI: 23.4    Patient's anticipated LOS is less than 2 midnights,  meeting these requirements: - Lives within 1 hour of care - Has a competent adult at home to recover with post-op recover - NO history of  - Chronic pain requiring opiods  - Diabetes  - Coronary Artery Disease  - Heart failure  - Heart attack  - Stroke  - DVT/VTE  - Cardiac arrhythmia  - Respiratory Failure/COPD  - Renal failure  - Anemia  - Advanced Liver disease  - Patient was instructed on what medications to stop prior to surgery. - Follow-up visit in 2 weeks with Dr. Wynelle Link - Begin physical therapy following surgery - Pre-operative lab work as pre-surgical testing - Prescriptions will be provided in hospital at time of discharge  Griffith Citron, PA-C Orthopedic Surgery [facility] [place] [phone]

## 2019-06-24 ENCOUNTER — Other Ambulatory Visit: Payer: Self-pay

## 2019-06-24 ENCOUNTER — Inpatient Hospital Stay (HOSPITAL_COMMUNITY)
Admission: RE | Admit: 2019-06-24 | Discharge: 2019-06-25 | DRG: 470 | Disposition: A | Discharge status: Home / Self Care | Payer: PPO | Attending: Orthopedic Surgery | Admitting: Orthopedic Surgery

## 2019-06-24 ENCOUNTER — Encounter (HOSPITAL_COMMUNITY): Payer: Self-pay | Admitting: Emergency Medicine

## 2019-06-24 ENCOUNTER — Inpatient Hospital Stay (HOSPITAL_COMMUNITY): Payer: PPO

## 2019-06-24 ENCOUNTER — Encounter (HOSPITAL_COMMUNITY)
Admission: RE | Disposition: A | Discharge status: Home / Self Care | Payer: Self-pay | Source: Home / Self Care | Attending: Orthopedic Surgery

## 2019-06-24 ENCOUNTER — Inpatient Hospital Stay (HOSPITAL_COMMUNITY): Payer: PPO | Admitting: Anesthesiology

## 2019-06-24 DIAGNOSIS — M25561 Pain in right knee: Secondary | ICD-10-CM | POA: Diagnosis present

## 2019-06-24 DIAGNOSIS — I10 Essential (primary) hypertension: Secondary | ICD-10-CM | POA: Diagnosis present

## 2019-06-24 DIAGNOSIS — Z79899 Other long term (current) drug therapy: Secondary | ICD-10-CM

## 2019-06-24 DIAGNOSIS — E785 Hyperlipidemia, unspecified: Secondary | ICD-10-CM | POA: Diagnosis present

## 2019-06-24 DIAGNOSIS — Z96651 Presence of right artificial knee joint: Secondary | ICD-10-CM | POA: Diagnosis not present

## 2019-06-24 DIAGNOSIS — R7303 Prediabetes: Secondary | ICD-10-CM | POA: Diagnosis not present

## 2019-06-24 DIAGNOSIS — G8918 Other acute postprocedural pain: Secondary | ICD-10-CM | POA: Diagnosis not present

## 2019-06-24 DIAGNOSIS — M1710 Unilateral primary osteoarthritis, unspecified knee: Secondary | ICD-10-CM

## 2019-06-24 DIAGNOSIS — Z20828 Contact with and (suspected) exposure to other viral communicable diseases: Secondary | ICD-10-CM | POA: Diagnosis not present

## 2019-06-24 DIAGNOSIS — M1711 Unilateral primary osteoarthritis, right knee: Principal | ICD-10-CM | POA: Diagnosis present

## 2019-06-24 DIAGNOSIS — M179 Osteoarthritis of knee, unspecified: Secondary | ICD-10-CM | POA: Diagnosis present

## 2019-06-24 HISTORY — PX: TOTAL KNEE ARTHROPLASTY: SHX125

## 2019-06-24 LAB — TYPE AND SCREEN
ABO/RH(D): O POS
Antibody Screen: NEGATIVE

## 2019-06-24 SURGERY — ARTHROPLASTY, KNEE, TOTAL
Anesthesia: Spinal | Site: Knee | Laterality: Right

## 2019-06-24 MED ORDER — CHLORHEXIDINE GLUCONATE 4 % EX LIQD: 60.0000 mL | Freq: Once | CUTANEOUS | Status: DC

## 2019-06-24 MED ORDER — IRBESARTAN 150 MG PO TABS
300.0000 mg | ORAL_TABLET | Freq: Every day | ORAL | Status: DC
  Administered 2019-06-25: 300 mg via ORAL
  Filled 2019-06-24: qty 2

## 2019-06-24 MED ORDER — BISACODYL 10 MG RE SUPP: 10.0000 mg | Freq: Every day | RECTAL | Status: DC | PRN

## 2019-06-24 MED ORDER — TRANEXAMIC ACID-NACL 1000-0.7 MG/100ML-% IV SOLN
1000.0000 mg | INTRAVENOUS | Status: AC
  Administered 2019-06-24: 14:00:00 1000 mg via INTRAVENOUS
  Filled 2019-06-24: qty 100

## 2019-06-24 MED ORDER — MIDAZOLAM HCL 2 MG/2ML IJ SOLN
1.0000 mg | INTRAMUSCULAR | Status: DC
  Filled 2019-06-24: qty 2

## 2019-06-24 MED ORDER — ONDANSETRON HCL 4 MG PO TABS: 4.0000 mg | ORAL_TABLET | Freq: Four times a day (QID) | ORAL | Status: DC | PRN

## 2019-06-24 MED ORDER — MORPHINE SULFATE (PF) 2 MG/ML IV SOLN: 1.0000 mg | INTRAVENOUS | Status: DC | PRN

## 2019-06-24 MED ORDER — ONDANSETRON HCL 4 MG/2ML IJ SOLN: 4.0000 mg | Freq: Four times a day (QID) | INTRAMUSCULAR | Status: DC | PRN

## 2019-06-24 MED ORDER — SODIUM CHLORIDE (PF) 0.9 % IJ SOLN
INTRAMUSCULAR | Status: AC
  Filled 2019-06-24: qty 10

## 2019-06-24 MED ORDER — METOCLOPRAMIDE HCL 5 MG PO TABS
5.0000 mg | ORAL_TABLET | Freq: Three times a day (TID) | ORAL | Status: DC | PRN
  Filled 2019-06-24: qty 2

## 2019-06-24 MED ORDER — FLEET ENEMA 7-19 GM/118ML RE ENEM: 1.0000 | ENEMA | Freq: Once | RECTAL | Status: DC | PRN

## 2019-06-24 MED ORDER — MEPERIDINE HCL 50 MG/ML IJ SOLN: 6.2500 mg | INTRAMUSCULAR | Status: DC | PRN

## 2019-06-24 MED ORDER — MIDAZOLAM HCL 2 MG/2ML IJ SOLN
INTRAMUSCULAR | Status: AC
  Filled 2019-06-24: qty 2

## 2019-06-24 MED ORDER — PROPOFOL 10 MG/ML IV BOLUS: INTRAVENOUS | Status: DC | PRN

## 2019-06-24 MED ORDER — MENTHOL 3 MG MT LOZG: 1.0000 | LOZENGE | OROMUCOSAL | Status: DC | PRN

## 2019-06-24 MED ORDER — FENTANYL CITRATE (PF) 100 MCG/2ML IJ SOLN
50.0000 ug | INTRAMUSCULAR | Status: DC
  Administered 2019-06-24: 13:00:00 50 ug via INTRAVENOUS
  Filled 2019-06-24: qty 2

## 2019-06-24 MED ORDER — PROPOFOL 10 MG/ML IV BOLUS
INTRAVENOUS | Status: DC | PRN
  Administered 2019-06-24: 30 mg via INTRAVENOUS

## 2019-06-24 MED ORDER — FENTANYL CITRATE (PF) 100 MCG/2ML IJ SOLN
INTRAMUSCULAR | Status: AC
  Filled 2019-06-24: qty 2

## 2019-06-24 MED ORDER — ACETAMINOPHEN 500 MG PO TABS
1000.0000 mg | ORAL_TABLET | Freq: Four times a day (QID) | ORAL | Status: DC
  Administered 2019-06-24 – 2019-06-25 (×3): 1000 mg via ORAL
  Filled 2019-06-24 (×3): qty 2

## 2019-06-24 MED ORDER — CEFAZOLIN SODIUM-DEXTROSE 1-4 GM/50ML-% IV SOLN
1.0000 g | Freq: Four times a day (QID) | INTRAVENOUS | Status: AC
  Administered 2019-06-24 – 2019-06-25 (×2): 1 g via INTRAVENOUS
  Filled 2019-06-24 (×2): qty 50

## 2019-06-24 MED ORDER — DEXAMETHASONE SODIUM PHOSPHATE 10 MG/ML IJ SOLN
10.0000 mg | Freq: Once | INTRAMUSCULAR | Status: AC
  Administered 2019-06-25: 10 mg via INTRAVENOUS
  Filled 2019-06-24: qty 1

## 2019-06-24 MED ORDER — METOCLOPRAMIDE HCL 5 MG/ML IJ SOLN: 5.0000 mg | Freq: Three times a day (TID) | INTRAMUSCULAR | Status: DC | PRN

## 2019-06-24 MED ORDER — PSEUDOEPHEDRINE HCL 30 MG PO TABS
30.0000 mg | ORAL_TABLET | Freq: Every day | ORAL | Status: DC | PRN
  Filled 2019-06-24: qty 1

## 2019-06-24 MED ORDER — SODIUM CHLORIDE (PF) 0.9 % IJ SOLN
INTRAMUSCULAR | Status: AC
  Filled 2019-06-24: qty 50

## 2019-06-24 MED ORDER — BUPIVACAINE IN DEXTROSE 0.75-8.25 % IT SOLN
INTRATHECAL | Status: DC | PRN
  Administered 2019-06-24: 1.6 mL via INTRATHECAL

## 2019-06-24 MED ORDER — DEXAMETHASONE SODIUM PHOSPHATE 10 MG/ML IJ SOLN
INTRAMUSCULAR | Status: DC | PRN
  Administered 2019-06-24: 8 mg via INTRAVENOUS

## 2019-06-24 MED ORDER — ONDANSETRON HCL 4 MG/2ML IJ SOLN
INTRAMUSCULAR | Status: DC | PRN
  Administered 2019-06-24: 4 mg via INTRAVENOUS

## 2019-06-24 MED ORDER — METHOCARBAMOL 500 MG PO TABS: 500.0000 mg | ORAL_TABLET | Freq: Four times a day (QID) | ORAL | Status: DC | PRN

## 2019-06-24 MED ORDER — METOPROLOL TARTRATE 5 MG/5ML IV SOLN
INTRAVENOUS | Status: DC | PRN
  Administered 2019-06-24 (×3): 1 mg via INTRAVENOUS

## 2019-06-24 MED ORDER — DEXAMETHASONE SODIUM PHOSPHATE 10 MG/ML IJ SOLN: 8.0000 mg | Freq: Once | INTRAMUSCULAR | Status: DC

## 2019-06-24 MED ORDER — PROPOFOL 10 MG/ML IV BOLUS
INTRAVENOUS | Status: AC
  Filled 2019-06-24: qty 60

## 2019-06-24 MED ORDER — TRAMADOL HCL 50 MG PO TABS
50.0000 mg | ORAL_TABLET | Freq: Four times a day (QID) | ORAL | Status: DC | PRN
  Administered 2019-06-25: 100 mg via ORAL
  Filled 2019-06-24: qty 2

## 2019-06-24 MED ORDER — DEXAMETHASONE SODIUM PHOSPHATE 4 MG/ML IJ SOLN
INTRAMUSCULAR | Status: DC | PRN
  Administered 2019-06-24: 8 mg via PERINEURAL

## 2019-06-24 MED ORDER — POLYETHYLENE GLYCOL 3350 17 G PO PACK: 17.0000 g | PACK | Freq: Every day | ORAL | Status: DC | PRN

## 2019-06-24 MED ORDER — ONDANSETRON HCL 4 MG/2ML IJ SOLN: 4.0000 mg | Freq: Once | INTRAMUSCULAR | Status: DC | PRN

## 2019-06-24 MED ORDER — SODIUM CHLORIDE 0.9 % IR SOLN
Status: DC | PRN
  Administered 2019-06-24: 1000 mL

## 2019-06-24 MED ORDER — DOCUSATE SODIUM 100 MG PO CAPS
100.0000 mg | ORAL_CAPSULE | Freq: Two times a day (BID) | ORAL | Status: DC
  Administered 2019-06-24 – 2019-06-25 (×2): 100 mg via ORAL
  Filled 2019-06-24 (×2): qty 1

## 2019-06-24 MED ORDER — BUPIVACAINE LIPOSOME 1.3 % IJ SUSP
INTRAMUSCULAR | Status: DC | PRN
  Administered 2019-06-24: 80 mL

## 2019-06-24 MED ORDER — ACETAMINOPHEN 10 MG/ML IV SOLN
1000.0000 mg | Freq: Four times a day (QID) | INTRAVENOUS | Status: DC
  Administered 2019-06-24: 1000 mg via INTRAVENOUS
  Filled 2019-06-24: qty 100

## 2019-06-24 MED ORDER — PROPOFOL 500 MG/50ML IV EMUL
INTRAVENOUS | Status: DC | PRN
  Administered 2019-06-24: 125 ug/kg/min via INTRAVENOUS

## 2019-06-24 MED ORDER — GABAPENTIN 100 MG PO CAPS
200.0000 mg | ORAL_CAPSULE | Freq: Three times a day (TID) | ORAL | Status: DC
  Administered 2019-06-24 – 2019-06-25 (×2): 200 mg via ORAL
  Filled 2019-06-24 (×2): qty 2

## 2019-06-24 MED ORDER — MIDAZOLAM HCL 5 MG/5ML IJ SOLN
INTRAMUSCULAR | Status: DC | PRN
  Administered 2019-06-24: 1 mg via INTRAVENOUS

## 2019-06-24 MED ORDER — DIPHENHYDRAMINE HCL 12.5 MG/5ML PO ELIX: 12.5000 mg | ORAL_SOLUTION | ORAL | Status: DC | PRN

## 2019-06-24 MED ORDER — FENTANYL CITRATE (PF) 100 MCG/2ML IJ SOLN
25.0000 ug | INTRAMUSCULAR | Status: DC | PRN
  Administered 2019-06-24 (×2): 25 ug via INTRAVENOUS

## 2019-06-24 MED ORDER — METHOCARBAMOL 500 MG IVPB - SIMPLE MED
500.0000 mg | Freq: Four times a day (QID) | INTRAVENOUS | Status: DC | PRN
  Filled 2019-06-24: qty 50

## 2019-06-24 MED ORDER — FENTANYL CITRATE (PF) 100 MCG/2ML IJ SOLN
INTRAMUSCULAR | Status: DC | PRN
  Administered 2019-06-24 (×2): 50 ug via INTRAVENOUS

## 2019-06-24 MED ORDER — ASPIRIN EC 325 MG PO TBEC
325.0000 mg | DELAYED_RELEASE_TABLET | Freq: Two times a day (BID) | ORAL | Status: DC
  Administered 2019-06-25: 325 mg via ORAL
  Filled 2019-06-24: qty 1

## 2019-06-24 MED ORDER — DEXAMETHASONE SODIUM PHOSPHATE 10 MG/ML IJ SOLN
INTRAMUSCULAR | Status: AC
  Filled 2019-06-24: qty 1

## 2019-06-24 MED ORDER — ONDANSETRON HCL 4 MG/2ML IJ SOLN
INTRAMUSCULAR | Status: AC
  Filled 2019-06-24: qty 2

## 2019-06-24 MED ORDER — LOPERAMIDE HCL 2 MG PO CAPS: 2.0000 mg | ORAL_CAPSULE | Freq: Four times a day (QID) | ORAL | Status: DC | PRN

## 2019-06-24 MED ORDER — SODIUM CHLORIDE 0.9 % IV SOLN
INTRAVENOUS | Status: DC
  Administered 2019-06-25: 02:00:00 via INTRAVENOUS

## 2019-06-24 MED ORDER — SPIRONOLACTONE 25 MG PO TABS
50.0000 mg | ORAL_TABLET | Freq: Every day | ORAL | Status: DC
  Administered 2019-06-25: 50 mg via ORAL
  Filled 2019-06-24: qty 2

## 2019-06-24 MED ORDER — SODIUM CHLORIDE (PF) 0.9 % IJ SOLN
INTRAMUSCULAR | Status: DC | PRN
  Administered 2019-06-24: 60 mL

## 2019-06-24 MED ORDER — CEFAZOLIN SODIUM-DEXTROSE 2-4 GM/100ML-% IV SOLN
2.0000 g | INTRAVENOUS | Status: AC
  Administered 2019-06-24: 2 g via INTRAVENOUS
  Filled 2019-06-24: qty 100

## 2019-06-24 MED ORDER — PHENOL 1.4 % MT LIQD: 1.0000 | OROMUCOSAL | Status: DC | PRN

## 2019-06-24 MED ORDER — ROPIVACAINE HCL 7.5 MG/ML IJ SOLN
INTRAMUSCULAR | Status: DC | PRN
  Administered 2019-06-24: 20 mL via PERINEURAL

## 2019-06-24 MED ORDER — OXYCODONE HCL 5 MG PO TABS
5.0000 mg | ORAL_TABLET | ORAL | Status: DC | PRN
  Administered 2019-06-24: 10 mg via ORAL
  Filled 2019-06-24: qty 2

## 2019-06-24 MED ORDER — SODIUM CHLORIDE 0.9 % IV SOLN
INTRAVENOUS | Status: DC | PRN
  Administered 2019-06-24: 14:00:00 40 ug/min via INTRAVENOUS

## 2019-06-24 MED ORDER — CLONIDINE HCL (ANALGESIA) 100 MCG/ML EP SOLN
EPIDURAL | Status: DC | PRN
  Administered 2019-06-24: 50 ug

## 2019-06-24 MED ORDER — FAMOTIDINE 20 MG PO TABS: 20.0000 mg | ORAL_TABLET | Freq: Every day | ORAL | Status: DC | PRN

## 2019-06-24 MED ORDER — POVIDONE-IODINE 10 % EX SWAB
2.0000 "application " | Freq: Once | CUTANEOUS | Status: AC
  Administered 2019-06-24: 2 via TOPICAL

## 2019-06-24 MED ORDER — LACTATED RINGERS IV SOLN
INTRAVENOUS | Status: DC
  Administered 2019-06-24 (×3): via INTRAVENOUS

## 2019-06-24 SURGICAL SUPPLY — 55 items
ATTUNE PS FEM RT SZ 4 CEM KNEE (Femur) ×2 IMPLANT
ATTUNE PSRP INSR SZ4 8 KNEE (Insert) ×2 IMPLANT
BAG ZIPLOCK 12X15 (MISCELLANEOUS) IMPLANT
BASEPLATE TIBIAL ROTATING SZ 4 (Knees) ×2 IMPLANT
BLADE SAG 18X100X1.27 (BLADE) ×2 IMPLANT
BLADE SAW SGTL 11.0X1.19X90.0M (BLADE) ×2 IMPLANT
BLADE SURG SZ10 CARB STEEL (BLADE) ×4 IMPLANT
BNDG ELASTIC 6X5.8 VLCR STR LF (GAUZE/BANDAGES/DRESSINGS) ×2 IMPLANT
BOWL SMART MIX CTS (DISPOSABLE) ×2 IMPLANT
CEMENT HV SMART SET (Cement) ×4 IMPLANT
COVER SURGICAL LIGHT HANDLE (MISCELLANEOUS) ×2 IMPLANT
COVER WAND RF STERILE (DRAPES) IMPLANT
CUFF TOURN SGL QUICK 34 (TOURNIQUET CUFF) ×2
CUFF TRNQT CYL 34X4.125X (TOURNIQUET CUFF) ×1 IMPLANT
DECANTER SPIKE VIAL GLASS SM (MISCELLANEOUS) ×2 IMPLANT
DRAPE U-SHAPE 47X51 STRL (DRAPES) ×2 IMPLANT
DRSG ADAPTIC 3X8 NADH LF (GAUZE/BANDAGES/DRESSINGS) ×2 IMPLANT
DRSG PAD ABDOMINAL 8X10 ST (GAUZE/BANDAGES/DRESSINGS) ×2 IMPLANT
DURAPREP 26ML APPLICATOR (WOUND CARE) ×2 IMPLANT
ELECT REM PT RETURN 15FT ADLT (MISCELLANEOUS) ×2 IMPLANT
EVACUATOR 1/8 PVC DRAIN (DRAIN) ×2 IMPLANT
GAUZE SPONGE 4X4 12PLY STRL (GAUZE/BANDAGES/DRESSINGS) ×2 IMPLANT
GLOVE BIO SURGEON STRL SZ7 (GLOVE) ×2 IMPLANT
GLOVE BIO SURGEON STRL SZ8 (GLOVE) ×12 IMPLANT
GLOVE BIOGEL PI IND STRL 6.5 (GLOVE) ×1 IMPLANT
GLOVE BIOGEL PI IND STRL 7.0 (GLOVE) ×1 IMPLANT
GLOVE BIOGEL PI IND STRL 8 (GLOVE) ×1 IMPLANT
GLOVE BIOGEL PI INDICATOR 6.5 (GLOVE) ×1
GLOVE BIOGEL PI INDICATOR 7.0 (GLOVE) ×1
GLOVE BIOGEL PI INDICATOR 8 (GLOVE) ×1
GLOVE SURG SS PI 6.5 STRL IVOR (GLOVE) ×2 IMPLANT
GOWN STRL REUS W/TWL LRG LVL3 (GOWN DISPOSABLE) ×8 IMPLANT
HANDPIECE INTERPULSE COAX TIP (DISPOSABLE) ×1
HOLDER FOLEY CATH W/STRAP (MISCELLANEOUS) ×2 IMPLANT
IMMOBILIZER KNEE 20 (SOFTGOODS) ×2
IMMOBILIZER KNEE 20 THIGH 36 (SOFTGOODS) ×1 IMPLANT
KIT TURNOVER KIT A (KITS) ×2 IMPLANT
MANIFOLD NEPTUNE II (INSTRUMENTS) ×2 IMPLANT
NS IRRIG 1000ML POUR BTL (IV SOLUTION) ×2 IMPLANT
PACK TOTAL KNEE CUSTOM (KITS) ×2 IMPLANT
PADDING CAST COTTON 6X4 STRL (CAST SUPPLIES) ×6 IMPLANT
PATELLA MEDIAL ATTUN 35MM KNEE (Knees) ×2 IMPLANT
PIN STEINMAN FIXATION KNEE (PIN) ×2 IMPLANT
PROTECTOR NERVE ULNAR (MISCELLANEOUS) ×2 IMPLANT
SET HNDPC FAN SPRY TIP SCT (DISPOSABLE) ×1 IMPLANT
STRIP CLOSURE SKIN 1/2X4 (GAUZE/BANDAGES/DRESSINGS) ×4 IMPLANT
SUT MNCRL AB 4-0 PS2 18 (SUTURE) ×2 IMPLANT
SUT STRATAFIX 0 PDS 27 VIOLET (SUTURE) ×2
SUT VIC AB 2-0 CT1 27 (SUTURE) ×6
SUT VIC AB 2-0 CT1 TAPERPNT 27 (SUTURE) ×3 IMPLANT
SUTURE STRATFX 0 PDS 27 VIOLET (SUTURE) ×1 IMPLANT
TRAY FOLEY MTR SLVR 16FR STAT (SET/KITS/TRAYS/PACK) ×2 IMPLANT
WATER STERILE IRR 1000ML POUR (IV SOLUTION) ×4 IMPLANT
WRAP KNEE MAXI GEL POST OP (GAUZE/BANDAGES/DRESSINGS) ×2 IMPLANT
YANKAUER SUCT BULB TIP 10FT TU (MISCELLANEOUS) ×2 IMPLANT

## 2019-06-24 NOTE — Anesthesia Procedure Notes (Signed)
Spinal  Patient location during procedure: OR Staffing Anesthesiologist: Nolon Nations, MD Resident/CRNA: Lissa Morales, CRNA Performed: anesthesiologist and resident/CRNA  Preanesthetic Checklist Completed: patient identified, site marked, surgical consent, pre-op evaluation, timeout performed, IV checked, risks and benefits discussed and monitors and equipment checked Spinal Block Patient position: sitting Prep: site prepped and draped and DuraPrep Patient monitoring: heart rate, cardiac monitor, continuous pulse ox and blood pressure Approach: right paramedian Location: L3-4 Injection technique: single-shot Needle Needle type: Sprotte  Needle gauge: 24 G Needle length: 9 cm Assessment Sensory level: T4 Additional Notes Expiration date of kit checked and confirmed. Patient tolerated procedure well, without complications.

## 2019-06-24 NOTE — Anesthesia Postprocedure Evaluation (Signed)
Anesthesia Post Note  Patient: Amanda Curry  Procedure(s) Performed: TOTAL KNEE ARTHROPLASTY (Right Knee)     Patient location during evaluation: PACU Anesthesia Type: Spinal Level of consciousness: oriented and awake and alert Pain management: pain level controlled Vital Signs Assessment: post-procedure vital signs reviewed and stable Respiratory status: spontaneous breathing, respiratory function stable and patient connected to nasal cannula oxygen Cardiovascular status: blood pressure returned to baseline and stable Postop Assessment: no headache, no backache and no apparent nausea or vomiting Anesthetic complications: no    Last Vitals:  Vitals:   06/24/19 1700 06/24/19 1800  BP: 131/86 138/87  Pulse:  75  Resp: 14 15  Temp:    SpO2: 97% 100%    Last Pain:  Vitals:   06/24/19 1800  TempSrc:   PainSc: [name]

## 2019-06-24 NOTE — Op Note (Signed)
OPERATIVE REPORT-TOTAL KNEE ARTHROPLASTY   Pre-operative diagnosis- Osteoarthritis  Right knee(s)  Post-operative diagnosis- Osteoarthritis Right knee(s)  Procedure-  Right  Total Knee Arthroplasty  Surgeon- Dione Plover. Kenzo Ozment, MD  Assistant- Ardeen Jourdain, PA-C   Anesthesia-  Adductor canal block and spinal  EBL-50 mL   Drains Hemovac  Tourniquet time-  Total Tourniquet Time Documented: Thigh (Right) - 31 minutes Total: Thigh (Right) - 31 minutes     Complications- None  Condition-PACU - hemodynamically stable.   Brief Clinical Note  Amanda Curry is a 68 y.o. year old female with end stage OA of her right knee with progressively worsening pain and dysfunction. She has constant pain, with activity and at rest and significant functional deficits with difficulties even with ADLs. She has had extensive non-op management including analgesics, injections of cortisone and viscosupplements, and home exercise program, but remains in significant pain with significant dysfunction.Radiographs show bone on bone arthritis lateral and patellofemoral with valgus deformity. She presents now for right Total Knee Arthroplasty.    Procedure in detail---   The patient is brought into the operating room and positioned supine on the operating table. After successful administration of  adductor canal block and spinal,   a tourniquet is placed high on the  Right thigh(s) and the lower extremity is prepped and draped in the usual sterile fashion. Time out is performed by the operating team and then the  Right lower extremity is wrapped in Esmarch, knee flexed and the tourniquet inflated to 300 mmHg.       A midline incision is made with a ten blade through the subcutaneous tissue to the level of the extensor mechanism. A fresh blade is used to make a medial parapatellar arthrotomy. Soft tissue over the proximal medial tibia is subperiosteally elevated to the joint line with a knife and into the  semimembranosus bursa with a Cobb elevator. Soft tissue over the proximal lateral tibia is elevated with attention being paid to avoiding the patellar tendon on the tibial tubercle. The patella is everted, knee flexed 90 degrees and the ACL and PCL are removed. Findings are bone on bone lateral and patellofemoral with large global osteophytes.        The drill is used to create a starting hole in the distal femur and the canal is thoroughly irrigated with sterile saline to remove the fatty contents. The 5 degree Right  valgus alignment guide is placed into the femoral canal and the distal femoral cutting block is pinned to remove 9 mm off the distal femur. Resection is made with an oscillating saw.      The tibia is subluxed forward and the menisci are removed. The extramedullary alignment guide is placed referencing proximally at the medial aspect of the tibial tubercle and distally along the second metatarsal axis and tibial crest. The block is pinned to remove 70mm off the more deficient lateral  side. Resection is made with an oscillating saw. Size 4is the most appropriate size for the tibia and the proximal tibia is prepared with the modular drill and keel punch for that size.      The femoral sizing guide is placed and size 4 is most appropriate. Rotation is marked off the epicondylar axis and confirmed by creating a rectangular flexion gap at 90 degrees. The size 4 cutting block is pinned in this rotation and the anterior, posterior and chamfer cuts are made with the oscillating saw. The intercondylar block is then placed and that cut is  made.      Trial size 4 tibial component, trial size 4 posterior stabilized femur and a 8  mm posterior stabilized rotating platform insert trial is placed. Full extension is achieved with excellent varus/valgus and anterior/posterior balance throughout full range of motion. The patella is everted and thickness measured to be 22  mm. Free hand resection is taken to 12 mm,  a 35 template is placed, lug holes are drilled, trial patella is placed, and it tracks normally. Osteophytes are removed off the posterior femur with the trial in place. All trials are removed and the cut bone surfaces prepared with pulsatile lavage. Cement is mixed and once ready for implantation, the size 4 tibial implant, size  4 posterior stabilized femoral component, and the size 35 patella are cemented in place and the patella is held with the clamp. The trial insert is placed and the knee held in full extension. The Exparel (20 ml mixed with 60 ml saline) is injected into the extensor mechanism, posterior capsule, medial and lateral gutters and subcutaneous tissues.  All extruded cement is removed and once the cement is hard the permanent 8 mm posterior stabilized rotating platform insert is placed into the tibial tray.      The wound is copiously irrigated with saline solution and the extensor mechanism closed over a hemovac drain with #1 V-loc suture. The tourniquet is released for a total tourniquet time of 31  minutes. Flexion against gravity is 140 degrees and the patella tracks normally. Subcutaneous tissue is closed with 2.0 vicryl and subcuticular with running 4.0 Monocryl. The incision is cleaned and dried and steri-strips and a bulky sterile dressing are applied. The limb is placed into a knee immobilizer and the patient is awakened and transported to recovery in stable condition.      Please note that a surgical assistant was a medical necessity for this procedure in order to perform it in a safe and expeditious manner. Surgical assistant was necessary to retract the ligaments and vital neurovascular structures to prevent injury to them and also necessary for proper positioning of the limb to allow for anatomic placement of the prosthesis.   Dione Plover Idus Rathke, MD    06/24/2019, 3:03 PM

## 2019-06-24 NOTE — Plan of Care (Signed)
Continue current POC 

## 2019-06-24 NOTE — Transfer of Care (Signed)
Immediate Anesthesia Transfer of Care Note  Patient: Amanda Curry  Procedure(s) Performed: TOTAL KNEE ARTHROPLASTY (Right Knee)  Patient Location: PACU  Anesthesia Type:Spinal  Level of Consciousness: awake, alert , oriented and patient cooperative  Airway & Oxygen Therapy: Patient Spontanous Breathing and Patient connected to face mask oxygen  Post-op Assessment: Report given to RN and Post -op Vital signs reviewed and stable  Post vital signs: stable  Last Vitals:  Vitals Value Taken Time  BP 112/81 06/24/19 1530  Temp    Pulse 90 06/24/19 1532  Resp 13 06/24/19 1532  SpO2 100 % 06/24/19 1532  Vitals shown include unvalidated device data.  Last Pain:  Vitals:   06/24/19 1337  TempSrc:   PainSc: 0-No pain      Patients Stated Pain Goal: 4 ([id number])  Complications: No apparent anesthesia complications

## 2019-06-24 NOTE — Interval H&P Note (Signed)
History and Physical Interval Note:  06/24/2019 10:23 AM  Amanda Curry  has presented today for surgery, with the diagnosis of right knee osteoarthritis.  The various methods of treatment have been discussed with the patient and family. After consideration of risks, benefits and other options for treatment, the patient has consented to  Procedure(s) with comments: TOTAL KNEE ARTHROPLASTY (Right) - 34min as a surgical intervention.  The patient's history has been reviewed, patient examined, no change in status, stable for surgery.  I have reviewed the patient's chart and labs.  Questions were answered to the patient's satisfaction.     Pilar Plate Denvil Canning

## 2019-06-24 NOTE — Progress Notes (Signed)
Assisted Dr. Germeroth with right, ultrasound guided, adductor canal block. Side rails up, monitors on throughout procedure. See vital signs in flow sheet. Tolerated Procedure well. 

## 2019-06-24 NOTE — Anesthesia Procedure Notes (Signed)
Anesthesia Regional Block: Adductor canal block   Pre-Anesthetic Checklist: ,, timeout performed, Correct Patient, Correct Site, Correct Laterality, Correct Procedure, Correct Position, site marked, Risks and benefits discussed,  Surgical consent,  Pre-op evaluation,  At surgeon's request and post-op pain management  Laterality: Right  Prep: chloraprep       Needles:  Injection technique: Single-shot  Needle Type: Stimiplex     Needle Length: 9cm  Needle Gauge: 21     Additional Needles:   Procedures:,,,, ultrasound used (permanent image in chart),,,,  Narrative:  Start time: 06/24/2019 12:50 PM End time: 06/24/2019 12:55 PM Injection made incrementally with aspirations every 5 mL.  Performed by: Personally  Anesthesiologist: Nolon Nations, MD  Additional Notes: BP cuff, EKG monitors applied. Sedation begun. Artery and nerve location verified with U/S and anesthetic injected incrementally, slowly, and after negative aspirations under direct u/s guidance. Good fascial /perineural spread. Tolerated well.

## 2019-06-24 NOTE — Discharge Instructions (Signed)
° °Dr. Frank Aluisio °Total Joint Specialist °[facility] °[address] °[place], [place] [address] °[phone] ° °TOTAL KNEE REPLACEMENT POSTOPERATIVE DIRECTIONS ° °Knee Rehabilitation, Guidelines Following Surgery  °Results after knee surgery are often greatly improved when you follow the exercise, range of motion and muscle strengthening exercises prescribed by your doctor. Safety measures are also important to protect the knee from further injury. Any time any of these exercises cause you to have increased pain or swelling in your knee joint, decrease the amount until you are comfortable again and slowly increase them. If you have problems or questions, call your caregiver or physical therapist for advice.  ° °HOME CARE INSTRUCTIONS  °• Remove items at home which could result in a fall. This includes throw rugs or furniture in walking pathways.  °· ICE to the affected knee every three hours for 30 minutes at a time and then as needed for pain and swelling.  Continue to use ice on the knee for pain and swelling from surgery. You may notice swelling that will progress down to the foot and ankle.  This is normal after surgery.  Elevate the leg when you are not up walking on it.   °· Continue to use the breathing machine which will help keep your temperature down.  It is common for your temperature to cycle up and down following surgery, especially at night when you are not up moving around and exerting yourself.  The breathing machine keeps your lungs expanded and your temperature down. °· Do not place pillow under knee, focus on keeping the knee straight while resting ° °DIET °You may resume your previous home diet once your are discharged from the hospital. ° °DRESSING / WOUND CARE / SHOWERING °You may shower 3 days after surgery, but keep the wounds dry during showering.  You may use an occlusive plastic wrap (Press'n Seal for example), NO SOAKING/SUBMERGING IN THE BATHTUB.  If the bandage  gets wet, change with a clean dry gauze.  If the incision gets wet, pat the wound dry with a clean towel. °You may start showering once you are discharged home but do not submerge the incision under water. Just pat the incision dry and apply a dry gauze dressing on daily. °Change the surgical dressing daily and reapply a dry dressing each time. ° °ACTIVITY °Walk with your walker as instructed. °Use walker as long as suggested by your caregivers. °Avoid periods of inactivity such as sitting longer than an hour when not asleep. This helps prevent blood clots.  °You may resume a sexual relationship in one month or when given the OK by your doctor.  °You may return to work once you are cleared by your doctor.  °Do not drive a car for 6 weeks or until released by you surgeon.  °Do not drive while taking narcotics. ° °WEIGHT BEARING °Weight bearing as tolerated with assist device (walker, cane, etc) as directed, use it as long as suggested by your surgeon or therapist, typically at least 4-6 weeks. ° °POSTOPERATIVE CONSTIPATION PROTOCOL °Constipation - defined medically as fewer than three stools per week and severe constipation as less than one stool per week. ° °One of the most common issues patients have following surgery is constipation.  Even if you have a regular bowel pattern at home, your normal regimen is likely to be disrupted due to multiple reasons following surgery.  Combination of anesthesia, postoperative narcotics, change in appetite and fluid intake all can affect your bowels.    In order to avoid complications following surgery, here are some recommendations in order to help you during your recovery period. ° °Colace (docusate) - Pick up an over-the-counter form of Colace or another stool softener and take twice a day as long as you are requiring postoperative pain medications.  Take with a full glass of water daily.  If you experience loose stools or diarrhea, hold the colace until you stool forms back  up.  If your symptoms do not get better within 1 week or if they get worse, check with your doctor. ° °Dulcolax (bisacodyl) - Pick up over-the-counter and take as directed by the product packaging as needed to assist with the movement of your bowels.  Take with a full glass of water.  Use this product as needed if not relieved by Colace only.  ° °MiraLax (polyethylene glycol) - Pick up over-the-counter to have on hand.  MiraLax is a solution that will increase the amount of water in your bowels to assist with bowel movements.  Take as directed and can mix with a glass of water, juice, soda, coffee, or tea.  Take if you go more than two days without a movement. °Do not use MiraLax more than once per day. Call your doctor if you are still constipated or irregular after using this medication for 7 days in a row. ° °If you continue to have problems with postoperative constipation, please contact the office for further assistance and recommendations.  If you experience "the worst abdominal pain ever" or develop nausea or vomiting, please contact the office immediatly for further recommendations for treatment. ° °ITCHING ° If you experience itching with your medications, try taking only a single pain pill, or even half a pain pill at a time.  You can also use Benadryl over the counter for itching or also to help with sleep.  ° °TED HOSE STOCKINGS °Wear the elastic stockings on both legs for three weeks following surgery during the day but you may remove then at night for sleeping. ° °MEDICATIONS °See your medication summary on the “After Visit Summary” that the nursing staff will review with you prior to discharge.  You may have some home medications which will be placed on hold until you complete the course of blood thinner medication.  It is important for you to complete the blood thinner medication as prescribed by your surgeon.  Continue your approved medications as instructed at time of discharge. ° °PRECAUTIONS °If  you experience chest pain or shortness of breath - call 911 immediately for transfer to the hospital emergency department.  °If you develop a fever greater that 101 F, purulent drainage from wound, increased redness or drainage from wound, foul odor from the wound/dressing, or calf pain - CONTACT YOUR SURGEON.   °                                                °FOLLOW-UP APPOINTMENTS °Make sure you keep all of your appointments after your operation with your surgeon and caregivers. You should call the office at the above phone number and make an appointment for approximately two weeks after the date of your surgery or on the date instructed by your surgeon outlined in the "After Visit Summary". ° ° °RANGE OF MOTION AND STRENGTHENING EXERCISES  °Rehabilitation of the knee is important following a knee injury or   an operation. After just a few days of immobilization, the muscles of the thigh which control the knee become weakened and shrink (atrophy). Knee exercises are designed to build up the tone and strength of the thigh muscles and to improve knee motion. Often times heat used for twenty to thirty minutes before working out will loosen up your tissues and help with improving the range of motion but do not use heat for the first two weeks following surgery. These exercises can be done on a training (exercise) mat, on the floor, on a table or on a bed. Use what ever works the best and is most comfortable for you Knee exercises include:  °• Leg Lifts - While your knee is still immobilized in a splint or cast, you can do straight leg raises. Lift the leg to 60 degrees, hold for 3 sec, and slowly lower the leg. Repeat 10-20 times 2-3 times daily. Perform this exercise against resistance later as your knee gets better.  °• Quad and Hamstring Sets - Tighten up the muscle on the front of the thigh (Quad) and hold for 5-10 sec. Repeat this 10-20 times hourly. Hamstring sets are done by pushing the foot backward against an  object and holding for 5-10 sec. Repeat as with quad sets.  °· Leg Slides: Lying on your back, slowly slide your foot toward your buttocks, bending your knee up off the floor (only go as far as is comfortable). Then slowly slide your foot back down until your leg is flat on the floor again. °· Angel Wings: Lying on your back spread your legs to the side as far apart as you can without causing discomfort.  °A rehabilitation program following serious knee injuries can speed recovery and prevent re-injury in the future due to weakened muscles. Contact your doctor or a physical therapist for more information on knee rehabilitation.  ° °IF YOU ARE TRANSFERRED TO A SKILLED REHAB FACILITY °If the patient is transferred to a skilled rehab facility following release from the hospital, a list of the current medications will be sent to the facility for the patient to continue.  When discharged from the skilled rehab facility, please have the facility set up the patient's Home Health Physical Therapy prior to being released. Also, the skilled facility will be responsible for providing the patient with their medications at time of release from the facility to include their pain medication, the muscle relaxants, and their blood thinner medication. If the patient is still at the rehab facility at time of the two week follow up appointment, the skilled rehab facility will also need to assist the patient in arranging follow up appointment in our office and any transportation needs. ° °MAKE SURE YOU:  °• Understand these instructions.  °• Get help right away if you are not doing well or get worse.  ° ° °Pick up stool softner and laxative for home use following surgery while on pain medications. °Do not submerge incision under water. °Please use good hand washing techniques while changing dressing each day. °May shower starting three days after surgery. °Please use a clean towel to pat the incision dry following showers. °Continue to  use ice for pain and swelling after surgery. °Do not use any lotions or creams on the incision until instructed by your surgeon. ° °

## 2019-06-25 ENCOUNTER — Encounter (HOSPITAL_COMMUNITY): Payer: Self-pay | Admitting: Orthopedic Surgery

## 2019-06-25 LAB — CBC
HCT: 27 % — ABNORMAL LOW (ref 36.0–46.0)
Hemoglobin: 8.7 g/dL — ABNORMAL LOW (ref 12.0–15.0)
MCH: 30.2 pg (ref 26.0–34.0)
MCHC: 32.2 g/dL (ref 30.0–36.0)
MCV: 93.8 fL (ref 80.0–100.0)
Platelets: 250 10*3/uL (ref 150–400)
RBC: 2.88 MIL/uL — ABNORMAL LOW (ref 3.87–5.11)
RDW: 12.8 % (ref 11.5–15.5)
WBC: 9.8 10*3/uL (ref 4.0–10.5)
nRBC: 0 % (ref 0.0–0.2)

## 2019-06-25 LAB — BASIC METABOLIC PANEL
Anion gap: 8 (ref 5–15)
BUN: 15 mg/dL (ref 8–23)
CO2: 23 mmol/L (ref 22–32)
Calcium: 8.8 mg/dL — ABNORMAL LOW (ref 8.9–10.3)
Chloride: 100 mmol/L (ref 98–111)
Creatinine, Ser: 0.8 mg/dL (ref 0.44–1.00)
GFR calc Af Amer: >60 mL/min (ref >60)
GFR calc non Af Amer: >60 mL/min (ref >60)
Glucose, Bld: 142 mg/dL — ABNORMAL HIGH (ref 70–99)
Potassium: 4.3 mmol/L (ref 3.5–5.1)
Sodium: 131 mmol/L — ABNORMAL LOW (ref 135–145)

## 2019-06-25 MED ORDER — ASPIRIN 325 MG PO TBEC: 325.0000 mg | DELAYED_RELEASE_TABLET | Freq: Two times a day (BID) | ORAL | 0 refills | Status: AC

## 2019-06-25 MED ORDER — TRAMADOL HCL 50 MG PO TABS: 50.0000 mg | ORAL_TABLET | Freq: Four times a day (QID) | ORAL | 0 refills | Status: DC | PRN

## 2019-06-25 MED ORDER — METHOCARBAMOL 500 MG PO TABS: 500.0000 mg | ORAL_TABLET | Freq: Four times a day (QID) | ORAL | 0 refills | Status: DC | PRN

## 2019-06-25 MED ORDER — OXYCODONE HCL 5 MG PO TABS: 5.0000 mg | ORAL_TABLET | Freq: Four times a day (QID) | ORAL | 0 refills | Status: DC | PRN

## 2019-06-25 MED ORDER — GABAPENTIN 100 MG PO CAPS: 200.0000 mg | ORAL_CAPSULE | Freq: Three times a day (TID) | ORAL | 0 refills | Status: DC

## 2019-06-25 NOTE — TOC Transition Note (Signed)
Transition of Care [facility]) - CM/SW Discharge Note   Patient Details  Name: Amanda Curry MRN: [id number] Date of Birth: [birth year]  Transition of Care [facility]) CM/SW Contact:  Lia Hopping, [place] Phone Number: 06/25/2019, 9:47 AM   Clinical Narrative:       Final next level of care: OP Rehab Barriers to Discharge: No Barriers Identified   Patient Goals and CMS Choice     Choice offered to / list presented to : NA  Discharge Placement                       Discharge Plan and Services                DME Arranged: 3-N-1, Walker rolling DME Agency: [employer] Date DME Agency Contacted: 06/25/19 Time DME Agency Contacted: 0900 Representative spoke with at DME Agency: [place] Interventions     Readmission Risk Interventions No flowsheet data found.

## 2019-06-25 NOTE — Progress Notes (Signed)
   Subjective: 1 Day Post-Op Procedure(s) (LRB): TOTAL KNEE ARTHROPLASTY (Right) Patient reports pain as mild.   Patient seen in rounds by Dr. Wynelle Link. Patient is well, and has had no acute complaints or problems other than discomfort in the right knee. Foley catheter removed, positive flatus. No acute events overnight. Denies CP, SHOB.  We will start therapy today.   Objective: Vital signs in last 24 hours: Temp:  [98.1 F (36.7 C)-98.8 F (37.1 C)] 98.1 F (36.7 C) (10/27 0348) Pulse Rate:  [69-117] 69 (10/27 0348) Resp:  [10-24] 14 (10/27 0348) BP: (101-179)/(72-103) 105/75 (10/27 0348) SpO2:  [96 %-100 %] 100 % (10/27 0348) Weight:  [58.8 kg] 58.8 kg (10/26 1035)  Intake/Output from previous day:  Intake/Output Summary (Last 24 hours) at 06/25/2019 0737 Last data filed at 06/25/2019 0549 Gross per 24 hour  Intake 3296.25 ml  Output 1167 ml  Net 2129.25 ml     Intake/Output this shift: No intake/output data recorded.  Labs: Recent Labs    06/25/19 0256  HGB 8.7*   Recent Labs    06/25/19 0256  WBC 9.8  RBC 2.88*  HCT 27.0*  PLT 250   Recent Labs    06/25/19 0256  NA 131*  K 4.3  CL 100  CO2 23  BUN 15  CREATININE 0.80  GLUCOSE 142*  CALCIUM 8.8*   No results for input(s): LABPT, INR in the last 72 hours.  Exam: General - Patient is Alert and Oriented Extremity - Neurologically intact Sensation intact distally Intact pulses distally Dorsiflexion/Plantar flexion intact Dressing - dressing C/D/I Motor Function - intact, moving foot and toes well on exam.   Past Medical History:  Diagnosis Date  . Arthritis   . Diverticulosis   . Ganglion of left wrist    thumb joint  . Hyperlipidemia   . Hypertension   . Pre-diabetes   . Tinnitus     Assessment/Plan: 1 Day Post-Op Procedure(s) (LRB): TOTAL KNEE ARTHROPLASTY (Right) Principal Problem:   OA (osteoarthritis) of knee  Estimated body mass index is 22.98 kg/m as calculated from the  following:   Height as of this encounter: 5\' 3"  (1.6 m).   Weight as of this encounter: 58.8 kg. Advance diet Up with therapy D/C IV fluids   Patient's anticipated LOS is less than 2 midnights, meeting these requirements: - Lives within 1 hour of care - Has a competent adult at home to recover with post-op recover - NO history of  - Chronic pain requiring opiods  - Diabetes  - Coronary Artery Disease  - Heart failure  - Heart attack  - Stroke  - DVT/VTE  - Cardiac arrhythmia  - Respiratory Failure/COPD  - Renal failure  - Anemia  - Advanced Liver disease  DVT Prophylaxis - Aspirin Weight bearing as tolerated. D/C O2 and pulse ox and try on room air. Hemovac pulled without difficulty, will begin therapy today.  Plan is to go Home after hospital stay. Plan for discharge today following 1-2 sessions of therapy as long as she is meeting her goals with therapy. Scheduled for OPPT. Follow up in the office in 2 weeks.   Griffith Citron, PA-C Orthopedic Surgery [phone] 06/25/2019, 7:37 AM

## 2019-06-25 NOTE — Progress Notes (Signed)
Physical Therapy Treatment Patient Details Name: Amanda Curry MRN: [id number] DOB: 1950/11/16 Today's Date: 06/25/2019    History of Present Illness 68 yo female s/p R TKA 06/24/19.    PT Comments    Progressing well with mobility. Issued HEP for pt to perform 2x/day until she begins OP PT. All education completed. Okay to d/c from PT standpoint.    Follow Up Recommendations  Follow surgeon's recommendation for DC plan and follow-up therapies     Equipment Recommendations  Rolling walker with 5" wheels(youth height)    Recommendations for Other Services       Precautions / Restrictions Precautions Precautions: Fall;Knee Restrictions Weight Bearing Restrictions: No Other Position/Activity Restrictions: WBAT    Mobility  Bed Mobility               General bed mobility comments: oob in recliner  Transfers Overall transfer level: Needs assistance Equipment used: Rolling walker (2 wheeled) Transfers: Sit to/from Stand Sit to Stand: Min guard         General transfer comment: VCs safety, technique, hand placement. Close guard for safety  Ambulation/Gait Ambulation/Gait assistance: Min guard Gait Distance (Feet): 125 Feet Assistive device: Rolling walker (2 wheeled) Gait Pattern/deviations: Step-to pattern;Step-through pattern;Decreased stride length     General Gait Details: Close guard for safety. VCs safety, technique, sequence, step length.   Stairs Stairs: Yes Stairs assistance: Min assist Stair Management: Forwards;One rail Right;Step to pattern Number of Stairs: 2 General stair comments: VCs safety, technique, sequence. Small amount of assist to steady.   Wheelchair Mobility    Modified Rankin (Stroke Patients Only)       Balance Overall balance assessment: Needs assistance         Standing balance support: Bilateral upper extremity supported Standing balance-Leahy Scale: Poor                              Cognition  Arousal/Alertness: Awake/alert Behavior During Therapy: WFL for tasks assessed/performed Overall Cognitive Status: Within Functional Limits for tasks assessed                                        Exercises Total Joint Exercises Ankle Circles/Pumps: AROM;Both;10 reps;Seated Quad Sets: AROM;Both;10 reps;Seated Heel Slides: AAROM;Right;10 reps;Seated Hip ABduction/ADduction: AROM;Right;10 reps;Seated Straight Leg Raises: AROM;Right;10 reps;Supine Goniometric ROM: ~10-65 degrees    General Comments        Pertinent Vitals/Pain Pain Assessment: 0-10 Pain Score: 5  Pain Location: R knee Pain Descriptors / Indicators: Aching;Sore Pain Intervention(s): Monitored during session    Home Living                      Prior Function            PT Goals (current goals can now be found in the care plan section) Acute Rehab PT Goals Patient Stated Goal: regain independence PT Goal Formulation: With patient Time For Goal Achievement: 07/09/19 Potential to Achieve Goals: Good Progress towards PT goals: Progressing toward goals    Frequency    7X/week      PT Plan Current plan remains appropriate    Co-evaluation              AM-PAC PT "6 Clicks" Mobility   Outcome Measure  Help needed turning from your back to your side while in a  flat bed without using bedrails?: A Little Help needed moving from lying on your back to sitting on the side of a flat bed without using bedrails?: A Little Help needed moving to and from a bed to a chair (including a wheelchair)?: A Little Help needed standing up from a chair using your arms (e.g., wheelchair or bedside chair)?: A Little Help needed to walk in hospital room?: A Little Help needed climbing 3-5 steps with a railing? : A Little 6 Click Score: 18    End of Session Equipment Utilized During Treatment: Gait belt Activity Tolerance: Patient tolerated treatment well Patient left: in chair;with call  bell/phone within reach;with chair alarm set   PT Visit Diagnosis: Unsteadiness on feet (R26.81);Pain;Other abnormalities of gait and mobility (R26.89) Pain - Right/Left: Right Pain - part of body: Knee     Time: [id number] PT Time Calculation (min) (ACUTE ONLY): 16 min  Charges:  $Gait Training: 8-22 mins                       Weston Anna, [place] Pager: [phone] Office: [phone]

## 2019-06-25 NOTE — Evaluation (Signed)
Physical Therapy Evaluation Patient Details Name: Amanda Curry MRN: [id number] DOB: 1950-09-10 Today's Date: 06/25/2019   History of Present Illness  68 yo female s/p R TKA 06/24/19.  Clinical Impression  On eval, pt was Min guard assist for mobility. She walked ~75 feet with a RW. Mild pain with activity. Will plan to have a 2nd session prior to d/c later today. Plan is for OP PT.     Follow Up Recommendations Follow surgeon's recommendation for DC plan and follow-up therapies    Equipment Recommendations  Rolling walker with 5" wheels (youth height)    Recommendations for Other Services       Precautions / Restrictions Precautions Precautions: Fall;Knee Restrictions Weight Bearing Restrictions: No Other Position/Activity Restrictions: WBAT      Mobility  Bed Mobility               General bed mobility comments: oob in recliner  Transfers Overall transfer level: Needs assistance Equipment used: Rolling walker (2 wheeled) Transfers: Sit to/from Stand Sit to Stand: Min guard         General transfer comment: VCs safety, technique, hand placement.  Ambulation/Gait Ambulation/Gait assistance: Min guard Gait Distance (Feet): 75 Feet Assistive device: Rolling walker (2 wheeled) Gait Pattern/deviations: Step-to pattern;Step-through pattern;Decreased stride length     General Gait Details: Close guard for safety. VCs safety, technique, sequence, step length.  Stairs            Wheelchair Mobility    Modified Rankin (Stroke Patients Only)       Balance Overall balance assessment: Needs assistance         Standing balance support: Bilateral upper extremity supported Standing balance-Leahy Scale: Poor                               Pertinent Vitals/Pain Pain Assessment: 0-10 Pain Score: 5  Pain Location: R knee Pain Descriptors / Indicators: Aching;Sore Pain Intervention(s): Monitored during session;Ice applied    Home  Living Family/patient expects to be discharged to:: Private residence Living Arrangements: Spouse/significant other   Type of Home: House Home Access: Stairs to enter Entrance Stairs-Rails: Right Entrance Stairs-Number of Steps: 4-5 Home Layout: Able to live on main level with bedroom/bathroom;Two level Home Equipment: Shower seat      Prior Function Level of Independence: Independent               Hand Dominance        Extremity/Trunk Assessment   Upper Extremity Assessment Upper Extremity Assessment: Overall WFL for tasks assessed    Lower Extremity Assessment Lower Extremity Assessment: Generalized weakness    Cervical / Trunk Assessment Cervical / Trunk Assessment: Normal  Communication   Communication: No difficulties  Cognition Arousal/Alertness: Awake/alert Behavior During Therapy: WFL for tasks assessed/performed Overall Cognitive Status: Within Functional Limits for tasks assessed                                        General Comments      Exercises Total Joint Exercises Ankle Circles/Pumps: AROM;Both;10 reps;Seated Quad Sets: AROM;Both;10 reps;Seated Heel Slides: AAROM;Right;10 reps;Seated Hip ABduction/ADduction: AROM;Right;10 reps;Seated Straight Leg Raises: AROM;Right;10 reps;Supine Goniometric ROM: ~10-65 degrees   Assessment/Plan    PT Assessment Patient needs continued PT services  PT Problem List Decreased strength;Decreased mobility;Decreased range of motion;Decreased activity tolerance;Decreased balance;Decreased knowledge of use  of DME;Pain       PT Treatment Interventions DME instruction;Gait training;Therapeutic exercise;Patient/family education;Therapeutic activities;Balance training;Stair training;Functional mobility training    PT Goals (Current goals can be found in the Care Plan section)  Acute Rehab PT Goals Patient Stated Goal: regain independence PT Goal Formulation: With patient Time For Goal  Achievement: 07/09/19 Potential to Achieve Goals: Good    Frequency 7X/week   Barriers to discharge        Co-evaluation               AM-PAC PT "6 Clicks" Mobility  Outcome Measure Help needed turning from your back to your side while in a flat bed without using bedrails?: A Little Help needed moving from lying on your back to sitting on the side of a flat bed without using bedrails?: A Little Help needed moving to and from a bed to a chair (including a wheelchair)?: A Little Help needed standing up from a chair using your arms (e.g., wheelchair or bedside chair)?: A Little Help needed to walk in hospital room?: A Little Help needed climbing 3-5 steps with a railing? : A Little 6 Click Score: 18    End of Session Equipment Utilized During Treatment: Gait belt Activity Tolerance: Patient tolerated treatment well Patient left: in chair;with call bell/phone within reach;with chair alarm set   PT Visit Diagnosis: Unsteadiness on feet (R26.81);Pain;Other abnormalities of gait and mobility (R26.89) Pain - Right/Left: Right Pain - part of body: Knee    Time: [id number] PT Time Calculation (min) (ACUTE ONLY): 23 min   Charges:   PT Evaluation $PT Eval Low Complexity: 1 Low PT Treatments $Gait Training: 8-22 mins         Weston Anna, PT Acute Rehabilitation Services Pager: [phone] Office: [phone]

## 2019-06-26 NOTE — Discharge Summary (Signed)
Physician Discharge Summary   Patient ID: Amanda Curry MRN: [id number] DOB/AGE: Jan 28, 1951 68 y.o.  Admit date: 06/24/2019 Discharge date: 06/25/2019  Primary Diagnosis: Osteoarthritis, right knee  Admission Diagnoses:  Past Medical History:  Diagnosis Date  . Arthritis   . Diverticulosis   . Ganglion of left wrist    thumb joint  . Hyperlipidemia   . Hypertension   . Pre-diabetes   . Tinnitus    Discharge Diagnoses:   Principal Problem:   OA (osteoarthritis) of knee  Estimated body mass index is 22.98 kg/m as calculated from the following:   Height as of this encounter: 5\' 3"  (1.6 m).   Weight as of this encounter: 58.8 kg.  Procedure:  Procedure(s) (LRB): TOTAL KNEE ARTHROPLASTY (Right)   Consults: None  HPI: Amanda Curry is a 68 y.o. year old female with end stage OA of her right knee with progressively worsening pain and dysfunction. She has constant pain, with activity and at rest and significant functional deficits with difficulties even with ADLs. She has had extensive non-op management including analgesics, injections of cortisone and viscosupplements, and home exercise program, but remains in significant pain with significant dysfunction.Radiographs show bone on bone arthritis lateral and patellofemoral with valgus deformity. She presents now for right Total Knee Arthroplasty.    Laboratory Data: Admission on 06/24/2019, Discharged on 06/25/2019  Component Date Value Ref Range Status  . WBC 06/25/2019 9.8  4.0 - 10.5 K/uL Final  . RBC 06/25/2019 2.88* 3.87 - 5.11 MIL/uL Final  . Hemoglobin 06/25/2019 8.7* 12.0 - 15.0 g/dL Final  . HCT 06/25/2019 27.0* 36.0 - 46.0 % Final  . MCV 06/25/2019 93.8  80.0 - 100.0 fL Final  . MCH 06/25/2019 30.2  26.0 - 34.0 pg Final  . MCHC 06/25/2019 32.2  30.0 - 36.0 g/dL Final  . RDW 06/25/2019 12.8  11.5 - 15.5 % Final  . Platelets 06/25/2019 250  150 - 400 K/uL Final  . nRBC 06/25/2019 0.0  0.0 - 0.2 % Final   Performed  at [facility], [place] [address], [place], [place] [address]  . Sodium 06/25/2019 131* 135 - 145 mmol/L Final  . Potassium 06/25/2019 4.3  3.5 - 5.1 mmol/L Final  . Chloride 06/25/2019 100  98 - 111 mmol/L Final  . CO2 06/25/2019 23  22 - 32 mmol/L Final  . Glucose, Bld 06/25/2019 142* 70 - 99 mg/dL Final  . BUN 06/25/2019 15  8 - 23 mg/dL Final  . Creatinine, Ser 06/25/2019 0.80  0.44 - 1.00 mg/dL Final  . Calcium 06/25/2019 8.8* 8.9 - 10.3 mg/dL Final  . GFR calc non Af Amer 06/25/2019 >60  >60 mL/min Final  . GFR calc Af Amer 06/25/2019 >60  >60 mL/min Final  . Anion gap 06/25/2019 8  5 - 15 Final   Performed at [facility], [place] [address], [place], [place] [address]  Hospital Outpatient Visit on 06/20/2019  Component Date Value Ref Range Status  . SARS-CoV-2, NAA 06/20/2019 NOT DETECTED  NOT DETECTED Final   Comment: (NOTE) This nucleic acid amplification test was developed and its performance characteristics determined by [employer]. Nucleic acid amplification tests include PCR and TMA. This test has not been FDA cleared or approved. This test has been authorized by FDA under an Emergency Use Authorization (EUA). This test is only authorized for the duration of time the declaration that circumstances exist justifying the authorization of the emergency use of in vitro diagnostic  tests for detection of SARS-CoV-2 virus and/or diagnosis of COVID-19 infection under section 564(b)(1) of the Act, 21 U.S.C. [id number]) (1), unless the authorization is terminated or revoked sooner. When diagnostic testing is negative, the possibility of a false negative result should be considered in the context of a patient's recent exposures and the presence of clinical signs and symptoms consistent with COVID-19. An individual without symptoms of COVID- 19 and who is not shedding SARS-CoV-2 vi                          rus would expect to have a  negative (not detected) result in this assay. Performed At: [facility] [place], [place] [id number] Rush Farmer MD [id number]   . Coronavirus Source 06/20/2019 NASOPHARYNGEAL   Final   Performed at [facility], [place] [address] [place], [place] [address]  Hospital Outpatient Visit on 06/19/2019  Component Date Value Ref Range Status  . MRSA, PCR 06/19/2019 NEGATIVE  NEGATIVE Final  . Staphylococcus aureus 06/19/2019 NEGATIVE  NEGATIVE Final   Comment: (NOTE) The Xpert SA Assay (FDA approved for NASAL specimens in patients 81 years of age and older), is one component of a comprehensive surveillance program. It is not intended to diagnose infection nor to guide or monitor treatment. Performed at [facility], [place] [address] [place], [place] [address]   . Hgb A1c MFr Bld 06/19/2019 6.1* 4.8 - 5.6 % Final   Comment: (NOTE) Pre diabetes:          5.7%-6.4% Diabetes:              >6.4% Glycemic control for   <7.0% adults with diabetes   . Mean Plasma Glucose 06/19/2019 128.37  mg/dL Final   Performed at [place] [address], [place], [place] [address]  . aPTT 06/19/2019 30  24 - 36 seconds Final   Performed at [facility], [place] [address] [place], [place] [address]  . WBC 06/19/2019 7.1  4.0 - 10.5 K/uL Final  . RBC 06/19/2019 3.87  3.87 - 5.11 MIL/uL Final  . Hemoglobin 06/19/2019 11.9* 12.0 - 15.0 g/dL Final  . HCT 06/19/2019 36.0  36.0 - 46.0 % Final  . MCV 06/19/2019 93.0  80.0 - 100.0 fL Final  . MCH 06/19/2019 30.7  26.0 - 34.0 pg Final  . MCHC 06/19/2019 33.1  30.0 - 36.0 g/dL Final  . RDW 06/19/2019 12.9  11.5 - 15.5 % Final  . Platelets 06/19/2019 350  150 - 400 K/uL Final  . nRBC 06/19/2019 0.0  0.0 - 0.2 % Final  . Neutrophils Relative % 06/19/2019 76  % Final  . Neutro Abs 06/19/2019 5.4  1.7 - 7.7 K/uL Final  . Lymphocytes Relative 06/19/2019 17  % Final  . Lymphs Abs  06/19/2019 1.2  0.7 - 4.0 K/uL Final  . Monocytes Relative 06/19/2019 6  % Final  . Monocytes Absolute 06/19/2019 0.4  0.1 - 1.0 K/uL Final  . Eosinophils Relative 06/19/2019 1  % Final  . Eosinophils Absolute 06/19/2019 0.0  0.0 - 0.5 K/uL Final  . Basophils Relative 06/19/2019 0  % Final  . Basophils Absolute 06/19/2019 0.0  0.0 - 0.1 K/uL Final  . Immature Granulocytes 06/19/2019 0  % Final  . Abs Immature Granulocytes 06/19/2019 0.02  0.00 - 0.07 K/uL Final   Performed at [facility], [place] [address] [place], [place]  [address]  . Sodium 06/19/2019 128* 135 - 145 mmol/L Final  . Potassium 06/19/2019 5.1  3.5 - 5.1 mmol/L Final  . Chloride 06/19/2019 96* 98 - 111 mmol/L Final  . CO2 06/19/2019 24  22 - 32 mmol/L Final  . Glucose, Bld 06/19/2019 109* 70 - 99 mg/dL Final  . BUN 06/19/2019 26* 8 - 23 mg/dL Final  . Creatinine, Ser 06/19/2019 1.05* 0.44 - 1.00 mg/dL Final  . Calcium 06/19/2019 9.3  8.9 - 10.3 mg/dL Final  . Total Protein 06/19/2019 7.3  6.5 - 8.1 g/dL Final  . Albumin 06/19/2019 4.2  3.5 - 5.0 g/dL Final  . AST 06/19/2019 25  15 - 41 U/L Final  . ALT 06/19/2019 22  0 - 44 U/L Final  . Alkaline Phosphatase 06/19/2019 63  38 - 126 U/L Final  . Total Bilirubin 06/19/2019 0.5  0.3 - 1.2 mg/dL Final  . GFR calc non Af Amer 06/19/2019 54* >60 mL/min Final  . GFR calc Af Amer 06/19/2019 >60  >60 mL/min Final  . Anion gap 06/19/2019 8  5 - 15 Final   Performed at [facility], [place] [address], [place], [place] [address]  . Prothrombin Time 06/19/2019 13.1  11.4 - 15.2 seconds Final  . INR 06/19/2019 1.0  0.8 - 1.2 Final   Comment: (NOTE) INR goal varies based on device and disease states. Performed at [facility], [place] [address], [place], [place] [address]   . ABO/RH(D) 06/19/2019 O POS   Final  . Antibody Screen 06/19/2019 NEG   Final  . Sample Expiration 06/19/2019 06/27/2019,2359   Final  . Extend sample reason  06/19/2019    Final                   Value:NO TRANSFUSIONS OR PREGNANCY IN THE PAST 3 MONTHS Performed at [place] [address], [place], [place] [address]   . ABO/RH(D) 06/19/2019    Final                   Value:O POS Performed at [facility], [place] [address], [place], [place] [address]   Office Visit on 06/11/2019  Component Date Value Ref Range Status  . Glucose 06/11/2019 90  65 - 99 mg/dL Final  . BUN 06/11/2019 21  8 - 27 mg/dL Final  . Creatinine, Ser 06/11/2019 0.97  0.57 - 1.00 mg/dL Final  . GFR calc non Af Amer 06/11/2019 59* >59 mL/min/1.73 Final  . GFR calc Af Amer 06/11/2019 68  >59 mL/min/1.73 Final  . BUN/Creatinine Ratio 06/11/2019 22  12 - 28 Final  . Sodium 06/11/2019 128* 134 - 144 mmol/L Final  . Potassium 06/11/2019 4.7  3.5 - 5.2 mmol/L Final  . Chloride 06/11/2019 96  96 - 106 mmol/L Final  . CO2 06/11/2019 19* 20 - 29 mmol/L Final  . Calcium 06/11/2019 9.4  8.7 - 10.3 mg/dL Final  . Total Protein 06/11/2019 6.4  6.0 - 8.5 g/dL Final  . Albumin 06/11/2019 4.5  3.8 - 4.8 g/dL Final  . Globulin, Total 06/11/2019 1.9  1.5 - 4.5 g/dL Final  . Albumin/Globulin Ratio 06/11/2019 2.4* 1.2 - 2.2 Final  . Bilirubin Total 06/11/2019 0.2  0.0 - 1.2 mg/dL Final  . Alkaline Phosphatase 06/11/2019 82  39 - 117 IU/L Final  . AST 06/11/2019 23  0 - 40 IU/L Final  . ALT 06/11/2019 18  0 - 32 IU/L Final  .  WBC 06/11/2019 6.0  3.4 - 10.8 x10E3/uL Final  . RBC 06/11/2019 3.77  3.77 - 5.28 x10E6/uL Final  . Hemoglobin 06/11/2019 11.1  11.1 - 15.9 g/dL Final  . Hematocrit 06/11/2019 34.5  34.0 - 46.6 % Final  . MCV 06/11/2019 92  79 - 97 fL Final  . MCH 06/11/2019 29.4  26.6 - 33.0 pg Final  . MCHC 06/11/2019 32.2  31.5 - 35.7 g/dL Final  . RDW 06/11/2019 13.0  11.7 - 15.4 % Final  . Platelets 06/11/2019 405  150 - 450 x10E3/uL Final     X-Rays:No results found.  EKG: Orders placed or performed in visit on 06/11/19  . EKG  12-Lead     Hospital Course: Amanda Curry is a 68 y.o. who was admitted to [facility]. They were brought to the operating room on 06/24/2019 and underwent Procedure(s): TOTAL KNEE ARTHROPLASTY.  Patient tolerated the procedure well and was later transferred to the recovery room and then to the orthopaedic floor for postoperative care. They were given PO and IV analgesics for pain control following their surgery. They were given 24 hours of postoperative antibiotics of  Anti-infectives (From admission, onward)   Start     Dose/Rate Route Frequency Ordered Stop   06/24/19 2000  ceFAZolin (ANCEF) IVPB 1 g/50 mL premix     1 g 100 mL/hr over 30 Minutes Intravenous Every 6 hours 06/24/19 1842 06/25/19 0242   06/24/19 1030  ceFAZolin (ANCEF) IVPB 2g/100 mL premix     2 g 200 mL/hr over 30 Minutes Intravenous On call to O.R. 06/24/19 1015 06/24/19 1401     and started on DVT prophylaxis in the form of Aspirin.   PT and OT were ordered for total joint protocol. Discharge planning consulted to help with postop disposition and equipment needs.  Patient had a good  night on the evening of surgery. They started to get up OOB with therapy on POD #1. Pt was seen during rounds and was ready to go home pending progress with therapy. Hemovac drain was pulled without difficulty. She worked with therapy on POD #1 and was meeting her goals. Pt was discharged to home later that day in stable condition.  Diet: Regular diet Activity: WBAT Follow-up: in 2 weeks Disposition: Home Discharged Condition: good   Discharge Instructions    Call MD / Call 911   Complete by: As directed    If you experience chest pain or shortness of breath, CALL 911 and be transported to the hospital emergency room.  If you develope a fever above 101 F, pus (white drainage) or increased drainage or redness at the wound, or calf pain, call your surgeon's office.   Change dressing   Complete by: As directed    Change dressing  on Wednesday, then change the dressing daily with sterile 4 x 4 inch gauze dressing and apply TED hose.   Constipation Prevention   Complete by: As directed    Drink plenty of fluids.  Prune juice may be helpful.  You may use a stool softener, such as Colace (over the counter) 100 mg twice a day.  Use MiraLax (over the counter) for constipation as needed.   Diet - low sodium heart healthy   Complete by: As directed    Discharge instructions   Complete by: As directed    Dr. Gaynelle Arabian Total Joint Specialist [facility] [address], [place], [place] [address] [phone]  TOTAL KNEE REPLACEMENT POSTOPERATIVE  DIRECTIONS  Knee Rehabilitation, Guidelines Following Surgery  Results after knee surgery are often greatly improved when you follow the exercise, range of motion and muscle strengthening exercises prescribed by your doctor. Safety measures are also important to protect the knee from further injury. Any time any of these exercises cause you to have increased pain or swelling in your knee joint, decrease the amount until you are comfortable again and slowly increase them. If you have problems or questions, call your caregiver or physical therapist for advice.   HOME CARE INSTRUCTIONS  Remove items at home which could result in a fall. This includes throw rugs or furniture in walking pathways.  ICE to the affected knee every three hours for 30 minutes at a time and then as needed for pain and swelling.  Continue to use ice on the knee for pain and swelling from surgery. You may notice swelling that will progress down to the foot and ankle.  This is normal after surgery.  Elevate the leg when you are not up walking on it.   Continue to use the breathing machine which will help keep your temperature down.  It is common for your temperature to cycle up and down following surgery, especially at night when you are not up moving around and exerting yourself.  The breathing  machine keeps your lungs expanded and your temperature down. Do not place pillow under knee, focus on keeping the knee straight while resting   DIET You may resume your previous home diet once your are discharged from the hospital.  DRESSING / WOUND CARE / SHOWERING You may change your dressing 3-5 days after surgery.  Then change the dressing every day with sterile gauze.  Please use good hand washing techniques before changing the dressing.  Do not use any lotions or creams on the incision until instructed by your surgeon. You may start showering once you are discharged home but do not submerge the incision under water. Just pat the incision dry and apply a dry gauze dressing on daily. Change the surgical dressing daily and reapply a dry dressing each time.  ACTIVITY Walk with your walker as instructed. Use walker as long as suggested by your caregivers. Avoid periods of inactivity such as sitting longer than an hour when not asleep. This helps prevent blood clots.  You may resume a sexual relationship in one month or when given the OK by your doctor.  You may return to work once you are cleared by your doctor.  Do not drive a car for 6 weeks or until released by you surgeon.  Do not drive while taking narcotics.  WEIGHT BEARING Weight bearing as tolerated with assist device (walker, cane, etc) as directed, use it as long as suggested by your surgeon or therapist, typically at least 4-6 weeks.  POSTOPERATIVE CONSTIPATION PROTOCOL Constipation - defined medically as fewer than three stools per week and severe constipation as less than one stool per week.  One of the most common issues patients have following surgery is constipation.  Even if you have a regular bowel pattern at home, your normal regimen is likely to be disrupted due to multiple reasons following surgery.  Combination of anesthesia, postoperative narcotics, change in appetite and fluid intake all can affect your bowels.  In  order to avoid complications following surgery, here are some recommendations in order to help you during your recovery period.  Colace (docusate) - Pick up an over-the-counter form of Colace or another stool softener and  take twice a day as long as you are requiring postoperative pain medications.  Take with a full glass of water daily.  If you experience loose stools or diarrhea, hold the colace until you stool forms back up.  If your symptoms do not get better within 1 week or if they get worse, check with your doctor.  Dulcolax (bisacodyl) - Pick up over-the-counter and take as directed by the product packaging as needed to assist with the movement of your bowels.  Take with a full glass of water.  Use this product as needed if not relieved by Colace only.   MiraLax (polyethylene glycol) - Pick up over-the-counter to have on hand.  MiraLax is a solution that will increase the amount of water in your bowels to assist with bowel movements.  Take as directed and can mix with a glass of water, juice, soda, coffee, or tea.  Take if you go more than two days without a movement. Do not use MiraLax more than once per day. Call your doctor if you are still constipated or irregular after using this medication for 7 days in a row.  If you continue to have problems with postoperative constipation, please contact the office for further assistance and recommendations.  If you experience "the worst abdominal pain ever" or develop nausea or vomiting, please contact the office immediatly for further recommendations for treatment.  ITCHING  If you experience itching with your medications, try taking only a single pain pill, or even half a pain pill at a time.  You can also use Benadryl over the counter for itching or also to help with sleep.   TED HOSE STOCKINGS Wear the elastic stockings on both legs for three weeks following surgery during the day but you may remove then at night for sleeping.  MEDICATIONS See  your medication summary on the "After Visit Summary" that the nursing staff will review with you prior to discharge.  You may have some home medications which will be placed on hold until you complete the course of blood thinner medication.  It is important for you to complete the blood thinner medication as prescribed by your surgeon.  Continue your approved medications as instructed at time of discharge.  PRECAUTIONS If you experience chest pain or shortness of breath - call 911 immediately for transfer to the hospital emergency department.  If you develop a fever greater that 101 F, purulent drainage from wound, increased redness or drainage from wound, foul odor from the wound/dressing, or calf pain - CONTACT YOUR SURGEON.                                                   FOLLOW-UP APPOINTMENTS Make sure you keep all of your appointments after your operation with your surgeon and caregivers. You should call the office at the above phone number and make an appointment for approximately two weeks after the date of your surgery or on the date instructed by your surgeon outlined in the "After Visit Summary".   RANGE OF MOTION AND STRENGTHENING EXERCISES  Rehabilitation of the knee is important following a knee injury or an operation. After just a few days of immobilization, the muscles of the thigh which control the knee become weakened and shrink (atrophy). Knee exercises are designed to build up the tone and strength of the thigh  muscles and to improve knee motion. Often times heat used for twenty to thirty minutes before working out will loosen up your tissues and help with improving the range of motion but do not use heat for the first two weeks following surgery. These exercises can be done on a training (exercise) mat, on the floor, on a table or on a bed. Use what ever works the best and is most comfortable for you Knee exercises include:  Leg Lifts - While your knee is still immobilized in a  splint or cast, you can do straight leg raises. Lift the leg to 60 degrees, hold for 3 sec, and slowly lower the leg. Repeat 10-20 times 2-3 times daily. Perform this exercise against resistance later as your knee gets better.  Quad and Hamstring Sets - Tighten up the muscle on the front of the thigh (Quad) and hold for 5-10 sec. Repeat this 10-20 times hourly. Hamstring sets are done by pushing the foot backward against an object and holding for 5-10 sec. Repeat as with quad sets.  Leg Slides: Lying on your back, slowly slide your foot toward your buttocks, bending your knee up off the floor (only go as far as is comfortable). Then slowly slide your foot back down until your leg is flat on the floor again. Angel Wings: Lying on your back spread your legs to the side as far apart as you can without causing discomfort.  A rehabilitation program following serious knee injuries can speed recovery and prevent re-injury in the future due to weakened muscles. Contact your doctor or a physical therapist for more information on knee rehabilitation.   IF YOU ARE TRANSFERRED TO A SKILLED REHAB FACILITY If the patient is transferred to a skilled rehab facility following release from the hospital, a list of the current medications will be sent to the facility for the patient to continue.  When discharged from the skilled rehab facility, please have the facility set up the patient's [place] prior to being released. Also, the skilled facility will be responsible for providing the patient with their medications at time of release from the facility to include their pain medication, the muscle relaxants, and their blood thinner medication. If the patient is still at the rehab facility at time of the two week follow up appointment, the skilled rehab facility will also need to assist the patient in arranging follow up appointment in our office and any transportation needs.  MAKE SURE YOU:  Understand  these instructions.  Get help right away if you are not doing well or get worse.    Pick up stool softner and laxative for home use following surgery while on pain medications. Do not submerge incision under water. Please use good hand washing techniques while changing dressing each day. May shower starting three days after surgery. Please use a clean towel to pat the incision dry following showers. Continue to use ice for pain and swelling after surgery. Do not use any lotions or creams on the incision until instructed by your surgeon.   Do not put a pillow under the knee. Place it under the heel.   Complete by: As directed    Driving restrictions   Complete by: As directed    No driving for two weeks   TED hose   Complete by: As directed    Use stockings (TED hose) for three weeks on both leg(s).  You may remove them at night for sleeping.   Weight bearing  as tolerated   Complete by: As directed      Allergies as of 06/25/2019      Reactions   Hctz [hydrochlorothiazide] Other (See Comments)   Causes extreme hyponatremia quickly - 126   Novocain [procaine]    "makes me tremble"    Amlodipine Swelling   Mild but chronic lower extremity edema      Medication List    STOP taking these medications   ibuprofen 200 MG tablet Commonly known as: ADVIL     TAKE these medications   acetaminophen 500 MG tablet Commonly known as: TYLENOL Take 1,000 mg by mouth every 6 (six) hours as needed for moderate pain or headache.   aspirin 325 MG EC tablet Take 1 tablet (325 mg total) by mouth 2 (two) times daily for 20 days. Take one tablet (325 mg) Aspirin two times a day for three weeks following surgery.Then take one baby Aspirin (81 mg) once a day for three weeks.Then discontinue aspirin.   estradiol 0.1 MG/GM vaginal cream Commonly known as: ESTRACE Place 1 Applicatorful vaginally once a week.   famotidine 20 MG tablet Commonly known as: PEPCID Take 20 mg by mouth daily as  needed for heartburn or indigestion.   gabapentin 100 MG capsule Commonly known as: NEURONTIN Take 2 capsules (200 mg total) by mouth 3 (three) times daily. Take two 100 mg capsules three times a day for two weeks following surgery.Then take two 100 mg capsules two times a day for two weeks. Then take two 100 mg capsules once a day for two weeks.Then discontinue the Gabapentin.   loperamide 2 MG tablet Commonly known as: IMODIUM A-D Take 1-2 mg by mouth 4 (four) times daily as needed for diarrhea or loose stools.   Melatonin 3 MG Tabs Take 3 mg by mouth at bedtime as needed (sleep).   methocarbamol 500 MG tablet Commonly known as: ROBAXIN Take 1 tablet (500 mg total) by mouth every 6 (six) hours as needed for muscle spasms.   olmesartan 40 MG tablet Commonly known as: BENICAR Take 1 tablet (40 mg total) by mouth daily.   oxyCODONE 5 MG immediate release tablet Commonly known as: Oxy IR/ROXICODONE Take 1-2 tablets (5-10 mg total) by mouth every 6 (six) hours as needed for severe pain.   Pitavastatin Calcium 2 MG Tabs Commonly known as: Livalo Take 0.5 tablets (1 mg total) by mouth daily.   pseudoephedrine 30 MG tablet Commonly known as: SUDAFED Take 30 mg by mouth daily as needed for congestion.   saccharomyces boulardii 250 MG capsule Commonly known as: FLORASTOR Take 250 mg by mouth 2 (two) times daily.   spironolactone 50 MG tablet Commonly known as: ALDACTONE Take 1 tablet (50 mg total) by mouth daily.   SYSTANE OP Place 1 drop into both eyes daily as needed (dry eyes).   traMADol 50 MG tablet Commonly known as: ULTRAM Take 1-2 tablets (50-100 mg total) by mouth every 6 (six) hours as needed for moderate pain.   triamcinolone cream 0.1 % Commonly known as: KENALOG Apply 1 application topically daily as needed (eczema).            Discharge Care Instructions  (From admission, onward)         Start     Ordered   06/25/19 0000  Weight bearing as  tolerated     06/25/19 0741   06/25/19 0000  Change dressing    Comments: Change dressing on Wednesday, then change the dressing daily with  sterile 4 x 4 inch gauze dressing and apply TED hose.   06/25/19 0741         Follow-up Information    Gaynelle Arabian, MD. Schedule an appointment as soon as possible for a visit on 07/09/2019.   Specialty: Orthopedic Surgery Contact information: [address] [place] [address] [id number]           Signed: Griffith Citron, PA-C Orthopedic Surgery 06/26/2019, 2:40 PM

## 2019-06-28 DIAGNOSIS — M25661 Stiffness of right knee, not elsewhere classified: Secondary | ICD-10-CM | POA: Diagnosis not present

## 2019-07-01 DIAGNOSIS — M25561 Pain in right knee: Secondary | ICD-10-CM | POA: Diagnosis not present

## 2019-07-04 ENCOUNTER — Encounter: Payer: Self-pay | Admitting: Family Medicine

## 2019-07-05 DIAGNOSIS — M25561 Pain in right knee: Secondary | ICD-10-CM | POA: Diagnosis not present

## 2019-07-08 DIAGNOSIS — M25561 Pain in right knee: Secondary | ICD-10-CM | POA: Diagnosis not present

## 2019-07-10 ENCOUNTER — Other Ambulatory Visit: Payer: Self-pay

## 2019-07-10 ENCOUNTER — Ambulatory Visit (INDEPENDENT_AMBULATORY_CARE_PROVIDER_SITE_OTHER): Payer: PPO | Admitting: Family Medicine

## 2019-07-10 ENCOUNTER — Encounter: Payer: Self-pay | Admitting: Family Medicine

## 2019-07-10 VITALS — BP 103/64 | HR 60 | Temp 98.0°F | Ht 62.0 in | Wt 128.4 lb

## 2019-07-10 DIAGNOSIS — I10 Essential (primary) hypertension: Secondary | ICD-10-CM | POA: Diagnosis not present

## 2019-07-10 DIAGNOSIS — E871 Hypo-osmolality and hyponatremia: Secondary | ICD-10-CM | POA: Diagnosis not present

## 2019-07-10 DIAGNOSIS — M25561 Pain in right knee: Secondary | ICD-10-CM | POA: Diagnosis not present

## 2019-07-10 DIAGNOSIS — D62 Acute posthemorrhagic anemia: Secondary | ICD-10-CM | POA: Diagnosis not present

## 2019-07-10 NOTE — Patient Instructions (Addendum)
  1.Stop the olmesartan for a month. If you stop the olmesartan and your blood pressure increases above 150/100 call the office for a medication update.  2. Continue with physical therapy  3. Return to clinic in one month to get a blood pressure check and lab check   If you have lab work done today you will be contacted with your lab results within the next 2 weeks.  If you have not heard from [place] then please contact us. The fastest way to get your results is to register for My Chart.   IF you received an x-ray today, you will receive an invoice from [facility]. Please contact [facility] at [phone] with questions or concerns regarding your invoice.   IF you received labwork today, you will receive an invoice from [place]. Please contact [facility] at [phone] with questions or concerns regarding your invoice.   Our billing staff will not be able to assist you with questions regarding bills from these companies.  You will be contacted with the lab results as soon as they are available. The fastest way to get your results is to activate your My Chart account. Instructions are located on the last page of this paperwork. If you have not heard from [place] regarding the results in 2 weeks, please contact this office.

## 2019-07-10 NOTE — Progress Notes (Signed)
Established Patient Office Visit  Subjective:  Patient ID: Amanda Curry, female    DOB: 12/08/1950  Age: 68 y.o. MRN: [id number]  CC:  Chief Complaint  Patient presents with  . blood work result    f/u and possible repeat blood work. 1 m f/u     HPI Amanda Curry presents for   Anemia Pt had right knee surgery 06/24/2019 She had a drop in her hemoglobin and hyponatremia on her postop labs She had bleeding in the OR but did not require a transfusion Lab Results  Component Value Date   WBC 9.8 06/25/2019   HGB 8.7 (L) 06/25/2019   HCT 27.0 (L) 06/25/2019   MCV 93.8 06/25/2019   PLT 250 06/25/2019   Hypocalcemia She also noted to have hypocalcemia She denies muscle cramps or palpitation She just does not feel  Like eating much She has lost a pound since surgery  Hyponatremia Currently she is taking aspirin bid as instructed and started PT with range of motion exercises She reports that she feels low energy and poor appetite She denies any falls She uses a walker Her pain is manageable  Hypertension BP Readings from Last 3 Encounters:  07/10/19 103/64  06/25/19 101/68  06/19/19 120/80   She reports that she is not eating much but is taking her olmesartan and spironolactone She denies chest pains, palpitations or shortness of breath  Past Medical History:  Diagnosis Date  . Arthritis   . Diverticulosis   . Ganglion of left wrist    thumb joint  . Hyperlipidemia   . Hypertension   . Pre-diabetes   . Tinnitus     Past Surgical History:  Procedure Laterality Date  . COLONOSCOPY     x 3  . TONSILLECTOMY    . TOTAL KNEE ARTHROPLASTY Right 06/24/2019   Procedure: TOTAL KNEE ARTHROPLASTY;  Surgeon: Gaynelle Arabian, MD;  Location: [facility];  Service: Orthopedics;  Laterality: Right;  73mn  . TUBAL LIGATION      Family History  Problem Relation Age of Onset  . Colon cancer Maternal Grandmother   . Cancer Maternal Grandmother        colon  . Prostate  cancer Father   . Cancer Father        prostate  . Alzheimer's disease Mother   . Cancer Mother   . Hyperlipidemia Mother   . Hypertension Mother   . Heart disease Paternal Grandmother   . Heart disease Paternal Grandfather   . Hypertension Sister     Social History   Socioeconomic History  . Marital status: Married    Spouse name: CJuanda Crumble . Number of children: 4  . Years of education: Not on file  . Highest education level: Not on file  Occupational History  . 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 Financial resource strain: Not on file  . Food insecurity    Worry: Not on file    Inability: Not on file  . Transportation needs    Medical: Not on file    Non-medical: Not on file  Tobacco Use  . Smoking status: Never Smoker  . Smokeless tobacco: Never Used  Substance and Sexual Activity  . Alcohol use: Yes    Comment: rarely; 1-2x/mo  . Drug use: No  . Sexual activity: Yes    Birth control/protection: Post-menopausal, Surgical  Lifestyle  . Physical activity    Days per week: Not on file  Minutes per session: Not on file  . Stress: Not on file  Relationships  . Social Herbalist on phone: Not on file    Gets together: Not on file    Attends religious service: Not on file    Active member of club or organization: Not on file    Attends meetings of clubs or organizations: Not on file    Relationship status: Not on file  . Intimate partner violence    Fear of current or ex partner: Not on file    Emotionally abused: Not on file    Physically abused: Not on file    Forced sexual activity: Not on file  Other Topics Concern  . Not on file  Social History Narrative   Lives with her husband.   Adult children. Two live in [place].    Outpatient Medications Prior to Visit  Medication Sig Dispense Refill  . aspirin EC 325 MG EC tablet Take 1 tablet (325 mg total) by mouth 2 (two) times daily for 20 days.  Take one tablet (325 mg) Aspirin two times a day for three weeks following surgery.Then take one baby Aspirin (81 mg) once a day for three weeks.Then discontinue aspirin. 40 tablet 0  . famotidine (PEPCID) 20 MG tablet Take 20 mg by mouth daily as needed for heartburn or indigestion.    . gabapentin (NEURONTIN) 100 MG capsule Take 2 capsules (200 mg total) by mouth 3 (three) times daily. Take two 100 mg capsules three times a day for two weeks following surgery.Then take two 100 mg capsules two times a day for two weeks. Then take two 100 mg capsules once a day for two weeks.Then discontinue the Gabapentin. 168 capsule 0  . loperamide (IMODIUM A-D) 2 MG tablet Take 1-2 mg by mouth 4 (four) times daily as needed for diarrhea or loose stools.     . Melatonin 3 MG TABS Take 3 mg by mouth at bedtime as needed (sleep).     . methocarbamol (ROBAXIN) 500 MG tablet Take 1 tablet (500 mg total) by mouth every 6 (six) hours as needed for muscle spasms. 40 tablet 0  . olmesartan (BENICAR) 40 MG tablet Take 1 tablet (40 mg total) by mouth daily. 90 tablet 1  . oxyCODONE (OXY IR/ROXICODONE) 5 MG immediate release tablet Take 1-2 tablets (5-10 mg total) by mouth every 6 (six) hours as needed for severe pain. 56 tablet 0  . Pitavastatin Calcium (LIVALO) 2 MG TABS Take 0.5 tablets (1 mg total) by mouth daily. 45 tablet 1  . Polyethyl Glycol-Propyl Glycol (SYSTANE OP) Place 1 drop into both eyes daily as needed (dry eyes).    . pseudoephedrine (SUDAFED) 30 MG tablet Take 30 mg by mouth daily as needed for congestion.    Marland Kitchen spironolactone (ALDACTONE) 50 MG tablet Take 1 tablet (50 mg total) by mouth daily. 90 tablet 1  . traMADol (ULTRAM) 50 MG tablet Take 1-2 tablets (50-100 mg total) by mouth every 6 (six) hours as needed for moderate pain. 40 tablet 0  . triamcinolone cream (KENALOG) 0.1 % Apply 1 application topically daily as needed (eczema).     Marland Kitchen acetaminophen (TYLENOL) 500 MG tablet Take 1,000 mg by mouth every 6  (six) hours as needed for moderate pain or headache.    . estradiol (ESTRACE) 0.1 MG/GM vaginal cream Place 1 Applicatorful vaginally once a week.   0  . saccharomyces boulardii (FLORASTOR) 250 MG capsule Take 250 mg by mouth  2 (two) times daily.     No facility-administered medications prior to visit.     Allergies  Allergen Reactions  . Hctz [Hydrochlorothiazide] Other (See Comments)    Causes extreme hyponatremia quickly - 126  . Novocain [Procaine]     "makes me tremble"   . Amlodipine Swelling    Mild but chronic lower extremity edema    ROS Review of Systems Review of Systems  Constitutional: Negative for activity change, appetite change, chills and fever. +fatigue and lethargy, + lack of appetite HENT: Negative for congestion, nosebleeds, trouble swallowing and voice change.   Respiratory: Negative for cough, shortness of breath and wheezing.   Gastrointestinal: Negative for diarrhea, nausea and vomiting.  Genitourinary: Negative for difficulty urinating, dysuria, flank pain and hematuria.  Musculoskeletal: Negative for back pain, joint swelling and neck pain.  Neurological: Negative for dizziness, speech difficulty, light-headedness and numbness.  See HPI. All other review of systems negative.     Objective:    Physical Exam  BP 103/64   Pulse 60   Temp 98 F (36.7 C) (Oral)   Ht _0  (1.575 m)   Wt 128 lb 6.4 oz (58.2 kg)   SpO2 95%   BMI 23.48 kg/m  Wt Readings from Last 3 Encounters:  07/10/19 128 lb 6.4 oz (58.2 kg)  06/24/19 129 lb 11.2 oz (58.8 kg)  06/19/19 129 lb 11.2 oz (58.8 kg)   Physical Exam  Constitutional: Oriented to person, place, and time. Appears well-developed and well-nourished.  HENT:  Head: Normocephalic and atraumatic.  Eyes: Conjunctivae and EOM are normal.  Cardiovascular: Normal rate, regular rhythm, normal heart sounds and intact distal pulses.  No murmur heard. Pulmonary/Chest: Effort normal and breath sounds normal. No  stridor. No respiratory distress. Has no wheezes.  Neurological: Is alert and oriented to person, place, and time.  Skin: Skin is warm. Capillary refill takes less than 2 seconds.  Psychiatric: Has a normal mood and affect. Behavior is normal. Judgment and thought content normal.    Health Maintenance Due  Topic Date Due  . MAMMOGRAM  08/03/2018    There are no preventive care reminders to display for this patient.  Lab Results  Component Value Date   TSH 3.730 09/11/2018   Lab Results  Component Value Date   WBC 9.8 06/25/2019   HGB 8.7 (L) 06/25/2019   HCT 27.0 (L) 06/25/2019   MCV 93.8 06/25/2019   PLT 250 06/25/2019   Lab Results  Component Value Date   NA 131 (L) 06/25/2019   K 4.3 06/25/2019   CO2 23 06/25/2019   GLUCOSE 142 (H) 06/25/2019   BUN 15 06/25/2019   CREATININE 0.80 06/25/2019   BILITOT 0.5 06/19/2019   ALKPHOS 63 06/19/2019   AST 25 06/19/2019   ALT 22 06/19/2019   PROT 7.3 06/19/2019   ALBUMIN 4.2 06/19/2019   CALCIUM 8.8 (L) 06/25/2019   ANIONGAP 8 06/25/2019   Lab Results  Component Value Date   CHOL 196 07/23/2018   Lab Results  Component Value Date   HDL 61 07/23/2018   Lab Results  Component Value Date   LDLCALC 105 (H) 07/23/2018   Lab Results  Component Value Date   TRIG 149 07/23/2018   Lab Results  Component Value Date   CHOLHDL 3.2 07/23/2018   Lab Results  Component Value Date   HGBA1C 6.1 (H) 06/19/2019      Assessment & Plan:   Problem List Items Addressed This Visit  Cardiovascular and Mediastinum   HTN (hypertension)    Other Visit Diagnoses    Hyponatremia    -  Primary   Relevant Orders   CMP14+EGFR   CMP14+EGFR   Hypocalcemia       Relevant Orders   CMP14+EGFR   PTH, Intact and Calcium   CMP14+EGFR   Acute blood loss anemia       Relevant Orders   CBC     Anemia Pt with anemia postoperatively Discussed that if the hemoglobin is less than 10 will need iron supplementation   Hypocalcemia Will check levels  Could be a med side effect Will also check kidney function and pth  Hyponatremia Present since surgery If it is persistent will change bp meds   No orders of the defined types were placed in this encounter.   Follow-up: Return in about 4 weeks (around 08/07/2019), or nurse visit for blood pressure and to check labs.    Forrest Moron, MD

## 2019-07-11 LAB — CMP14+EGFR
ALT: 22 IU/L (ref 0–32)
AST: 28 IU/L (ref 0–40)
Albumin/Globulin Ratio: 1.9 (ref 1.2–2.2)
Albumin: 4.1 g/dL (ref 3.8–4.8)
Alkaline Phosphatase: 129 IU/L — ABNORMAL HIGH (ref 39–117)
BUN/Creatinine Ratio: 28 (ref 12–28)
BUN: 26 mg/dL (ref 8–27)
Bilirubin Total: <0.2 mg/dL (ref 0.0–1.2)
CO2: 23 mmol/L (ref 20–29)
Calcium: 9.2 mg/dL (ref 8.7–10.3)
Chloride: 93 mmol/L — ABNORMAL LOW (ref 96–106)
Creatinine, Ser: 0.94 mg/dL (ref 0.57–1.00)
GFR calc Af Amer: 71 mL/min/{1.73_m2} (ref >59)
GFR calc non Af Amer: 62 mL/min/{1.73_m2} (ref >59)
Globulin, Total: 2.2 g/dL (ref 1.5–4.5)
Glucose: 129 mg/dL — ABNORMAL HIGH (ref 65–99)
Potassium: 5 mmol/L (ref 3.5–5.2)
Sodium: 129 mmol/L — ABNORMAL LOW (ref 134–144)
Total Protein: 6.3 g/dL (ref 6.0–8.5)

## 2019-07-11 LAB — PTH, INTACT AND CALCIUM: PTH: 31 pg/mL (ref 15–65)

## 2019-07-11 LAB — CBC
Hematocrit: 30.7 % — ABNORMAL LOW (ref 34.0–46.6)
Hemoglobin: 10.2 g/dL — ABNORMAL LOW (ref 11.1–15.9)
MCH: 30.1 pg (ref 26.6–33.0)
MCHC: 33.2 g/dL (ref 31.5–35.7)
MCV: 91 fL (ref 79–97)
Platelets: 643 10*3/uL — ABNORMAL HIGH (ref 150–450)
RBC: 3.39 x10E6/uL — ABNORMAL LOW (ref 3.77–5.28)
RDW: 12.8 % (ref 11.7–15.4)
WBC: 8.5 10*3/uL (ref 3.4–10.8)

## 2019-07-12 DIAGNOSIS — M25561 Pain in right knee: Secondary | ICD-10-CM | POA: Diagnosis not present

## 2019-07-12 NOTE — Telephone Encounter (Signed)
Copied from [place] [phone]. Topic: General - Other >> Jul 12, 2019  9:34 AM Rainey Pines A wrote: Patient would like a callback from nurse to go over lab results

## 2019-07-15 DIAGNOSIS — M25561 Pain in right knee: Secondary | ICD-10-CM | POA: Diagnosis not present

## 2019-07-16 ENCOUNTER — Telehealth: Payer: Self-pay | Admitting: Family Medicine

## 2019-07-16 NOTE — Telephone Encounter (Signed)
Copied from [place] [phone]. Topic: General - Other >> Jul 16, 2019  3:24 PM Greggory Keen D wrote: Reason for CRM: pt called wanting to know about her lab results.  Pt was expecting to get a call back but she has not heard anything. Best CB#  [phone]

## 2019-07-16 NOTE — Telephone Encounter (Signed)
Copied from [place] [id number]. Topic: General - Other >> Jul 16, 2019  3:24 PM Greggory Keen D wrote: Reason for CRM: pt called wanting to know about her lab results.  Pt was expecting to get a call back but she has not heard anything. Best CB#  [phone]

## 2019-07-17 DIAGNOSIS — M25561 Pain in right knee: Secondary | ICD-10-CM | POA: Diagnosis not present

## 2019-07-19 DIAGNOSIS — M25561 Pain in right knee: Secondary | ICD-10-CM | POA: Diagnosis not present

## 2019-07-21 ENCOUNTER — Other Ambulatory Visit: Payer: Self-pay | Admitting: Family Medicine

## 2019-07-21 DIAGNOSIS — I10 Essential (primary) hypertension: Secondary | ICD-10-CM

## 2019-07-23 DIAGNOSIS — M25561 Pain in right knee: Secondary | ICD-10-CM | POA: Diagnosis not present

## 2019-07-30 DIAGNOSIS — Z96651 Presence of right artificial knee joint: Secondary | ICD-10-CM | POA: Diagnosis not present

## 2019-07-30 DIAGNOSIS — M25561 Pain in right knee: Secondary | ICD-10-CM | POA: Diagnosis not present

## 2019-07-30 DIAGNOSIS — Z471 Aftercare following joint replacement surgery: Secondary | ICD-10-CM | POA: Diagnosis not present

## 2019-08-01 DIAGNOSIS — M25561 Pain in right knee: Secondary | ICD-10-CM | POA: Diagnosis not present

## 2019-08-06 DIAGNOSIS — M25561 Pain in right knee: Secondary | ICD-10-CM | POA: Diagnosis not present

## 2019-08-12 DIAGNOSIS — M25561 Pain in right knee: Secondary | ICD-10-CM | POA: Diagnosis not present

## 2019-08-13 DIAGNOSIS — N958 Other specified menopausal and perimenopausal disorders: Secondary | ICD-10-CM | POA: Diagnosis not present

## 2019-08-13 DIAGNOSIS — Z1231 Encounter for screening mammogram for malignant neoplasm of breast: Secondary | ICD-10-CM | POA: Diagnosis not present

## 2019-08-13 DIAGNOSIS — Z124 Encounter for screening for malignant neoplasm of cervix: Secondary | ICD-10-CM | POA: Diagnosis not present

## 2019-08-13 DIAGNOSIS — Z6824 Body mass index (BMI) 24.0-24.9, adult: Secondary | ICD-10-CM | POA: Diagnosis not present

## 2019-08-13 DIAGNOSIS — M8588 Other specified disorders of bone density and structure, other site: Secondary | ICD-10-CM | POA: Diagnosis not present

## 2019-08-13 DIAGNOSIS — M1990 Unspecified osteoarthritis, unspecified site: Secondary | ICD-10-CM | POA: Insufficient documentation

## 2019-08-14 ENCOUNTER — Ambulatory Visit (INDEPENDENT_AMBULATORY_CARE_PROVIDER_SITE_OTHER): Payer: PPO | Admitting: Family Medicine

## 2019-08-14 ENCOUNTER — Other Ambulatory Visit: Payer: Self-pay

## 2019-08-14 ENCOUNTER — Telehealth (INDEPENDENT_AMBULATORY_CARE_PROVIDER_SITE_OTHER): Payer: PPO | Admitting: Family Medicine

## 2019-08-14 DIAGNOSIS — I10 Essential (primary) hypertension: Secondary | ICD-10-CM | POA: Diagnosis not present

## 2019-08-14 DIAGNOSIS — E871 Hypo-osmolality and hyponatremia: Secondary | ICD-10-CM

## 2019-08-14 MED ORDER — TRIAMCINOLONE ACETONIDE 0.1 % EX CREA: 1.0000 "application " | TOPICAL_CREAM | Freq: Every day | CUTANEOUS | 1 refills | Status: DC | PRN

## 2019-08-14 NOTE — Patient Instructions (Signed)
° ° ° °  If you have lab work done today you will be contacted with your lab results within the next 2 weeks.  If you have not heard from us then please contact us. The fastest way to get your results is to register for My Chart. ° ° °IF you received an x-ray today, you will receive an invoice from [facility]. Please contact [facility] at [phone] with questions or concerns regarding your invoice.  ° °IF you received labwork today, you will receive an invoice from [facility]. Please contact [facility] at [phone] with questions or concerns regarding your invoice.  ° °Our billing staff will not be able to assist you with questions regarding bills from these companies. ° °You will be contacted with the lab results as soon as they are available. The fastest way to get your results is to activate your My Chart account. Instructions are located on the last page of this paperwork. If you have not heard from us regarding the results in 2 weeks, please contact this office. °  ° ° ° °

## 2019-08-14 NOTE — Progress Notes (Signed)
CC: 1 month f/u sodium levels and hypertension. Pt would like clarification on lab results.  Pt requesting refill on triamcinolone cream.  No travel outside [place] or [place] in the past 3 weeks.  No recent weight or bp taken.

## 2019-08-14 NOTE — Progress Notes (Signed)
Telemedicine Encounter- SOAP NOTE Established Patient  This telephone encounter was conducted with the patient's (or proxy's) verbal consent via audio telecommunications: yes/no: Yes Patient was instructed to have this encounter in a suitably private space; and to only have persons present to whom they give permission to participate. In addition, patient identity was confirmed by use of name plus two identifiers (DOB and address).  I discussed the limitations, risks, security and privacy concerns of performing an evaluation and management service by telephone and the availability of in person appointments. I also discussed with the patient that there may be a patient responsible charge related to this service. The patient expressed understanding and agreed to proceed.  I spent a total of TIME; 0 MIN TO 60 MIN: 20 minutes talking with the patient or their proxy.  Chief Complaint  Patient presents with  . sodium level/hypertension    1 month f/u, pt wants clarification on lab results got results in mail but was unsure what results meant  . Medication Refill    triamcinolone cream    Subjective   Amanda Curry is a 68 y.o. established patient. Telephone visit today for  HPI  She has a history of hypertension and reports that she is only on the spironolactone She gets some fatigue but denies any dizziness She reports that she is taking the spironolactone 50mg  for her bp She only drinks when thirsty and not excessively since she has severe pelvic organ prolopse and wears a pessary She denies any tremors.     Patient Active Problem List   Diagnosis Date Noted  . OA (osteoarthritis) of knee 06/24/2019  . Weight loss, unintentional 07/28/2018  . Prediabetes 01/26/2018  . Abnormal thyroid blood test 01/26/2018  . Asymptomatic microscopic hematuria 01/26/2018  . Female cystocele 01/26/2018  . HTN (hypertension) 03/21/2013  . Hyperlipidemia 03/21/2013    Past Medical History:    Diagnosis Date  . Arthritis   . Diverticulosis   . Ganglion of left wrist    thumb joint  . Hyperlipidemia   . Hypertension   . Pre-diabetes   . Tinnitus     Current Outpatient Medications  Medication Sig Dispense Refill  . acetaminophen (TYLENOL) 500 MG tablet Take 1,000 mg by mouth every 6 (six) hours as needed for moderate pain or headache.    . estradiol (ESTRACE) 0.1 MG/GM vaginal cream Place 1 Applicatorful vaginally once a week.   0  . famotidine (PEPCID) 20 MG tablet Take 20 mg by mouth daily as needed for heartburn or indigestion.    Marland Kitchen ibuprofen (ADVIL) 200 MG tablet Take 200 mg by mouth every 6 (six) hours as needed.    . loperamide (IMODIUM A-D) 2 MG tablet Take 1-2 mg by mouth 4 (four) times daily as needed for diarrhea or loose stools.     . Melatonin 3 MG TABS Take 3 mg by mouth at bedtime as needed (sleep).     . methocarbamol (ROBAXIN) 500 MG tablet Take 1 tablet (500 mg total) by mouth every 6 (six) hours as needed for muscle spasms. 40 tablet 0  . Multiple Vitamin (MULTI VITAMIN DAILY PO) Take by mouth.    . oxyCODONE (OXY IR/ROXICODONE) 5 MG immediate release tablet Take 1-2 tablets (5-10 mg total) by mouth every 6 (six) hours as needed for severe pain. 56 tablet 0  . pseudoephedrine (SUDAFED) 30 MG tablet Take 30 mg by mouth daily as needed for congestion.    Marland Kitchen spironolactone (ALDACTONE) 50  MG tablet TAKE 1 TABLET(50 MG) BY MOUTH DAILY 90 tablet 1  . triamcinolone cream (KENALOG) 0.1 % Apply 1 application topically daily as needed (eczema).     . gabapentin (NEURONTIN) 100 MG capsule Take 2 capsules (200 mg total) by mouth 3 (three) times daily. Take two 100 mg capsules three times a day for two weeks following surgery.Then take two 100 mg capsules two times a day for two weeks. Then take two 100 mg capsules once a day for two weeks.Then discontinue the Gabapentin. (Patient not taking: Reported on 08/14/2019) 168 capsule 0  . olmesartan (BENICAR) 40 MG tablet Take 1  tablet (40 mg total) by mouth daily. (Patient not taking: Reported on 08/14/2019) 90 tablet 1  . Pitavastatin Calcium (LIVALO) 2 MG TABS Take 0.5 tablets (1 mg total) by mouth daily. (Patient not taking: Reported on 08/14/2019) 45 tablet 1  . Polyethyl Glycol-Propyl Glycol (SYSTANE OP) Place 1 drop into both eyes daily as needed (dry eyes).    . saccharomyces boulardii (FLORASTOR) 250 MG capsule Take 250 mg by mouth 2 (two) times daily.    . traMADol (ULTRAM) 50 MG tablet Take 1-2 tablets (50-100 mg total) by mouth every 6 (six) hours as needed for moderate pain. (Patient not taking: Reported on 08/14/2019) 40 tablet 0   No current facility-administered medications for this visit.    Allergies  Allergen Reactions  . Hctz [Hydrochlorothiazide] Other (See Comments)    Causes extreme hyponatremia quickly - 126  . Novocain [Procaine]     "makes me tremble"   . Amlodipine Swelling    Mild but chronic lower extremity edema    Social History   Socioeconomic History  . Marital status: Married    Spouse name: [name]  . Number of children: 4  . Years of education: Not on file  . Highest education level: Not on file  Occupational History  . 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 Smoking status: Never Smoker  . Smokeless tobacco: Never Used  Substance and Sexual Activity  . Alcohol use: Yes    Comment: rarely; 1-2x/mo  . Drug use: No  . Sexual activity: Yes    Birth control/protection: Post-menopausal, Surgical  Other Topics Concern  . Not on file  Social History Narrative   Lives with her husband.   Adult children. Two live in [place].   Social Determinants of Health   Financial Resource Strain:   . Difficulty of Paying Living Expenses: Not on file  Food Insecurity:   . Worried About Charity fundraiser in the Last Year: Not on file  . Ran Out of Food in the Last Year: Not on file  Transportation Needs:   . Lack of Transportation  (Medical): Not on file  . Lack of Transportation (Non-Medical): Not on file  Physical Activity:   . Days of Exercise per Week: Not on file  . Minutes of Exercise per Session: Not on file  Stress:   . Feeling of Stress : Not on file  Social Connections:   . Frequency of Communication with Friends and Family: Not on file  . Frequency of Social Gatherings with Friends and Family: Not on file  . Attends Religious Services: Not on file  . Active Member of Clubs or Organizations: Not on file  . Attends Archivist Meetings: Not on file  . Marital Status: Not on file  Intimate Partner Violence:   . Fear of Current or  Ex-Partner: Not on file  . Emotionally Abused: Not on file  . Physically Abused: Not on file  . Sexually Abused: Not on file    ROS Review of Systems  Constitutional: Negative for activity change, appetite change, chills and fever.  HENT: Negative for congestion, nosebleeds, trouble swallowing and voice change.   Respiratory: Negative for cough, shortness of breath and wheezing.   Gastrointestinal: Negative for diarrhea, nausea and vomiting.   Neurological: Negative for dizziness, speech difficulty, light-headedness and numbness.  See HPI. All other review of systems negative.   Objective   Vitals as reported by the patient: There were no vitals filed for this visit.  BP Readings from Last 3 Encounters:  07/10/19 103/64  06/25/19 101/68  06/19/19 120/80     Kalen was seen today for sodium level/hypertension and medication refill.  Diagnoses and all orders for this visit:  Essential hypertension  Hypocalcemia  Hyponatremia   Advised pt to stop the spironolactone which is the only medication for hypertension she is taking. She had normal PTH She stopped olmesartan and her bp was still tightly controlled.  Drug holiday for one month Discussed that we will recheck the BMP in one month to see if the spironolactone is the cause    I discussed  the assessment and treatment plan with the patient. The patient was provided an opportunity to ask questions and all were answered. The patient agreed with the plan and demonstrated an understanding of the instructions.   The patient was advised to call back or seek an in-person evaluation if the symptoms worsen or if the condition fails to improve as anticipated.  I provided 20 minutes of non-face-to-face time during this encounter.  Forrest Moron, MD  Primary Care at [facility]

## 2019-08-15 LAB — CMP14+EGFR
ALT: 20 IU/L (ref 0–32)
AST: 24 IU/L (ref 0–40)
Albumin/Globulin Ratio: 2.3 — ABNORMAL HIGH (ref 1.2–2.2)
Albumin: 4.6 g/dL (ref 3.8–4.8)
Alkaline Phosphatase: 88 IU/L (ref 39–117)
BUN/Creatinine Ratio: 26 (ref 12–28)
BUN: 21 mg/dL (ref 8–27)
Bilirubin Total: <0.2 mg/dL (ref 0.0–1.2)
CO2: 25 mmol/L (ref 20–29)
Calcium: 9.7 mg/dL (ref 8.7–10.3)
Chloride: 99 mmol/L (ref 96–106)
Creatinine, Ser: 0.8 mg/dL (ref 0.57–1.00)
GFR calc Af Amer: 86 mL/min/{1.73_m2} (ref >59)
GFR calc non Af Amer: 75 mL/min/{1.73_m2} (ref >59)
Globulin, Total: 2 g/dL (ref 1.5–4.5)
Glucose: 110 mg/dL — ABNORMAL HIGH (ref 65–99)
Potassium: 4.4 mmol/L (ref 3.5–5.2)
Sodium: 135 mmol/L (ref 134–144)
Total Protein: 6.6 g/dL (ref 6.0–8.5)

## 2019-08-16 ENCOUNTER — Encounter: Payer: Self-pay | Admitting: *Deleted

## 2019-08-16 DIAGNOSIS — Z96651 Presence of right artificial knee joint: Secondary | ICD-10-CM | POA: Insufficient documentation

## 2019-08-19 DIAGNOSIS — M25561 Pain in right knee: Secondary | ICD-10-CM | POA: Diagnosis not present

## 2019-08-21 DIAGNOSIS — M25561 Pain in right knee: Secondary | ICD-10-CM | POA: Diagnosis not present

## 2019-09-09 ENCOUNTER — Ambulatory Visit: Payer: PPO

## 2019-09-09 DIAGNOSIS — L570 Actinic keratosis: Secondary | ICD-10-CM | POA: Diagnosis not present

## 2019-09-09 DIAGNOSIS — D2271 Melanocytic nevi of right lower limb, including hip: Secondary | ICD-10-CM | POA: Diagnosis not present

## 2019-09-09 DIAGNOSIS — Z85828 Personal history of other malignant neoplasm of skin: Secondary | ICD-10-CM | POA: Diagnosis not present

## 2019-09-09 DIAGNOSIS — Z23 Encounter for immunization: Secondary | ICD-10-CM | POA: Diagnosis not present

## 2019-09-09 DIAGNOSIS — L814 Other melanin hyperpigmentation: Secondary | ICD-10-CM | POA: Diagnosis not present

## 2019-09-09 DIAGNOSIS — L821 Other seborrheic keratosis: Secondary | ICD-10-CM | POA: Diagnosis not present

## 2019-09-09 DIAGNOSIS — L578 Other skin changes due to chronic exposure to nonionizing radiation: Secondary | ICD-10-CM | POA: Diagnosis not present

## 2019-09-09 DIAGNOSIS — L309 Dermatitis, unspecified: Secondary | ICD-10-CM | POA: Diagnosis not present

## 2019-09-09 DIAGNOSIS — D225 Melanocytic nevi of trunk: Secondary | ICD-10-CM | POA: Diagnosis not present

## 2019-11-06 ENCOUNTER — Ambulatory Visit (AMBULATORY_SURGERY_CENTER): Payer: Self-pay | Admitting: *Deleted

## 2019-11-06 ENCOUNTER — Other Ambulatory Visit: Payer: Self-pay

## 2019-11-06 ENCOUNTER — Encounter: Payer: Self-pay | Admitting: Family Medicine

## 2019-11-06 VITALS — Temp 97.8°F | Ht 62.0 in | Wt 131.0 lb

## 2019-11-06 DIAGNOSIS — E785 Hyperlipidemia, unspecified: Secondary | ICD-10-CM

## 2019-11-06 DIAGNOSIS — Z01818 Encounter for other preprocedural examination: Secondary | ICD-10-CM

## 2019-11-06 DIAGNOSIS — Z86010 Personal history of colonic polyps: Secondary | ICD-10-CM

## 2019-11-06 NOTE — Progress Notes (Signed)

## 2019-11-08 ENCOUNTER — Other Ambulatory Visit: Payer: Self-pay

## 2019-11-08 DIAGNOSIS — E785 Hyperlipidemia, unspecified: Secondary | ICD-10-CM

## 2019-11-08 MED ORDER — LIVALO 2 MG PO TABS: 1.0000 mg | ORAL_TABLET | Freq: Every day | ORAL | 0 refills | Status: DC

## 2019-11-12 ENCOUNTER — Other Ambulatory Visit: Payer: Self-pay | Admitting: Family Medicine

## 2019-11-12 ENCOUNTER — Telehealth: Payer: Self-pay | Admitting: Family Medicine

## 2019-11-12 DIAGNOSIS — E785 Hyperlipidemia, unspecified: Secondary | ICD-10-CM

## 2019-11-12 NOTE — Telephone Encounter (Signed)
Pt called regarding a courtesy refill for her   Pitavastatin Calcium (LIVALO) 2 MG TABS [id number]   Medication. Pt has an apt sch on 3/31 for this med refill. [phone]. Please advise.

## 2019-11-12 NOTE — Telephone Encounter (Signed)
Livalo 2 mg #45 with 0 refills sent on 11/08/2019 per Dr. Nolon Rod.  Pt needs ov for further refills.

## 2019-11-15 ENCOUNTER — Ambulatory Visit (INDEPENDENT_AMBULATORY_CARE_PROVIDER_SITE_OTHER): Payer: PPO

## 2019-11-15 ENCOUNTER — Other Ambulatory Visit: Payer: Self-pay | Admitting: Gastroenterology

## 2019-11-15 ENCOUNTER — Encounter: Payer: Self-pay | Admitting: Gastroenterology

## 2019-11-15 DIAGNOSIS — Z1159 Encounter for screening for other viral diseases: Secondary | ICD-10-CM

## 2019-11-15 LAB — SARS CORONAVIRUS 2 (TAT 6-24 HRS): SARS Coronavirus 2: NEGATIVE

## 2019-11-20 ENCOUNTER — Encounter: Payer: Self-pay | Admitting: Gastroenterology

## 2019-11-20 ENCOUNTER — Other Ambulatory Visit: Payer: Self-pay

## 2019-11-20 ENCOUNTER — Ambulatory Visit (AMBULATORY_SURGERY_CENTER): Payer: PPO | Admitting: Gastroenterology

## 2019-11-20 VITALS — BP 159/95 | HR 84 | Temp 96.6°F | Resp 12 | Ht 62.0 in | Wt 131.0 lb

## 2019-11-20 DIAGNOSIS — Z1211 Encounter for screening for malignant neoplasm of colon: Secondary | ICD-10-CM | POA: Diagnosis not present

## 2019-11-20 DIAGNOSIS — R197 Diarrhea, unspecified: Secondary | ICD-10-CM | POA: Diagnosis not present

## 2019-11-20 DIAGNOSIS — Z86010 Personal history of colonic polyps: Secondary | ICD-10-CM

## 2019-11-20 DIAGNOSIS — K599 Functional intestinal disorder, unspecified: Secondary | ICD-10-CM | POA: Diagnosis not present

## 2019-11-20 MED ORDER — SODIUM CHLORIDE 0.9 % IV SOLN: 500.0000 mL | Freq: Once | INTRAVENOUS | Status: DC

## 2019-11-20 NOTE — Op Note (Addendum)
[place] Patient Name: Amanda Curry Procedure Date: 11/20/2019 8:22 AM MRN: [id number] Endoscopist: Thornton Park MD, MD Age: 69 Referring MD:  Date of Birth: Jan 16, 1951 Gender: Female Account #: [id number] Procedure:                Colonoscopy Indications:              Surveillance: Personal history of adenomatous                            polyps on last colonoscopy 5 years ago                           Colonoscopy recommended given the history of colon                            polyps                           - polypoid mucosa on colonoscopy 2009                           - 10mm descending colon tubular adenoma on                            colonoscopy 2016                           - surveillance colonoscopy 2021                           Family history of colon cancer (maternal                            grandmother)                           Seen in 2019 for diarrhea, although symptoms                            improved she continues to use Imodium for symptom                            relief Medicines:                Monitored Anesthesia Care Procedure:                Pre-Anesthesia Assessment:                           - Prior to the procedure, a History and Physical                            was performed, and patient medications and                            allergies were reviewed. The patient's tolerance of  previous anesthesia was also reviewed. The risks                            and benefits of the procedure and the sedation                            options and risks were discussed with the patient.                            All questions were answered, and informed consent                            was obtained. Prior Anticoagulants: The patient has                            taken no previous anticoagulant or antiplatelet                            agents. ASA Grade Assessment: II - A patient with                             mild systemic disease. After reviewing the risks                            and benefits, the patient was deemed in                            satisfactory condition to undergo the procedure.                           After obtaining informed consent, the colonoscope                            was passed under direct vision. Throughout the                            procedure, the patient's blood pressure, pulse, and                            oxygen saturations were monitored continuously. The                            Colonoscope was introduced through the anus and                            advanced to the the cecum, identified by                            appendiceal orifice and ileocecal valve. The                            colonoscopy was performed with difficulty due to  multiple diverticula in the colon, a redundant                            colon, significant looping and a tortuous colon.                            Successful completion of the procedure was aided by                            changing the patient's position and applying                            abdominal pressure. A second forward view of the                            right colon was performed. The patient tolerated                            the procedure well. The quality of the bowel                            preparation was good. The ileocecal valve,                            appendiceal orifice, and rectum were photographed. Scope In: 8:27:51 AM Scope Out: 8:46:51 AM Scope Withdrawal Time: 0 hours 10 minutes 58 seconds  Total Procedure Duration: 0 hours 19 minutes 0 seconds  Findings:                 The perianal and digital rectal examinations were                            normal.                           Multiple small and large-mouthed diverticula were                            found in the entire colon. The diverticulosis is                            most  severe in the left colon.                           The colon (entire examined portion) appeared                            normal. Biopsies for histology were taken with a                            cold forceps from the right colon and left colon                            for evaluation of microscopic colitis. Estimated  blood loss was minimal.                           Non-bleeding internal hemorrhoids were found. The                            hemorrhoids were small.                           The exam was otherwise without abnormality on                            direct and retroflexion views. Complications:            No immediate complications. Estimated blood loss:                            Minimal. Estimated Blood Loss:     Estimated blood loss was minimal. Impression:               - Diverticulosis in the entire examined colon.                           - The entire examined colon is normal. Biopsied.                           - Non-bleeding internal hemorrhoids.                           - The examination was otherwise normal on direct                            and retroflexion views. Recommendation:           - Patient has a contact number available for                            emergencies. The signs and symptoms of potential                            delayed complications were discussed with the                            patient. Return to normal activities tomorrow.                            Written discharge instructions were provided to the                            patient.                           - Continue present medications.                           - Await pathology results.                           -  Follow a high fiber diet. Drink at least 64                            ounces of water daily. Add a daily stool bulking                            agent such as psyllium (an exampled would be                             Metamucil).                           - Emerging evidence supports eating a diet of                            fruits, vegetables, grains, calcium, and yogurt                            while reducing red meat and alcohol may reduce the                            risk of colon cancer.                           - Thank you for allowing me to be involved in your                            colon cancer prevention. Thornton Park MD, MD 11/20/2019 8:53:08 AM This report has been signed electronically.

## 2019-11-20 NOTE — Progress Notes (Signed)
Pt's states no medical or surgical changes since previsit or office visit.  JB IV, LC temp and CW vitals.

## 2019-11-20 NOTE — Progress Notes (Signed)
A and O x3. Report to RN. Tolerated MAC anesthesia well.

## 2019-11-20 NOTE — Progress Notes (Signed)
Called to room to assist during endoscopic procedure.  Patient ID and intended procedure confirmed with present staff. Received instructions for my participation in the procedure from the performing physician.  

## 2019-11-20 NOTE — Patient Instructions (Signed)
Discharge instructions given. Handouts on Diverticulosis and hemorrhoids. Resume prervious medications. YOU HAD AN ENDOSCOPIC PROCEDURE TODAY AT [facility]:   Refer to the procedure report that was given to you for any specific questions about what was found during the examination.  If the procedure report does not answer your questions, please call your gastroenterologist to clarify.  If you requested that your care partner not be given the details of your procedure findings, then the procedure report has been included in a sealed envelope for you to review at your convenience later.  YOU SHOULD EXPECT: Some feelings of bloating in the abdomen. Passage of more gas than usual.  Walking can help get rid of the air that was put into your GI tract during the procedure and reduce the bloating. If you had a lower endoscopy (such as a colonoscopy or flexible sigmoidoscopy) you may notice spotting of blood in your stool or on the toilet paper. If you underwent a bowel prep for your procedure, you may not have a normal bowel movement for a few days.  Please Note:  You might notice some irritation and congestion in your nose or some drainage.  This is from the oxygen used during your procedure.  There is no need for concern and it should clear up in a day or so.  SYMPTOMS TO REPORT IMMEDIATELY:   Following lower endoscopy (colonoscopy or flexible sigmoidoscopy):  Excessive amounts of blood in the stool  Significant tenderness or worsening of abdominal pains  Swelling of the abdomen that is new, acute  Fever of 100F or higher   For urgent or emergent issues, a gastroenterologist can be reached at any hour by calling [phone]. Do not use MyChart messaging for urgent concerns.    DIET:  We do recommend a small meal at first, but then you may proceed to your regular diet.  Drink plenty of fluids but you should avoid alcoholic beverages for 24 hours.  ACTIVITY:  You should plan to  take it easy for the rest of today and you should NOT DRIVE or use heavy machinery until tomorrow (because of the sedation medicines used during the test).    FOLLOW UP: Our staff will call the number listed on your records 48-72 hours following your procedure to check on you and address any questions or concerns that you may have regarding the information given to you following your procedure. If we do not reach you, we will leave a message.  We will attempt to reach you two times.  During this call, we will ask if you have developed any symptoms of COVID 19. If you develop any symptoms (ie: fever, flu-like symptoms, shortness of breath, cough etc.) before then, please call [phone].  If you test positive for Covid 19 in the 2 weeks post procedure, please call and report this information to [place].    If any biopsies were taken you will be contacted by phone or by letter within the next 1-3 weeks.  Please call us at [phone] if you have not heard about the biopsies in 3 weeks.    SIGNATURES/CONFIDENTIALITY: You and/or your care partner have signed paperwork which will be entered into your electronic medical record.  These signatures attest to the fact that that the information above on your After Visit Summary has been reviewed and is understood.  Full responsibility of the confidentiality of this discharge information lies with you and/or your care-partner.

## 2019-11-22 ENCOUNTER — Telehealth: Payer: Self-pay

## 2019-11-22 NOTE — Telephone Encounter (Signed)
No answer, left message to call back later today, B.Alexx Mcburney RN. 

## 2019-11-22 NOTE — Telephone Encounter (Signed)
  Follow up Call-  Call back number 11/20/2019  Post procedure Call Back phone  # [phone]  Permission to leave phone message Yes  Some recent data might be hidden     Patient questions:  Do you have a fever, pain , or abdominal swelling? No. Pain Score  0 *  Have you tolerated food without any problems? Yes.    Have you been able to return to your normal activities? Yes.    Do you have any questions about your discharge instructions: Diet   No. Medications  No. Follow up visit  No.  Do you have questions or concerns about your Care? No.  Actions: * If pain score is 4 or above: 1. No action needed, pain <4.Have you developed a fever since your procedure? no  2.   Have you had an respiratory symptoms (SOB or cough) since your procedure? no  3.   Have you tested positive for COVID 19 since your procedure no  4.   Have you had any family members/close contacts diagnosed with the COVID 19 since your procedure?  no   If yes to any of these questions please route to Joylene John, RN and Erenest Rasher, RN

## 2019-11-27 ENCOUNTER — Other Ambulatory Visit: Payer: Self-pay

## 2019-11-27 ENCOUNTER — Encounter: Payer: Self-pay | Admitting: Family Medicine

## 2019-11-27 ENCOUNTER — Ambulatory Visit (INDEPENDENT_AMBULATORY_CARE_PROVIDER_SITE_OTHER): Payer: PPO | Admitting: Family Medicine

## 2019-11-27 ENCOUNTER — Encounter: Payer: Self-pay | Admitting: Gastroenterology

## 2019-11-27 VITALS — BP 114/72 | HR 85 | Temp 97.8°F | Resp 17 | Ht 62.0 in | Wt 130.8 lb

## 2019-11-27 DIAGNOSIS — I10 Essential (primary) hypertension: Secondary | ICD-10-CM | POA: Diagnosis not present

## 2019-11-27 DIAGNOSIS — E785 Hyperlipidemia, unspecified: Secondary | ICD-10-CM

## 2019-11-27 DIAGNOSIS — R7303 Prediabetes: Secondary | ICD-10-CM | POA: Diagnosis not present

## 2019-11-27 LAB — POCT GLYCOSYLATED HEMOGLOBIN (HGB A1C): Hemoglobin A1C: 6.1 % — AB (ref 4.0–5.6)

## 2019-11-27 NOTE — Patient Instructions (Signed)
° ° ° °  If you have lab work done today you will be contacted with your lab results within the next 2 weeks.  If you have not heard from us then please contact us. The fastest way to get your results is to register for My Chart. ° ° °IF you received an x-ray today, you will receive an invoice from [facility]. Please contact [facility] at [phone] with questions or concerns regarding your invoice.  ° °IF you received labwork today, you will receive an invoice from [facility]. Please contact [facility] at [phone] with questions or concerns regarding your invoice.  ° °Our billing staff will not be able to assist you with questions regarding bills from these companies. ° °You will be contacted with the lab results as soon as they are available. The fastest way to get your results is to activate your My Chart account. Instructions are located on the last page of this paperwork. If you have not heard from us regarding the results in 2 weeks, please contact this office. °  ° ° ° °

## 2019-11-27 NOTE — Progress Notes (Signed)
Established Patient Office Visit  Subjective:  Patient ID: Amanda Curry, female    DOB: Nov 21, 1950  Age: 69 y.o. MRN: [id number]  CC:  Chief Complaint  Patient presents with  . Medication Refill    pitavastatin    HPI Amanda Curry presents for  Hypertension Patient reports that she is taking her bp medication spironolactone 50mg  She denies muscle cramps She reports that she is sticking to a DASH diet She exercise on the bike for 30 minutes and the treadmill for 30 minutes   She takes pitavastatin 1mg  for her cholesterol She had knee replacement and is feeling better and has more energy She denies any issues from the pitavastatin Wt Readings from Last 3 Encounters:  11/27/19 130 lb 12.8 oz (59.3 kg)  11/20/19 131 lb (59.4 kg)  11/06/19 131 lb (59.4 kg)     Past Medical History:  Diagnosis Date  . Adenomatous polyps   . Arthritis   . Diverticulosis   . Ganglion of left wrist    thumb joint  . Hyperlipidemia   . Hypertension   . Pre-diabetes   . Tinnitus     Past Surgical History:  Procedure Laterality Date  . COLONOSCOPY     x 3  . POLYPECTOMY    . TONSILLECTOMY    . TOTAL KNEE ARTHROPLASTY Right 06/24/2019   Procedure: TOTAL KNEE ARTHROPLASTY;  Surgeon: Gaynelle Arabian, MD;  Location: [facility];  Service: Orthopedics;  Laterality: Right;  26min  . TUBAL LIGATION      Family History  Problem Relation Age of Onset  . Colon cancer Maternal Grandmother   . Cancer Maternal Grandmother        colon  . Prostate cancer Father   . Cancer Father        prostate  . Alzheimer's disease Mother   . Hyperlipidemia Mother   . Hypertension Mother   . Heart disease Paternal Grandmother   . Heart disease Paternal Grandfather   . Hypertension Sister   . Colon polyps Neg Hx   . Esophageal cancer Neg Hx   . Rectal cancer Neg Hx   . Stomach cancer Neg Hx     Social History   Socioeconomic History  . Marital status: Married    Spouse name: [name]  . Number of  children: 4  . Years of education: Not on file  . Highest education level: Not on file  Occupational History  . 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 Smoking status: Never Smoker  . Smokeless tobacco: Never Used  Substance and Sexual Activity  . Alcohol use: Yes    Comment: rarely; 1-2x/mo  . Drug use: No  . Sexual activity: Yes    Birth control/protection: Post-menopausal, Surgical  Other Topics Concern  . Not on file  Social History Narrative   Lives with her husband.   Adult children. Two live in [place].   Social Determinants of Health   Financial Resource Strain:   . Difficulty of Paying Living Expenses:   Food Insecurity:   . Worried About Charity fundraiser in the Last Year:   . Arboriculturist in the Last Year:   Transportation Needs:   . Film/video editor (Medical):   Marland Kitchen Lack of Transportation (Non-Medical):   Physical Activity:   . Days of Exercise per Week:   . Minutes of Exercise per Session:   Stress:   . Feeling of Stress :  Social Connections:   . Frequency of Communication with Friends and Family:   . Frequency of Social Gatherings with Friends and Family:   . Attends Religious Services:   . Active Member of Clubs or Organizations:   . Attends Archivist Meetings:   Marland Kitchen Marital Status:   Intimate Partner Violence:   . Fear of Current or Ex-Partner:   . Emotionally Abused:   Marland Kitchen Physically Abused:   . Sexually Abused:     Outpatient Medications Prior to Visit  Medication Sig Dispense Refill  . acetaminophen (TYLENOL) 500 MG tablet Take 1,000 mg by mouth every 6 (six) hours as needed for moderate pain or headache.    . estradiol (ESTRACE) 0.1 MG/GM vaginal cream Place 1 Applicatorful vaginally once a week.   0  . famotidine (PEPCID) 20 MG tablet Take 20 mg by mouth daily as needed for heartburn or indigestion.    . hydrocortisone cream 1 % hydrocortisone 1 % topical cream  APPLY TO AFFECTED  AREA TWICE A DAY    . ibuprofen (ADVIL) 200 MG tablet Take 200 mg by mouth every 6 (six) hours as needed.    . loperamide (IMODIUM A-D) 2 MG tablet Take 1-2 mg by mouth 4 (four) times daily as needed for diarrhea or loose stools.     . Melatonin 3 MG TABS Take 3 mg by mouth at bedtime as needed (sleep).     . Multiple Vitamin (MULTI VITAMIN DAILY PO) Take by mouth.    . Pitavastatin Calcium (LIVALO) 2 MG TABS Take 0.5 tablets (1 mg total) by mouth daily. 45 tablet 0  . Polyethyl Glycol-Propyl Glycol (SYSTANE OP) Place 1 drop into both eyes daily as needed (dry eyes).    . Probiotic Product (PROBIOTIC PO) Take by mouth.    . pseudoephedrine (SUDAFED) 30 MG tablet Take 30 mg by mouth daily as needed for congestion.    . saccharomyces boulardii (FLORASTOR) 250 MG capsule Take 250 mg by mouth 2 (two) times daily.    Marland Kitchen spironolactone (ALDACTONE) 50 MG tablet TAKE 1 TABLET(50 MG) BY MOUTH DAILY 90 tablet 1  . triamcinolone cream (KENALOG) 0.1 % Apply 1 application topically daily as needed (eczema). 30 g 1  . gabapentin (NEURONTIN) 100 MG capsule Take 2 capsules (200 mg total) by mouth 3 (three) times daily. Take two 100 mg capsules three times a day for two weeks following surgery.Then take two 100 mg capsules two times a day for two weeks. Then take two 100 mg capsules once a day for two weeks.Then discontinue the Gabapentin. (Patient not taking: Reported on 08/14/2019) 168 capsule 0  . methocarbamol (ROBAXIN) 500 MG tablet Take 1 tablet (500 mg total) by mouth every 6 (six) hours as needed for muscle spasms. (Patient not taking: Reported on 11/20/2019) 40 tablet 0  . olmesartan (BENICAR) 40 MG tablet Take 1 tablet (40 mg total) by mouth daily. (Patient not taking: Reported on 08/14/2019) 90 tablet 1  . oxyCODONE (OXY IR/ROXICODONE) 5 MG immediate release tablet Take 1-2 tablets (5-10 mg total) by mouth every 6 (six) hours as needed for severe pain. (Patient not taking: Reported on 11/27/2019) 56 tablet 0    . traMADol (ULTRAM) 50 MG tablet Take 1-2 tablets (50-100 mg total) by mouth every 6 (six) hours as needed for moderate pain. (Patient not taking: Reported on 11/27/2019) 40 tablet 0   No facility-administered medications prior to visit.    Allergies  Allergen Reactions  . Hctz [Hydrochlorothiazide] Other (  See Comments)    Causes extreme hyponatremia quickly - 126  . Epinephrine Other (See Comments)    Brief trembling sensation   . Novocain [Procaine]     "makes me tremble"   . Amlodipine Swelling    Mild but chronic lower extremity edema    ROS Review of Systems Review of Systems  Constitutional: Negative for activity change, appetite change, chills and fever.  HENT: Negative for congestion, nosebleeds, trouble swallowing and voice change.   Respiratory: Negative for cough, shortness of breath and wheezing.   Gastrointestinal: Negative for diarrhea, nausea and vomiting.  Genitourinary: Negative for difficulty urinating, dysuria, flank pain and hematuria.  Musculoskeletal: Negative for back pain, joint swelling and neck pain.  Neurological: Negative for dizziness, speech difficulty, light-headedness and numbness.  See HPI. All other review of systems negative.     Objective:    Physical Exam  BP 114/72   Pulse 85   Temp 97.8 F (36.6 C) (Temporal)   Resp 17   Ht 5\' 2"  (1.575 m)   Wt 130 lb 12.8 oz (59.3 kg)   SpO2 95%   BMI 23.92 kg/m  Wt Readings from Last 3 Encounters:  11/27/19 130 lb 12.8 oz (59.3 kg)  11/20/19 131 lb (59.4 kg)  11/06/19 131 lb (59.4 kg)   Physical Exam  Constitutional: Oriented to person, place, and time. Appears well-developed and well-nourished.  HENT:  Head: Normocephalic and atraumatic.  Eyes: Conjunctivae and EOM are normal.  Cardiovascular: Normal rate, regular rhythm, normal heart sounds and intact distal pulses.  No murmur heard. Pulmonary/Chest: Effort normal and breath sounds normal. No stridor. No respiratory distress. Has no  wheezes.  Neurological: Is alert and oriented to person, place, and time.  Skin: Skin is warm. Capillary refill takes less than 2 seconds.  Psychiatric: Has a normal mood and affect. Behavior is normal. Judgment and thought content normal.   Right Knee with postop scar, improved range of motion   Health Maintenance Due  Topic Date Due  . TETANUS/TDAP  Never done  . MAMMOGRAM  08/03/2018    There are no preventive care reminders to display for this patient.  Lab Results  Component Value Date   TSH 3.730 09/11/2018   Lab Results  Component Value Date   WBC 8.5 07/10/2019   HGB 10.2 (L) 07/10/2019   HCT 30.7 (L) 07/10/2019   MCV 91 07/10/2019   PLT 643 (H) 07/10/2019   Lab Results  Component Value Date   NA 135 08/14/2019   K 4.4 08/14/2019   CO2 25 08/14/2019   GLUCOSE 110 (H) 08/14/2019   BUN 21 08/14/2019   CREATININE 0.80 08/14/2019   BILITOT <0.2 08/14/2019   ALKPHOS 88 08/14/2019   AST 24 08/14/2019   ALT 20 08/14/2019   PROT 6.6 08/14/2019   ALBUMIN 4.6 08/14/2019   CALCIUM 9.7 08/14/2019   ANIONGAP 8 06/25/2019   Lab Results  Component Value Date   CHOL 196 07/23/2018   Lab Results  Component Value Date   HDL 61 07/23/2018   Lab Results  Component Value Date   LDLCALC 105 (H) 07/23/2018   Lab Results  Component Value Date   TRIG 149 07/23/2018   Lab Results  Component Value Date   CHOLHDL 3.2 07/23/2018   Lab Results  Component Value Date   HGBA1C 6.1 (H) 06/19/2019      Assessment & Plan:   Problem List Items Addressed This Visit      Cardiovascular and  Mediastinum   HTN (hypertension) - Primary - Patient's blood pressure is at goal of 139/89 or less. Condition is stable. Continue current medications and treatment plan. I recommend that you exercise for 30-45 minutes 5 days a week. I also recommend a balanced diet with fruits and vegetables every day, lean meats, and little fried foods. The DASH diet (you can find this online) is a  good example of this.    Relevant Orders   Comprehensive metabolic panel     Other   Hyperlipidemia - continue 1mg  pitavastatin   Prediabetes   Relevant Orders   POCT glycosylated hemoglobin (Hb A1C)      No orders of the defined types were placed in this encounter.   Follow-up: No follow-ups on file.    Forrest Moron, MD

## 2019-11-28 LAB — COMPREHENSIVE METABOLIC PANEL
ALT: 17 IU/L (ref 0–32)
AST: 23 IU/L (ref 0–40)
Albumin/Globulin Ratio: 2 (ref 1.2–2.2)
Albumin: 4.6 g/dL (ref 3.7–4.7)
Alkaline Phosphatase: 81 IU/L (ref 39–117)
BUN/Creatinine Ratio: 24 (ref 12–28)
BUN: 21 mg/dL (ref 8–27)
Bilirubin Total: 0.3 mg/dL (ref 0.0–1.2)
CO2: 22 mmol/L (ref 20–29)
Calcium: 9.4 mg/dL (ref 8.7–10.3)
Chloride: 98 mmol/L (ref 96–106)
Creatinine, Ser: 0.86 mg/dL (ref 0.57–1.00)
GFR calc Af Amer: 79 mL/min/{1.73_m2} (ref >59)
GFR calc non Af Amer: 68 mL/min/{1.73_m2} (ref >59)
Globulin, Total: 2.3 g/dL (ref 1.5–4.5)
Glucose: 81 mg/dL (ref 65–99)
Potassium: 4.9 mmol/L (ref 3.5–5.2)
Sodium: 135 mmol/L (ref 134–144)
Total Protein: 6.9 g/dL (ref 6.0–8.5)

## 2019-12-03 ENCOUNTER — Encounter: Payer: Self-pay | Admitting: Family Medicine

## 2019-12-04 NOTE — Telephone Encounter (Signed)
Patient would like to know if we have been notified from her Pharmacy that she has received her Covid-19 Vaccines through them. We have not been notified but will enter information when we get the paper from [facility]. She also had some kind words for you.  Please Advise.

## 2019-12-05 DIAGNOSIS — N819 Female genital prolapse, unspecified: Secondary | ICD-10-CM | POA: Diagnosis not present

## 2019-12-24 ENCOUNTER — Other Ambulatory Visit: Payer: Self-pay | Admitting: Family Medicine

## 2019-12-24 DIAGNOSIS — I10 Essential (primary) hypertension: Secondary | ICD-10-CM

## 2020-01-08 DIAGNOSIS — N8182 Incompetence or weakening of pubocervical tissue: Secondary | ICD-10-CM | POA: Diagnosis not present

## 2020-01-08 DIAGNOSIS — I10 Essential (primary) hypertension: Secondary | ICD-10-CM | POA: Diagnosis not present

## 2020-01-08 DIAGNOSIS — N819 Female genital prolapse, unspecified: Secondary | ICD-10-CM | POA: Diagnosis not present

## 2020-01-09 NOTE — Progress Notes (Signed)
PCP -Grant Fontana, MD Cardiologist -   Chest x-ray -  EKG - 06-11-19 Stress Test -  ECHO -  Cardiac Cath -   Sleep Study -  CPAP -    HGA1C 6.1 (Epic 11-27-19) Fasting Blood Sugar -  Checks Blood Sugar _____ times a day  Blood Thinner Instructions: Aspirin Instructions: Last Dose:  Anesthesia review:   Patient denies shortness of breath, fever, cough and chest pain at PAT appointment   Patient verbalized understanding of instructions that were given to them at the PAT appointment. Patient was also instructed that they will need to review over the PAT instructions again at home before surgery.

## 2020-01-09 NOTE — Patient Instructions (Addendum)
YOU ARE SCHEDULED FOR A COVID TEST 5-20-21_@____________ . THIS TEST MUST BE DONE BEFORE SURGERY. GO TO  [address], [place], [address] AND REMAIN IN YOUR CAR, THIS IS A DRIVE UP TEST. ONCE YOUR COVID TEST IS DONE PLEASE FOLLOW ALL THE QUARANTINE  INSTRUCTIONS GIVEN IN YOUR HANDOUT.      Your procedure is scheduled on 01-20-20   Report to [facility] AT  5:30 A. M.   Call this number if you have problems the morning of surgery  :[phone].   OUR ADDRESS IS [place].  WE ARE LOCATED IN [facility].                                     REMEMBER:  DO NOT EAT FOOD OR DRINK LIQUIDS AFTER MIDNIGHT .    YOU MAY  BRUSH YOUR TEETH MORNING OF SURGERY AND RINSE YOUR MOUTH OUT, NO CHEWING GUM CANDY OR MINTS.   TAKE THESE MEDICATIONS MORNING OF SURGERY WITH A SIP OF WATER:  None  IF YOU ARE SPENDING THE NIGHT AFTER SURGERY PLEASE BRING ALL YOUR PRESCRIPTION MEDICATIONS IN THEIR ORIGINAL BOTTLES.   1 VISITOR IS ALLOWED IN WAITING ROOM ONLY DAY OF SURGERY.   NO VISITOR MAY SPEND THE NIGHT. VISITOR ARE ALLOWED TO STAY UNTIL 800 PM.                                    DO NOT WEAR JEWERLY, MAKE UP, OR NAIL POLISH ON FINGERNAILS. DO NOT WEAR LOTIONS, POWDERS, PERFUMES OR DEODORANT. DO NOT SHAVE FOR 24 HOURS PRIOR TO DAY OF SURGERY. MEN MAY SHAVE FACE AND NECK. CONTACTS, GLASSES, OR DENTURES MAY NOT BE WORN TO SURGERY.                                    [facility] IS NOT RESPONSIBLE  FOR ANY BELONGINGS.                                                                    Marland Kitchen                                         [facility] - Preparing for Surgery Before surgery, you can play an important role.   Because skin is not sterile, your skin needs to be as free of germs as possible.   You can reduce the number of germs on your skin by washing with CHG (chlorahexidine gluconate) soap before surgery.   CHG is an antiseptic cleaner which kills germs and  bonds with the skin to continue killing germs even after washing. Please DO NOT use if you have an allergy to CHG or antibacterial soaps.   If your skin becomes reddened/irritated stop using the CHG and inform your nurse when you arrive at Short Stay. Do not shave (including legs and underarms) for at least 48  hours prior to the first CHG shower.   Please follow these instructions carefully:  1.  Shower with CHG Soap the night before surgery and the  morning of Surgery.  2.  If you choose to wash your hair, wash your hair first as usual with your  normal  shampoo.  3.  After you shampoo, rinse your hair and body thoroughly to remove the  shampoo.                                        4.  Use CHG as you would any other liquid soap.  You can apply chg directly  to the skin and wash                       Gently with a scrungie or clean washcloth.  5.  Apply the CHG Soap to your body ONLY FROM THE NECK DOWN.   Do not use on face/ open                           Wound or open sores. Avoid contact with eyes, ears mouth and genitals (private parts).                       Wash face,  Genitals (private parts) with your normal soap.             6.  Wash thoroughly, paying special attention to the area where your surgery  will be performed.  7.  Thoroughly rinse your body with warm water from the neck down.  8.  DO NOT shower/wash with your normal soap after using and rinsing off  the CHG Soap.             9.  Pat yourself dry with a clean towel.            10.  Wear clean pajamas.            11.  Place clean sheets on your bed the night of your first shower and do not  sleep with pets. Day of Surgery : Do not apply any lotions/deodorants the morning of surgery.  Please wear clean clothes to the hospital/surgery center.  FAILURE TO FOLLOW THESE INSTRUCTIONS MAY RESULT IN THE CANCELLATION OF YOUR SURGERY PATIENT SIGNATURE_________________________________  NURSE  SIGNATURE__________________________________  ________________________________________________________________________

## 2020-01-09 NOTE — Progress Notes (Signed)
Please place surgery orders. Pt scheduled for her telephonic interview on 01-10-20

## 2020-01-10 ENCOUNTER — Other Ambulatory Visit: Payer: Self-pay

## 2020-01-10 ENCOUNTER — Encounter (HOSPITAL_COMMUNITY)
Admission: RE | Admit: 2020-01-10 | Discharge: 2020-01-10 | Disposition: A | Discharge status: Home / Self Care | Payer: PPO | Source: Ambulatory Visit | Attending: Obstetrics and Gynecology | Admitting: Obstetrics and Gynecology

## 2020-01-10 ENCOUNTER — Encounter (HOSPITAL_COMMUNITY): Payer: Self-pay

## 2020-01-10 DIAGNOSIS — I10 Essential (primary) hypertension: Secondary | ICD-10-CM | POA: Insufficient documentation

## 2020-01-10 DIAGNOSIS — Z01818 Encounter for other preprocedural examination: Secondary | ICD-10-CM | POA: Insufficient documentation

## 2020-01-10 HISTORY — DX: Gastro-esophageal reflux disease without esophagitis: K21.9

## 2020-01-10 LAB — BASIC METABOLIC PANEL
Anion gap: 6 (ref 5–15)
BUN: 22 mg/dL (ref 8–23)
CO2: 26 mmol/L (ref 22–32)
Calcium: 8.9 mg/dL (ref 8.9–10.3)
Chloride: 98 mmol/L (ref 98–111)
Creatinine, Ser: 0.93 mg/dL (ref 0.44–1.00)
GFR calc Af Amer: >60 mL/min (ref >60)
GFR calc non Af Amer: >60 mL/min (ref >60)
Glucose, Bld: 103 mg/dL — ABNORMAL HIGH (ref 70–99)
Potassium: 4.3 mmol/L (ref 3.5–5.1)
Sodium: 130 mmol/L — ABNORMAL LOW (ref 135–145)

## 2020-01-10 LAB — CBC
HCT: 38.3 % (ref 36.0–46.0)
Hemoglobin: 12.3 g/dL (ref 12.0–15.0)
MCH: 30.1 pg (ref 26.0–34.0)
MCHC: 32.1 g/dL (ref 30.0–36.0)
MCV: 93.9 fL (ref 80.0–100.0)
Platelets: 322 10*3/uL (ref 150–400)
RBC: 4.08 MIL/uL (ref 3.87–5.11)
RDW: 13.8 % (ref 11.5–15.5)
WBC: 8 10*3/uL (ref 4.0–10.5)
nRBC: 0 % (ref 0.0–0.2)

## 2020-01-10 NOTE — Progress Notes (Signed)
PCP -  Cardiologist -   Chest x-ray -  EKG -  Stress Test -  ECHO - had one 25 years ago. Pt was having panic attacks Cardiac Cath -   Sleep Study -  CPAP -   Fasting Blood Sugar - Pre diabetic. Under good control Checks Blood Sugar _____ times a day  Blood Thinner Instructions:NA Aspirin Instructions: Last Dose:  Anesthesia review:   Patient denies shortness of breath, fever, cough and chest pain at PAT appointment yes  Patient verbalized understanding of instructions that were given to them at the PAT appointment. Patient was also instructed that they will need to review over the PAT instructions again at home before surgery. yes

## 2020-01-16 ENCOUNTER — Other Ambulatory Visit (HOSPITAL_COMMUNITY)
Admission: RE | Admit: 2020-01-16 | Discharge: 2020-01-16 | Disposition: A | Discharge status: Home / Self Care | Payer: PPO | Source: Ambulatory Visit | Attending: Obstetrics and Gynecology | Admitting: Obstetrics and Gynecology

## 2020-01-16 DIAGNOSIS — Z20822 Contact with and (suspected) exposure to covid-19: Secondary | ICD-10-CM | POA: Diagnosis not present

## 2020-01-16 DIAGNOSIS — Z01812 Encounter for preprocedural laboratory examination: Secondary | ICD-10-CM | POA: Diagnosis not present

## 2020-01-16 LAB — SARS CORONAVIRUS 2 (TAT 6-24 HRS): SARS Coronavirus 2: NEGATIVE

## 2020-01-16 NOTE — H&P (Signed)
Patient name Amanda Curry, rote DICTATION# [id number] CSN# [id number]  [name], MD 01/16/2020 7:28 AM

## 2020-01-17 NOTE — H&P (Signed)
NAME: Amanda Curry, LORENZ MEDICAL RECORD [id number] ACCOUNT [id number] DATE OF BIRTH:04-16-1951 FACILITY: [facility] LOCATION: [facility] PHYSICIAN:Emin Foree Sherran Needs, MD  HISTORY AND PHYSICAL  DATE OF ADMISSION:  01/20/2020  HISTORY OF PRESENT ILLNESS:  The patient is a 69 year old gravida 54, para 4, abortus 2 female that presents for laparoscopic-assisted vaginal hysterectomy with bilateral salpingo-oophorectomy, anterior and posterior repair, along with a sacrospinous  ligament suspension.  In relation to the present admission, the patient has been managed long-term with pelvic relaxation in the form of uterine descensus with associated cystocele and rectocele.  She has been using a pessary for this.  She has now decided to proceed with  surgical management.  ALLERGIES:  SHE IS ALLERGIC TO HYDROCHLOROTHIAZIDE.  MEDICATIONS:  She is on Livalo 2 mg tablets and spironolactone.  PAST MEDICAL HISTORY:  She has had usual childhood diseases without any significant sequelae.  PAST SURGICAL HISTORY:  She has had a tonsillectomy, bilateral tubal ligation and total knee replacement.  SOCIAL HISTORY:  Reveals no tobacco or alcohol use.  FAMILY HISTORY:  Noncontributory.  PHYSICAL EXAMINATION: VITAL SIGNS:  The patient is afebrile with stable vital signs. HEENT:  The patient is normocephalic.  Pupils equal, round, react to light and accommodation.  Extraocular movements were intact.  Sclerae and conjunctivae clear.  Oropharynx clear. NECK:  Without thyromegaly. PULMONARY:  Lungs are clear to auscultation and percussion. CARDIOVASCULAR:  Regular rhythm and rate.  No murmurs or gallops. ABDOMEN:  No masses, organomegaly or tenderness. PELVIC:  Normal external genitalia.  Vaginal mucosa somewhat atrophic.  She has severe uterine descensus with associated cystocele and rectocele.  Cervix unremarkable.  Uterus normal size, shape and contour.  Adnexa free of masses or tenderness. EXTREMITIES:  Trace  edema. NEUROLOGIC:  Grossly within normal limits.  IMPRESSION:  Pelvic relaxation with uterine descensus, associated cystocele and rectocele with planned surgery.  PLAN:  The patient to undergo above-noted surgery.  The risks have been discussed, including the risk of infection.  Risk of hemorrhage that could require transfusion with the risk of AIDS or hepatitis.  Risk of injury to adjacent organs, including  bladder, bowel or ureters that could require further exploratory surgery.  Risk of deep venous thrombosis and pulmonary embolus.  The potential issue with recurrent pelvic relaxation requiring further surgical management has also been discussed.  The  patient expressed understanding of indication, risks and alternatives.  VN/NUANCE  D:01/16/2020 T:01/16/2020 JOB:[id number]

## 2020-01-20 ENCOUNTER — Other Ambulatory Visit: Payer: Self-pay

## 2020-01-20 ENCOUNTER — Observation Stay (HOSPITAL_BASED_OUTPATIENT_CLINIC_OR_DEPARTMENT_OTHER): Payer: PPO | Admitting: Certified Registered"

## 2020-01-20 ENCOUNTER — Encounter (HOSPITAL_BASED_OUTPATIENT_CLINIC_OR_DEPARTMENT_OTHER)
Admission: RE | Disposition: A | Discharge status: Home / Self Care | Payer: Self-pay | Source: Home / Self Care | Attending: Obstetrics and Gynecology

## 2020-01-20 ENCOUNTER — Observation Stay (HOSPITAL_BASED_OUTPATIENT_CLINIC_OR_DEPARTMENT_OTHER)
Admission: RE | Admit: 2020-01-20 | Discharge: 2020-01-21 | Disposition: A | Discharge status: Home / Self Care | Payer: PPO | Attending: Obstetrics and Gynecology | Admitting: Obstetrics and Gynecology

## 2020-01-20 ENCOUNTER — Encounter (HOSPITAL_BASED_OUTPATIENT_CLINIC_OR_DEPARTMENT_OTHER): Payer: Self-pay | Admitting: Obstetrics and Gynecology

## 2020-01-20 DIAGNOSIS — I10 Essential (primary) hypertension: Secondary | ICD-10-CM | POA: Insufficient documentation

## 2020-01-20 DIAGNOSIS — N72 Inflammatory disease of cervix uteri: Secondary | ICD-10-CM | POA: Diagnosis not present

## 2020-01-20 DIAGNOSIS — N816 Rectocele: Secondary | ICD-10-CM | POA: Insufficient documentation

## 2020-01-20 DIAGNOSIS — K219 Gastro-esophageal reflux disease without esophagitis: Secondary | ICD-10-CM | POA: Insufficient documentation

## 2020-01-20 DIAGNOSIS — Z888 Allergy status to other drugs, medicaments and biological substances status: Secondary | ICD-10-CM | POA: Diagnosis not present

## 2020-01-20 DIAGNOSIS — N802 Endometriosis of fallopian tube: Secondary | ICD-10-CM | POA: Diagnosis not present

## 2020-01-20 DIAGNOSIS — M1990 Unspecified osteoarthritis, unspecified site: Secondary | ICD-10-CM | POA: Insufficient documentation

## 2020-01-20 DIAGNOSIS — Z79899 Other long term (current) drug therapy: Secondary | ICD-10-CM | POA: Insufficient documentation

## 2020-01-20 DIAGNOSIS — Z90710 Acquired absence of both cervix and uterus: Secondary | ICD-10-CM | POA: Diagnosis present

## 2020-01-20 DIAGNOSIS — N800 Endometriosis of uterus: Secondary | ICD-10-CM | POA: Insufficient documentation

## 2020-01-20 DIAGNOSIS — D271 Benign neoplasm of left ovary: Secondary | ICD-10-CM | POA: Diagnosis not present

## 2020-01-20 DIAGNOSIS — Z96659 Presence of unspecified artificial knee joint: Secondary | ICD-10-CM | POA: Diagnosis not present

## 2020-01-20 DIAGNOSIS — D270 Benign neoplasm of right ovary: Secondary | ICD-10-CM | POA: Diagnosis not present

## 2020-01-20 DIAGNOSIS — R7303 Prediabetes: Secondary | ICD-10-CM | POA: Diagnosis not present

## 2020-01-20 DIAGNOSIS — N8110 Cystocele, unspecified: Secondary | ICD-10-CM | POA: Diagnosis not present

## 2020-01-20 DIAGNOSIS — E785 Hyperlipidemia, unspecified: Secondary | ICD-10-CM | POA: Diagnosis not present

## 2020-01-20 DIAGNOSIS — N8189 Other female genital prolapse: Principal | ICD-10-CM | POA: Insufficient documentation

## 2020-01-20 DIAGNOSIS — N814 Uterovaginal prolapse, unspecified: Secondary | ICD-10-CM | POA: Diagnosis not present

## 2020-01-20 DIAGNOSIS — N819 Female genital prolapse, unspecified: Secondary | ICD-10-CM | POA: Diagnosis not present

## 2020-01-20 HISTORY — PX: CYSTOSCOPY: SHX5120

## 2020-01-20 HISTORY — PX: LAPAROSCOPIC VAGINAL HYSTERECTOMY WITH SALPINGO OOPHORECTOMY: SHX6681

## 2020-01-20 HISTORY — PX: ANTERIOR AND POSTERIOR REPAIR WITH SACROSPINOUS FIXATION: SHX6536

## 2020-01-20 LAB — CBC
HCT: 30.5 % — ABNORMAL LOW (ref 36.0–46.0)
HCT: 27.9 % — ABNORMAL LOW (ref 36.0–46.0)
Hemoglobin: 9.9 g/dL — ABNORMAL LOW (ref 12.0–15.0)
Hemoglobin: 9 g/dL — ABNORMAL LOW (ref 12.0–15.0)
MCH: 30.7 pg (ref 26.0–34.0)
MCH: 30.6 pg (ref 26.0–34.0)
MCHC: 32.5 g/dL (ref 30.0–36.0)
MCHC: 32.3 g/dL (ref 30.0–36.0)
MCV: 94.7 fL (ref 80.0–100.0)
MCV: 94.9 fL (ref 80.0–100.0)
Platelets: 264 10*3/uL (ref 150–400)
Platelets: 259 10*3/uL (ref 150–400)
RBC: 3.22 MIL/uL — ABNORMAL LOW (ref 3.87–5.11)
RBC: 2.94 MIL/uL — ABNORMAL LOW (ref 3.87–5.11)
RDW: 13.5 % (ref 11.5–15.5)
RDW: 13.3 % (ref 11.5–15.5)
WBC: 11.8 10*3/uL — ABNORMAL HIGH (ref 4.0–10.5)
WBC: 9.9 10*3/uL (ref 4.0–10.5)
nRBC: 0 % (ref 0.0–0.2)
nRBC: 0 % (ref 0.0–0.2)

## 2020-01-20 LAB — TYPE AND SCREEN
ABO/RH(D): O POS
Antibody Screen: NEGATIVE

## 2020-01-20 SURGERY — HYSTERECTOMY, VAGINAL, LAPAROSCOPY-ASSISTED, WITH SALPINGO-OOPHORECTOMY
Anesthesia: General | Site: Vagina

## 2020-01-20 MED ORDER — SODIUM CHLORIDE 0.9% FLUSH: 9.0000 mL | INTRAVENOUS | Status: DC | PRN

## 2020-01-20 MED ORDER — LACTATED RINGERS IV SOLN: INTRAVENOUS | Status: DC

## 2020-01-20 MED ORDER — MENTHOL 3 MG MT LOZG: 1.0000 | LOZENGE | OROMUCOSAL | Status: DC | PRN

## 2020-01-20 MED ORDER — BUPIVACAINE HCL (PF) 0.25 % IJ SOLN
INTRAMUSCULAR | Status: DC | PRN
  Administered 2020-01-20: 3 mL

## 2020-01-20 MED ORDER — CEFAZOLIN SODIUM-DEXTROSE 2-4 GM/100ML-% IV SOLN
2.0000 g | INTRAVENOUS | Status: AC
  Administered 2020-01-20: 2 g via INTRAVENOUS

## 2020-01-20 MED ORDER — ONDANSETRON HCL 4 MG/2ML IJ SOLN
4.0000 mg | Freq: Four times a day (QID) | INTRAMUSCULAR | Status: DC | PRN
  Administered 2020-01-21: 4 mg via INTRAVENOUS

## 2020-01-20 MED ORDER — ONDANSETRON HCL 4 MG/2ML IJ SOLN: 4.0000 mg | Freq: Four times a day (QID) | INTRAMUSCULAR | Status: DC | PRN

## 2020-01-20 MED ORDER — PROPOFOL 10 MG/ML IV BOLUS
INTRAVENOUS | Status: AC
  Filled 2020-01-20: qty 20

## 2020-01-20 MED ORDER — DEXAMETHASONE SODIUM PHOSPHATE 10 MG/ML IJ SOLN
INTRAMUSCULAR | Status: DC | PRN
  Administered 2020-01-20: 10 mg via INTRAVENOUS

## 2020-01-20 MED ORDER — PROPOFOL 10 MG/ML IV BOLUS
INTRAVENOUS | Status: DC | PRN
  Administered 2020-01-20: 180 mg via INTRAVENOUS

## 2020-01-20 MED ORDER — NALOXONE HCL 0.4 MG/ML IJ SOLN: 0.4000 mg | INTRAMUSCULAR | Status: DC | PRN

## 2020-01-20 MED ORDER — HYDROMORPHONE 1 MG/ML IV SOLN
INTRAVENOUS | Status: DC
  Administered 2020-01-20: 30 mg via INTRAVENOUS
  Administered 2020-01-20: 2.1 mg via INTRAVENOUS
  Filled 2020-01-20: qty 30

## 2020-01-20 MED ORDER — ONDANSETRON HCL 4 MG PO TABS: 4.0000 mg | ORAL_TABLET | Freq: Four times a day (QID) | ORAL | Status: DC | PRN

## 2020-01-20 MED ORDER — FENTANYL CITRATE (PF) 100 MCG/2ML IJ SOLN
INTRAMUSCULAR | Status: DC | PRN
  Administered 2020-01-20: 50 ug via INTRAVENOUS
  Administered 2020-01-20 (×2): 25 ug via INTRAVENOUS
  Administered 2020-01-20 (×2): 50 ug via INTRAVENOUS

## 2020-01-20 MED ORDER — SILVER NITRATE-POT NITRATE 75-25 % EX MISC: 1.0000 | Freq: Once | CUTANEOUS | Status: DC

## 2020-01-20 MED ORDER — LABETALOL HCL 5 MG/ML IV SOLN
INTRAVENOUS | Status: AC
  Filled 2020-01-20: qty 4

## 2020-01-20 MED ORDER — ONDANSETRON HCL 4 MG/2ML IJ SOLN: 4.0000 mg | Freq: Once | INTRAMUSCULAR | Status: DC | PRN

## 2020-01-20 MED ORDER — LIDOCAINE-EPINEPHRINE (PF) 1 %-1:200000 IJ SOLN
INTRAMUSCULAR | Status: DC | PRN
  Administered 2020-01-20: 10 mL

## 2020-01-20 MED ORDER — OXYCODONE-ACETAMINOPHEN 5-325 MG PO TABS
ORAL_TABLET | ORAL | Status: AC
  Filled 2020-01-20: qty 1

## 2020-01-20 MED ORDER — LIDOCAINE 2% (20 MG/ML) 5 ML SYRINGE
INTRAMUSCULAR | Status: DC | PRN
  Administered 2020-01-20: 80 mg via INTRAVENOUS

## 2020-01-20 MED ORDER — MIDAZOLAM HCL 2 MG/2ML IJ SOLN
INTRAMUSCULAR | Status: AC
  Filled 2020-01-20: qty 2

## 2020-01-20 MED ORDER — FENTANYL CITRATE (PF) 100 MCG/2ML IJ SOLN
INTRAMUSCULAR | Status: AC
  Filled 2020-01-20: qty 2

## 2020-01-20 MED ORDER — MIDAZOLAM HCL 5 MG/5ML IJ SOLN
INTRAMUSCULAR | Status: DC | PRN
  Administered 2020-01-20: 1 mg via INTRAVENOUS

## 2020-01-20 MED ORDER — OXYCODONE-ACETAMINOPHEN 5-325 MG PO TABS
1.0000 | ORAL_TABLET | ORAL | Status: DC | PRN
  Administered 2020-01-20 – 2020-01-21 (×3): 1 via ORAL
  Administered 2020-01-21: 2 via ORAL

## 2020-01-20 MED ORDER — CEFAZOLIN SODIUM-DEXTROSE 2-4 GM/100ML-% IV SOLN
INTRAVENOUS | Status: AC
  Filled 2020-01-20: qty 100

## 2020-01-20 MED ORDER — SODIUM CHLORIDE 0.9 % IR SOLN
Status: DC | PRN
  Administered 2020-01-20: 200 mL

## 2020-01-20 MED ORDER — LIDOCAINE 2% (20 MG/ML) 5 ML SYRINGE
INTRAMUSCULAR | Status: AC
  Filled 2020-01-20: qty 5

## 2020-01-20 MED ORDER — SILVER NITRATE-POT NITRATE 75-25 % EX MISC
CUTANEOUS | Status: AC
  Filled 2020-01-20: qty 10

## 2020-01-20 MED ORDER — DIPHENHYDRAMINE HCL 12.5 MG/5ML PO ELIX: 12.5000 mg | ORAL_SOLUTION | Freq: Four times a day (QID) | ORAL | Status: DC | PRN

## 2020-01-20 MED ORDER — DEXAMETHASONE SODIUM PHOSPHATE 10 MG/ML IJ SOLN
INTRAMUSCULAR | Status: AC
  Filled 2020-01-20: qty 1

## 2020-01-20 MED ORDER — ONDANSETRON HCL 4 MG/2ML IJ SOLN
INTRAMUSCULAR | Status: DC | PRN
  Administered 2020-01-20: 4 mg via INTRAVENOUS

## 2020-01-20 MED ORDER — ROCURONIUM BROMIDE 10 MG/ML (PF) SYRINGE
PREFILLED_SYRINGE | INTRAVENOUS | Status: DC | PRN
  Administered 2020-01-20: 10 mg via INTRAVENOUS
  Administered 2020-01-20: 50 mg via INTRAVENOUS

## 2020-01-20 MED ORDER — ROCURONIUM BROMIDE 10 MG/ML (PF) SYRINGE
PREFILLED_SYRINGE | INTRAVENOUS | Status: AC
  Filled 2020-01-20: qty 10

## 2020-01-20 MED ORDER — OXYCODONE HCL 5 MG PO TABS: 5.0000 mg | ORAL_TABLET | Freq: Once | ORAL | Status: DC | PRN

## 2020-01-20 MED ORDER — SODIUM CHLORIDE 0.9 % IR SOLN
Status: DC | PRN
  Administered 2020-01-20: 200 mL via INTRAVESICAL

## 2020-01-20 MED ORDER — LABETALOL HCL 5 MG/ML IV SOLN
INTRAVENOUS | Status: DC | PRN
  Administered 2020-01-20: 5 mg via INTRAVENOUS

## 2020-01-20 MED ORDER — DIPHENHYDRAMINE HCL 50 MG/ML IJ SOLN: 12.5000 mg | Freq: Four times a day (QID) | INTRAMUSCULAR | Status: DC | PRN

## 2020-01-20 MED ORDER — FENTANYL CITRATE (PF) 100 MCG/2ML IJ SOLN
25.0000 ug | INTRAMUSCULAR | Status: DC | PRN
  Administered 2020-01-20 (×4): 25 ug via INTRAVENOUS

## 2020-01-20 MED ORDER — OXYCODONE HCL 5 MG/5ML PO SOLN: 5.0000 mg | Freq: Once | ORAL | Status: DC | PRN

## 2020-01-20 MED ORDER — ONDANSETRON HCL 4 MG/2ML IJ SOLN
INTRAMUSCULAR | Status: AC
  Filled 2020-01-20: qty 2

## 2020-01-20 MED ORDER — SUGAMMADEX SODIUM 200 MG/2ML IV SOLN
INTRAVENOUS | Status: DC | PRN
  Administered 2020-01-20: 150 mg via INTRAVENOUS

## 2020-01-20 SURGICAL SUPPLY — 86 items
ADH SKN CLS APL DERMABOND .7 (GAUZE/BANDAGES/DRESSINGS) ×2
BAG DRN RND TRDRP ANRFLXCHMBR (UROLOGICAL SUPPLIES)
BAG RETRIEVAL 10 (BASKET)
BAG URINE DRAIN 2000ML AR STRL (UROLOGICAL SUPPLIES) ×5 IMPLANT
BLADE CLIPPER SENSICLIP SURGIC (BLADE) ×1 IMPLANT
BLADE SURG 15 STRL LF DISP TIS (BLADE) ×5 IMPLANT
BLADE SURG 15 STRL SS (BLADE) ×4
BNDG GAUZE ELAST 4 BULKY (GAUZE/BANDAGES/DRESSINGS) ×1 IMPLANT
CANISTER SUCT 3000ML PPV (MISCELLANEOUS) ×11 IMPLANT
CATH ROBINSON RED A/P 16FR (CATHETERS) ×6 IMPLANT
COVER BACK TABLE 60X90IN (DRAPES) ×6 IMPLANT
COVER MAYO STAND STRL (DRAPES) ×12 IMPLANT
COVER SURGICAL LIGHT HANDLE (MISCELLANEOUS) IMPLANT
COVER WAND RF STERILE (DRAPES) ×6 IMPLANT
DECANTER SPIKE VIAL GLASS SM (MISCELLANEOUS) IMPLANT
DERMABOND ADVANCED (GAUZE/BANDAGES/DRESSINGS) ×1
DERMABOND ADVANCED .7 DNX12 (GAUZE/BANDAGES/DRESSINGS) ×5 IMPLANT
DEVICE CAPIO SLIM SINGLE (INSTRUMENTS) ×1 IMPLANT
DRSG COVADERM PLUS 2X2 (GAUZE/BANDAGES/DRESSINGS) IMPLANT
DRSG OPSITE POSTOP 3X4 (GAUZE/BANDAGES/DRESSINGS) ×6 IMPLANT
DURAPREP 26ML APPLICATOR (WOUND CARE) ×6 IMPLANT
ELECT REM PT RETURN 9FT ADLT (ELECTROSURGICAL) ×4
ELECTRODE REM PT RTRN 9FT ADLT (ELECTROSURGICAL) ×5 IMPLANT
GAUZE 4X4 16PLY RFD (DISPOSABLE) ×6 IMPLANT
GLOVE BIO SURGEON STRL SZ7 (GLOVE) ×24 IMPLANT
GLOVE ECLIPSE 6.5 STRL STRAW (GLOVE) ×6 IMPLANT
GOWN STRL REUS W/ TWL XL LVL3 (GOWN DISPOSABLE) ×5 IMPLANT
GOWN STRL REUS W/TWL XL LVL3 (GOWN DISPOSABLE) ×10 IMPLANT
HOLDER FOLEY CATH W/STRAP (MISCELLANEOUS) ×6 IMPLANT
IV NS 1000ML (IV SOLUTION) ×4
IV NS 1000ML BAXH (IV SOLUTION) IMPLANT
IV NS IRRIG 3000ML ARTHROMATIC (IV SOLUTION) ×1 IMPLANT
KIT TURNOVER CYSTO (KITS) ×6 IMPLANT
NDL MAYO 6 CRC TAPER PT (NEEDLE) ×5 IMPLANT
NEEDLE HYPO 22GX1.5 SAFETY (NEEDLE) ×6 IMPLANT
NEEDLE INSUFFLATION 120MM (ENDOMECHANICALS) ×1 IMPLANT
NEEDLE MAYO 6 CRC TAPER PT (NEEDLE) ×4 IMPLANT
NS IRRIG 500ML POUR BTL (IV SOLUTION) ×6 IMPLANT
PACK LAVH (CUSTOM PROCEDURE TRAY) ×6 IMPLANT
PACK ROBOTIC GOWN (GOWN DISPOSABLE) ×1 IMPLANT
PACK TRENDGUARD 450 HYBRID PRO (MISCELLANEOUS) IMPLANT
PACK VAGINAL WOMENS (CUSTOM PROCEDURE TRAY) ×6 IMPLANT
PAD OB MATERNITY 4.3X12.25 (PERSONAL CARE ITEMS) ×6 IMPLANT
PAD PREP 24X48 CUFFED NSTRL (MISCELLANEOUS) ×6 IMPLANT
SCISSORS LAP 5X45 EPIX DISP (ENDOMECHANICALS) IMPLANT
SEALER TISSUE G2 CVD JAW 45CM (ENDOMECHANICALS) ×6 IMPLANT
SET IRRIG Y TYPE TUR BLADDER L (SET/KITS/TRAYS/PACK) ×6 IMPLANT
SET SUCTION IRRIG HYDROSURG (IRRIGATION / IRRIGATOR) ×6 IMPLANT
SET TUBE SMOKE EVAC HIGH FLOW (TUBING) ×6 IMPLANT
SOLUTION ELECTROLUBE (MISCELLANEOUS) IMPLANT
STRIP CLOSURE SKIN 1/4X4 (GAUZE/BANDAGES/DRESSINGS) IMPLANT
SUT CAPIO POLYGLYCOLIC (SUTURE) ×2 IMPLANT
SUT CHROMIC 2 0 SH (SUTURE) IMPLANT
SUT CHROMIC 3 0 SH 27 (SUTURE) ×6 IMPLANT
SUT GUT CHROMIC 3 0 (SUTURE) IMPLANT
SUT MON AB 3-0 SH 27 (SUTURE)
SUT MON AB 3-0 SH27 (SUTURE) IMPLANT
SUT VIC AB 0 CT1 18XCR BRD8 (SUTURE) ×10 IMPLANT
SUT VIC AB 0 CT1 36 (SUTURE) ×12 IMPLANT
SUT VIC AB 0 CT1 8-18 (SUTURE) ×8
SUT VIC AB 2-0 CT1 (SUTURE) ×5 IMPLANT
SUT VIC AB 2-0 CT1 27 (SUTURE)
SUT VIC AB 2-0 CT1 TAPERPNT 27 (SUTURE) IMPLANT
SUT VIC AB 2-0 CT2 27 (SUTURE) IMPLANT
SUT VIC AB 2-0 SH 27 (SUTURE)
SUT VIC AB 2-0 SH 27XBRD (SUTURE) IMPLANT
SUT VIC AB 2-0 UR6 27 (SUTURE) ×6 IMPLANT
SUT VIC AB 3-0 SH 27 (SUTURE)
SUT VIC AB 3-0 SH 27X BRD (SUTURE) IMPLANT
SUT VICRYL 0 UR6 27IN ABS (SUTURE) ×1 IMPLANT
SUT VICRYL 1 TIES 12X18 (SUTURE) ×6 IMPLANT
SUT VICRYL 4-0 PS2 18IN ABS (SUTURE) ×7 IMPLANT
SYR BULB IRRIG 60ML STRL (SYRINGE) IMPLANT
SYS BAG RETRIEVAL 10MM (BASKET)
SYSTEM BAG RETRIEVAL 10MM (BASKET) IMPLANT
TOWEL OR 17X26 10 PK STRL BLUE (TOWEL DISPOSABLE) ×11 IMPLANT
TRAY FOLEY W/BAG SLVR 14FR (SET/KITS/TRAYS/PACK) ×6 IMPLANT
TRENDGUARD 450 HYBRID PRO PACK (MISCELLANEOUS) ×4
TROCAR BALLN 12MMX100 BLUNT (TROCAR) ×1 IMPLANT
TROCAR BLADELESS OPT 12M 100M (ENDOMECHANICALS) IMPLANT
TROCAR OPTI TIP 5M 100M (ENDOMECHANICALS) ×6 IMPLANT
TROCAR XCEL DIL TIP R 11M (ENDOMECHANICALS) IMPLANT
TUBE CONNECTING 12X1/4 (SUCTIONS) ×1 IMPLANT
WARMER LAPAROSCOPE (MISCELLANEOUS) ×6 IMPLANT
WATER STERILE IRR 3000ML UROMA (IV SOLUTION) ×5 IMPLANT
WATER STERILE IRR 500ML POUR (IV SOLUTION) ×5 IMPLANT

## 2020-01-20 NOTE — Brief Op Note (Signed)
01/20/2020  10:07 AM  PATIENT:  Amanda Curry  69 y.o. female  PRE-OPERATIVE DIAGNOSIS:  PELVIC PROLAPSE  POST-OPERATIVE DIAGNOSIS:  PELVIC PROLAPSE  PROCEDURE:  Procedure(s) with comments: LAPAROSCOPIC ASSISTED VAGINAL HYSTERECTOMY WITH SALPINGO OOPHORECTOMY (Bilateral) - NEED BED ANTERIOR AND POSTERIOR REPAIR WITH SACROSPINOUS FIXATION (N/A) CYSTOSCOPY (N/A)  SURGEON:  Surgeon(s) and Role:    * [name], MD - Primary    * Everlene Farrier, MD - Assisting  PHYSICIAN ASSISTANT:   ASSISTANTS: tomblin    ANESTHESIA:   local and general  EBL:  200 mL   BLOOD ADMINISTERED:none  DRAINS: Urinary Catheter (Foley)   LOCAL MEDICATIONS USED:  MARCAINE     SPECIMEN:  Source of Specimen:  uterus tubes and ovaries  DISPOSITION OF SPECIMEN:  PATHOLOGY  COUNTS:  YES   TOURNIQUET:  * No tourniquets in log *  DICTATION: .Other Dictation: Dictation Number [id number]  PLAN OF CARE: Admit for overnight observation  PATIENT DISPOSITION:  PACU - hemodynamically stable.   Delay start of Pharmacological VTE agent (>24hrs) due to surgical blood loss or risk of bleeding: no

## 2020-01-20 NOTE — Progress Notes (Signed)
Patient ID: Amanda Curry, female   DOB: 07-16-1951, 69 y.o.   MRN: [id number] Patient having increased vaginal bleeding Pack removed not soaked Speculum exam no active bleeding sites uo adequate Rectal exam with perirectal hematoma  2 cm in size Pack replaced cbe pending  Will follow bleeding

## 2020-01-20 NOTE — Anesthesia Postprocedure Evaluation (Signed)
Anesthesia Post Note  Patient: ANGELLYNN PIMM  Procedure(s) Performed: LAPAROSCOPIC ASSISTED VAGINAL HYSTERECTOMY WITH SALPINGO OOPHORECTOMY (Bilateral Vagina ) ANTERIOR AND POSTERIOR REPAIR WITH SACROSPINOUS FIXATION (N/A Vagina ) CYSTOSCOPY (N/A Bladder)     Patient location during evaluation: PACU Anesthesia Type: General Level of consciousness: awake and alert Pain management: pain level controlled Vital Signs Assessment: post-procedure vital signs reviewed and stable Respiratory status: spontaneous breathing, nonlabored ventilation and respiratory function stable Cardiovascular status: blood pressure returned to baseline and stable Postop Assessment: no apparent nausea or vomiting Anesthetic complications: no    Last Vitals:  Vitals:   01/20/20 1100 01/20/20 1115  BP: 140/88 (!) 144/82  Pulse: 68 78  Resp: (!) 9 16  Temp:  36.6 C  SpO2: 97% 100%    Last Pain:  Vitals:   01/20/20 1115  TempSrc:   PainSc: 5                  Lidia Collum

## 2020-01-20 NOTE — Op Note (Signed)
NAME: Amanda Curry, Amanda Curry MEDICAL RECORD [id number] ACCOUNT [id number] DATE OF BIRTH:[birth year] FACILITY: [facility] LOCATION: [facility] PHYSICIAN:Amyrie Illingworth Sherran Needs, MD  OPERATIVE REPORT  DATE OF PROCEDURE:  01/20/2020  PREOPERATIVE DIAGNOSES:  Symptomatic pelvic relaxation.  POSTOPERATIVE DIAGNOSES:  Symptomatic pelvic relaxation.  OPERATIVE PROCEDURES:  Laparoscopic assisted vaginal hysterectomy with bilateral salpingo-oophorectomy.  Anterior and posterior repair.  Sacrospinous ligament suspension.  Cystoscopy.  SURGEON:  Darlyn Chamber, MD  ASSISTANTGertie Fey   ANESTHESIA:  General endotracheal.  ESTIMATED BLOOD LOSS:  200 mL.  PACKS:  Include a vaginal pack.  DRAINS:  Included urethral Foley.  INTRAOPERATIVE BLOOD REPLACEMENT:  None.  COMPLICATIONS:  None.  INDICATIONS:  Dictated in history and physical.  DESCRIPTION OF PROCEDURE:  The patient was taken to the OR and placed in supine position.  After satisfactory level of general endotracheal anesthesia was obtained, the patient was placed in the dorsal lithotomy position using the Allen stirrups.   Perineum and vagina were prepped out with Hibiclens.  Bladder was entered without catheterization.  A Hulka tenaculum was put in place and secured.  The patient's abdomen was prepped with DuraPrep.  After a period of time patient was draped in sterile  field.  Subumbilical incision was made with a knife and extended through subcutaneous tissue.  The anterior rectus fascia was entered sharply and incision fashion laterally.  Peritoneum was entered with blunt finger pressure.  The open laparoscopic  trocar was put in place and secured.  Abdomen was inflated with carbon dioxide.  Laparoscope was introduced.  Visualization revealed no evidence of injury to adjacent organs.  The upper abdomen including liver tip the gallbladder, both lateral gutters  were clear.  This did include the appendix.  A 5 mm trocar was put in place in the suprapubic  area under direct visualization.  Uterus was small.  Tubes and ovaries were unremarkable.  First, the right tube was elevated.  The right ureter was identified.   Using the EnSeal, the right ovarian vasculature was cauterized and incised.  The peritoneal attachments of the right tube and ovary were cauterized and incised up to the right round ligament.  The right round ligament was then cauterized and incised.   We then went to the left side.  Left tube and ovary were elevated.  Left ureter was identified.  The left ovarian vasculature was cauterized and incised.  The peritoneal attachments of the left tube and ovary were cauterized and incised up to the left  round ligament.  Left round ligament was then cauterized and incised.  We had good hemostasis.  Decision was to go vaginally.  The laparoscope was then removed.  The abdomen deflated of carbon dioxide.  Legs were repositioned.  Weighted speculum was placed in the vaginal vault.  The Hulka tenaculum was then removed.  Jacobs tenaculum was used to grasp the cervix.  Cul-de-sac was  entered sharply.  Both uterosacral ligaments were clamped, cut and suture ligated with 0 Vicryl.  The reflection of the vaginal mucosa anteriorly was incised and the bladder was dissected superiorly.  Both the paracervical tissue was then clamped, cut  and suture ligated with 0 Vicryl.  The vesicouterine space was identified and entered sharply and retractor was wiped retracted superiorly out of the operative field.  Using the clamp, cut, and tie technique with suture ligatures of 0 Vicryl, the  parametrium was serially separated from the sides of the uterus.  Uterus then flipped and remaining pedicles were clamped and cut.  The  uterus, tubes and ovaries were passed off the operative field.  Held pedicles secured with free ties of 0 Vicryl.   Posterior vaginal cuff was then run with a running locking suture of 0 Vicryl.  The uterosacral plication stitch of 0 Vicryl was then  put in place and secured.  Areas of bleeding were brought under control with figure-of-eights of 0 Vicryl.  Attention was now turned to the cystocele.  The vaginal mucosa was infiltrated with 1% Xylocaine with epinephrine.  An incision was made in the midline from just below the urethra to the vaginal opening with a hysterectomy then done.  Using an Allis,  this area was elevated.  Using sharp and blunt dissection, the pubocervical fascia was separated from the overlying vaginal mucosa until we completely freed up the bladder.  At this point in time, we reapproximated the pubocervical fascia in the midline  with interrupted sutures of 3-0 Vicryl.  We had good reapproximation elevation of the cystocele.  Once this was done, vaginal mucosa was then trimmed.  Vaginal mucosa was then closed with sutures of 2-0 Vicryl.  Vaginal mucosa and the vaginal opening was  then closed with interrupted sutures of 0 Vicryl.  We had good hemostasis and approximation of the vaginal mucosa.  Attention was now turned to the posterior repair.  The V incision was made with a knife over the perineal body and the skin was dissected up to the vaginal opening.  It was then excised.  The vaginal mucosa was infiltrated with 1% Xylocaine with  epinephrine.  It was elevated.  Using blunt and sharp dissection, the underlying perirectal fascia was separated from the overlying vaginal mucosa.  We then incised the vaginal mucosa.  We continued the sharp and blunt dissection until the rectum was  completely freed from the overlying vaginal mucosa.  We then dissected out to the right side to the uterosacral plication area.  The uterosacral ligament was easily identified.  Using the Capio a suture of 0 Vicryl was placed through the uterosacral  ligament.  It was then secured to the top of the vaginal mucosa with figure-of-8 with Mayo needle.  The suture was then held.  Perirectal fascia was brought together in midline with interrupted sutures  of 2-0 Vicryl until the rectocele was completely  obliterated.  Midline incision was then made in the vaginal mucosa.  We began reapproximating the vaginal mucosa about halfway down.  We then secured the uterosacral plication stitch with good elevation of the vaginal angle.  The remaining vaginal mucosa  was closed with interrupted sutures of 2-0 Vicryl.  The perineum was then reconstructed and the skin was closed with running subcuticular of 0 Vicryl.  We had good reapproximation and obliteration of the rectocele.  Rectal exam was unremarkable.   Cystoscopy was then performed.  There was no evidence of injury to the bladder.  Both ureteral orifices were identified and noted to be spilling jets of clear urine.  Foley was placed to straight drain.  A speculum was then placed back into the pelvis.  Visualization revealed some oozing from the vaginal cuff brought under control with the EnSeal.  We thoroughly irrigated the pelvis and we had good hemostasis at this point.  The abdomen was deflated of  carbon dioxide.  All trocars were removed.  Subumbilical fascia was closed with 2 figure-of-eights of 0 Vicryl.  Skin was closed with interrupted subcuticulars of 3-0 Vicryl.  Suprapubic incision closed with Dermabond.  Vaginal packing was put  in place.   Rectal exam was again unremarkable.  A Foley was placed to straight drain with clear urine being obtained.  The patient was then taken out of dorsal lithotomy position once alert and extubated and transferred to recovery room in good condition.  Sponge,  instrument and needle counts were correct by the circulating nurse numerous times.  CN/NUANCE  D:01/20/2020 T:01/20/2020 JOB:[id number]

## 2020-01-20 NOTE — Progress Notes (Signed)
Assisted Dr Radene Knee bedside vaginal exam.  Packing removed speculum exam was done.  Packing was replaced RN will continue to assess patient.  CBC will be repeated at 2000.

## 2020-01-20 NOTE — Transfer of Care (Signed)
Immediate Anesthesia Transfer of Care Note  Patient: Amanda Curry  Procedure(s) Performed: LAPAROSCOPIC ASSISTED VAGINAL HYSTERECTOMY WITH SALPINGO OOPHORECTOMY (Bilateral Vagina ) ANTERIOR AND POSTERIOR REPAIR WITH SACROSPINOUS FIXATION (N/A Vagina ) CYSTOSCOPY (N/A Bladder)  Patient Location: PACU  Anesthesia Type:General  Level of Consciousness: awake, alert  and oriented  Airway & Oxygen Therapy: Patient Spontanous Breathing and Patient connected to nasal cannula oxygen  Post-op Assessment: Report given to RN and Post -op Vital signs reviewed and stable  Post vital signs: Reviewed and stable  Last Vitals:  Vitals Value Taken Time  BP 144/84 01/20/20 1000  Temp    Pulse 85 01/20/20 1003  Resp 11 01/20/20 1003  SpO2 100 % 01/20/20 1003  Vitals shown include unvalidated device data.  Last Pain:  Vitals:   01/20/20 0546  TempSrc: Oral         Complications: No apparent anesthesia complications

## 2020-01-20 NOTE — Anesthesia Procedure Notes (Signed)
Procedure Name: Intubation Date/Time: 01/20/2020 7:31 AM Performed by: Rocky Gladden D, CRNA Pre-anesthesia Checklist: Patient identified, Emergency Drugs available, Suction available and Patient being monitored Patient Re-evaluated:Patient Re-evaluated prior to induction Oxygen Delivery Method: Circle system utilized Preoxygenation: Pre-oxygenation with 100% oxygen Induction Type: IV induction Ventilation: Mask ventilation without difficulty Laryngoscope Size: Mac and 3 Tube type: Oral Tube size: 7.5 mm Number of attempts: 1 Airway Equipment and Method: Stylet Placement Confirmation: ETT inserted through vocal cords under direct vision,  positive ETCO2 and breath sounds checked- equal and bilateral Secured at: 20 cm Tube secured with: Tape Dental Injury: Teeth and Oropharynx as per pre-operative assessment

## 2020-01-20 NOTE — Anesthesia Preprocedure Evaluation (Signed)
Anesthesia Evaluation  Patient identified by MRN, date of birth, ID band Patient awake    Reviewed: Allergy & Precautions, NPO status , Patient's Chart, lab work & pertinent test results  History of Anesthesia Complications Negative for: history of anesthetic complications  Airway Mallampati: I  TM Distance: >3 FB Neck ROM: Full    Dental  (+) Teeth Intact   Pulmonary neg pulmonary ROS,    Pulmonary exam normal        Cardiovascular hypertension, Normal cardiovascular exam     Neuro/Psych negative neurological ROS  negative psych ROS   GI/Hepatic Neg liver ROS, GERD  ,  Endo/Other  negative endocrine ROS  Renal/GU negative Renal ROS  negative genitourinary   Musculoskeletal  (+) Arthritis ,   Abdominal   Peds  Hematology negative hematology ROS (+)   Anesthesia Other Findings  Hgb 12.3  Reproductive/Obstetrics                            Anesthesia Physical Anesthesia Plan  ASA: II  Anesthesia Plan: General   Post-op Pain Management:    Induction: Intravenous  PONV Risk Score and Plan: 4 or greater and Ondansetron, Dexamethasone, Treatment may vary due to age or medical condition and Midazolam  Airway Management Planned: Oral ETT  Additional Equipment: None  Intra-op Plan:   Post-operative Plan: Extubation in OR  Informed Consent: I have reviewed the patients History and Physical, chart, labs and discussed the procedure including the risks, benefits and alternatives for the proposed anesthesia with the patient or authorized representative who has indicated his/her understanding and acceptance.     Dental advisory given  Plan Discussed with:   Anesthesia Plan Comments:        Anesthesia Quick Evaluation

## 2020-01-20 NOTE — H&P (Signed)
  History and physical exam unchanged 

## 2020-01-21 DIAGNOSIS — N8189 Other female genital prolapse: Secondary | ICD-10-CM | POA: Diagnosis not present

## 2020-01-21 LAB — CBC
HCT: 24.7 % — ABNORMAL LOW (ref 36.0–46.0)
HCT: 23.9 % — ABNORMAL LOW (ref 36.0–46.0)
Hemoglobin: 8 g/dL — ABNORMAL LOW (ref 12.0–15.0)
Hemoglobin: 8 g/dL — ABNORMAL LOW (ref 12.0–15.0)
MCH: 30.3 pg (ref 26.0–34.0)
MCH: 31.6 pg (ref 26.0–34.0)
MCHC: 32.4 g/dL (ref 30.0–36.0)
MCHC: 33.5 g/dL (ref 30.0–36.0)
MCV: 93.6 fL (ref 80.0–100.0)
MCV: 94.5 fL (ref 80.0–100.0)
Platelets: 237 10*3/uL (ref 150–400)
Platelets: 233 10*3/uL (ref 150–400)
RBC: 2.64 MIL/uL — ABNORMAL LOW (ref 3.87–5.11)
RBC: 2.53 MIL/uL — ABNORMAL LOW (ref 3.87–5.11)
RDW: 13.6 % (ref 11.5–15.5)
RDW: 13.6 % (ref 11.5–15.5)
WBC: 8.5 10*3/uL (ref 4.0–10.5)
WBC: 8.8 10*3/uL (ref 4.0–10.5)
nRBC: 0 % (ref 0.0–0.2)
nRBC: 0 % (ref 0.0–0.2)

## 2020-01-21 LAB — SURGICAL PATHOLOGY

## 2020-01-21 MED ORDER — OXYCODONE-ACETAMINOPHEN 5-325 MG PO TABS
ORAL_TABLET | ORAL | Status: AC
  Filled 2020-01-21: qty 2

## 2020-01-21 MED ORDER — ONDANSETRON HCL 4 MG/2ML IJ SOLN
INTRAMUSCULAR | Status: AC
  Filled 2020-01-21: qty 2

## 2020-01-21 MED ORDER — TRAMADOL-ACETAMINOPHEN 37.5-325 MG PO TABS: 1.0000 | ORAL_TABLET | Freq: Four times a day (QID) | ORAL | 0 refills | Status: AC | PRN

## 2020-01-21 MED ORDER — TRAMADOL HCL 50 MG PO TABS
ORAL_TABLET | ORAL | Status: AC
  Filled 2020-01-21: qty 1

## 2020-01-21 MED ORDER — TRAMADOL HCL 50 MG PO TABS
50.0000 mg | ORAL_TABLET | Freq: Four times a day (QID) | ORAL | Status: DC | PRN
  Administered 2020-01-21: 50 mg via ORAL

## 2020-01-21 MED ORDER — OXYCODONE-ACETAMINOPHEN 5-325 MG PO TABS
ORAL_TABLET | ORAL | Status: AC
  Filled 2020-01-21: qty 1

## 2020-01-21 MED ORDER — SPIRONOLACTONE 25 MG PO TABS
50.0000 mg | ORAL_TABLET | ORAL | Status: AC
  Administered 2020-01-21: 50 mg via ORAL
  Filled 2020-01-21: qty 2

## 2020-01-21 NOTE — Progress Notes (Signed)
Spoke with Dr Radene Knee about patient she was able to void 176ml.  He said to discharge home without catheter and he will see her in the morning.

## 2020-01-21 NOTE — Discharge Instructions (Signed)
Laparoscopically Assisted Vaginal Hysterectomy, Care After This sheet gives you information about how to care for yourself after your procedure. Your health care provider may also give you more specific instructions. If you have problems or questions, contact your health care provider. What can I expect after the procedure? After the procedure, it is common to have:  Soreness and numbness in your incision areas.  Abdominal pain. You will be given pain medicine to control it.  Vaginal bleeding and discharge. You will need to use a sanitary napkin after this procedure.  Sore throat from the breathing tube that was inserted during surgery. Follow these instructions at home: Medicines  Take over-the-counter and prescription medicines only as told by your health care provider.  Do not take aspirin or ibuprofen. These medicines can cause bleeding.  Do not drive or use heavy machinery while taking prescription pain medicine.  Do not drive for 24 hours if you were given a medicine to help you relax (sedative) during the procedure. Incision care   Follow instructions from your health care provider about how to take care of your incisions. Make sure you: ? Wash your hands with soap and water before you change your bandage (dressing). If soap and water are not available, use hand sanitizer. ? Change your dressing as told by your health care provider. ? Leave stitches (sutures), skin glue, or adhesive strips in place. These skin closures may need to stay in place for 2 weeks or longer. If adhesive strip edges start to loosen and curl up, you may trim the loose edges. Do not remove adhesive strips completely unless your health care provider tells you to do that.  Check your incision area every day for signs of infection. Check for: ? Redness, swelling, or pain. ? Fluid or blood. ? Warmth. ? Pus or a bad smell. Activity  Get regular exercise as told by your health care provider. You may be  told to take short walks every day and go farther each time.  Return to your normal activities as told by your health care provider. Ask your health care provider what activities are safe for you.  Do not douche, use tampons, or have sexual intercourse for at least 6 weeks, or until your health care provider gives you permission.  Do not lift anything that is heavier than 10 lb (4.5 kg), or the limit that your health care provider tells you, until he or she says that it is safe. General instructions  Do not take baths, swim, or use a hot tub until your health care provider approves. Take showers instead of baths.  Do not drive for 24 hours if you received a sedative.  Do not drive or operate heavy machinery while taking prescription pain medicine.  To prevent or treat constipation while you are taking prescription pain medicine, your health care provider may recommend that you: ? Drink enough fluid to keep your urine clear or pale yellow. ? Take over-the-counter or prescription medicines. ? Eat foods that are high in fiber, such as fresh fruits and vegetables, whole grains, and beans. ? Limit foods that are high in fat and processed sugars, such as fried and sweet foods.  Keep all follow-up visits as told by your health care provider. This is important. Contact a health care provider if:  You have signs of infection, such as: ? Redness, swelling, or pain around your incision sites. ? Fluid or blood coming from an incision. ? An incision that feels warm to the   touch. ? Pus or a bad smell coming from an incision.  Your incision breaks open.  Your pain medicine is not helping.  You feel dizzy or light-headed.  You have pain or bleeding when you urinate.  You have persistent nausea and vomiting.  You have blood, pus, or a bad-smelling discharge from your vagina. Get help right away if:  You have a fever.  You have severe abdominal pain.  You have chest pain.  You have  shortness of breath.  You faint.  You have pain, swelling, or redness in your leg.  You have heavy bleeding from your vagina. Summary  After the procedure, it is common to have abdominal pain and vaginal bleeding.  You should not drive or lift heavy objects until your health care provider says that it is safe.  Contact your health care provider if you have any symptoms of infection, excessive vaginal bleeding, nausea, vomiting, or shortness of breath. This information is not intended to replace advice given to you by your health care provider. Make sure you discuss any questions you have with your health care provider. Document Revised: 07/28/2017 Document Reviewed: 10/11/2016 Elsevier Patient Education  2020 [employer]  

## 2020-01-21 NOTE — Progress Notes (Signed)
Spoke with Dr Radene Knee about urinary output.  If patient is unable to void he said to discharge her home with a foley catheter and he will see her in the morning in the office.  Patient was in agreement with this plan.

## 2020-01-21 NOTE — Progress Notes (Signed)
Dilaudid PCA d/c 26 cc wasted Reynold Bowen RN witnessed waste.

## 2020-01-21 NOTE — Discharge Summary (Signed)
Patient name Amanda Curry, vanderkolk DICTATION# [id number] CSN# [id number]  [name], MD 01/21/2020 7:19 AM

## 2020-01-21 NOTE — Discharge Summary (Signed)
NAME: Amanda Curry, Amanda Curry MEDICAL RECORD [id number] ACCOUNT [id number] DATE OF BIRTH:1951-01-29 FACILITY: [facility] LOCATION: [facility] PHYSICIAN:Toryn Mcclinton Sherran Needs, MD  DISCHARGE SUMMARY  DATE OF DISCHARGE:  01/21/2020  DATE OF ADMISSION:  01/20/2020  DATE OF DISCHARGE:  01/21/2020  ADMITTING DIAGNOSIS:  Pelvic relaxation.  DISCHARGE DIAGNOSES:  Pelvic relaxation.  OPERATIVE PROCEDURES:  Laparoscopic assisted vaginal hysterectomy with anterior and posterior repair.  Sacrospinous ligament suspension.  Cystoscopy.  For a complete history and physical, please see dictated note.  HOSPITAL COURSE:  The patient underwent the above noted surgery.  They did report some increasing bleeding.  That afternoon, the pack was removed.  We examined her vaginally.  There was no active bleeding noted.  On rectal exam, she did have a hematoma  on the right side.  It measured approximately 2-2.5 cm.  We reassessed her hemoglobin through the night and the next morning.  Her hemoglobin was 9.9 at 5:11.  It was 9 at 8:00, and it was 8 this morning.  She was able to get up and ambulate.  The  vaginal pack was removed this morning.  It was not sustained.  There was no active bleeding noted.  Her abdomen was soft.  Bowel sounds were active.  Her abdomen was nontender.  Incisions were clear.  Rectal exam revealed the hematoma to be decreasing in  size.  She is going to be discharged home at this time.  COMPLICATIONS:  Are noted above.  The patient was discharged home in stable condition.  DISPOSITION:  Will recheck one more CBC this morning.  If that is stable, we will let her go home.  We will discharge her home on tramadol as needed for pain.  She is to avoid vaginal entrance or heavy lifting.  She should not drive a car.  She is  instructed to call should there be signs of infection in terms of fever.  Heavy vaginal bleeding should be reported.  Nausea, vomiting should be reported.  Excessive abdominal or pelvic pain  should be reported.  Signs and symptoms of deep venous  thrombosis and pulmonary embolus discussed.  The patient expressed understanding of the above instructions.  CN/NUANCE D:01/21/2020 T:01/21/2020 JOB:[id number]

## 2020-01-21 NOTE — Progress Notes (Signed)
Patient instructed to call Dr Radene Knee if she is unable to void after 6 hours.

## 2020-01-21 NOTE — Progress Notes (Signed)
1 Day Post-Op Procedure(s) (LRB): LAPAROSCOPIC ASSISTED VAGINAL HYSTERECTOMY WITH SALPINGO OOPHORECTOMY (Bilateral) ANTERIOR AND POSTERIOR REPAIR WITH SACROSPINOUS FIXATION (N/A) CYSTOSCOPY (N/A)  Subjective: Patient reports nausea.    Objective: I have reviewed patient's vital signs, intake and output and labs.  General: alert GI: soft, non-tender; bowel sounds normal; no masses,  no organomegaly Vaginal Bleeding: none rectal exam hematoma smaller  Assessment: s/p Procedure(s) with comments: LAPAROSCOPIC ASSISTED VAGINAL HYSTERECTOMY WITH SALPINGO OOPHORECTOMY (Bilateral) - NEED BED ANTERIOR AND POSTERIOR REPAIR WITH SACROSPINOUS FIXATION (N/A) CYSTOSCOPY (N/A): stable  Plan: Advance diet Discontinue IV fluids Discharge home  LOS: 0 days    John McComb 01/21/2020, 7:12 AM

## 2020-01-21 NOTE — Progress Notes (Signed)
This note also relates to the following rows which could not be included: Rate - Cannot attach notes to extension rows  Bladder scanned for 168 ml.

## 2020-01-22 DIAGNOSIS — K645 Perianal venous thrombosis: Secondary | ICD-10-CM | POA: Diagnosis not present

## 2020-01-22 DIAGNOSIS — Z09 Encounter for follow-up examination after completed treatment for conditions other than malignant neoplasm: Secondary | ICD-10-CM | POA: Diagnosis not present

## 2020-01-22 DIAGNOSIS — D649 Anemia, unspecified: Secondary | ICD-10-CM | POA: Diagnosis not present

## 2020-01-27 ENCOUNTER — Encounter (HOSPITAL_COMMUNITY): Payer: Self-pay

## 2020-01-27 ENCOUNTER — Other Ambulatory Visit: Payer: Self-pay

## 2020-01-27 ENCOUNTER — Emergency Department (HOSPITAL_COMMUNITY)
Admission: EM | Admit: 2020-01-27 | Discharge: 2020-01-27 | Disposition: A | Discharge status: Home / Self Care | Payer: PPO | Attending: Emergency Medicine | Admitting: Emergency Medicine

## 2020-01-27 DIAGNOSIS — Z79899 Other long term (current) drug therapy: Secondary | ICD-10-CM | POA: Diagnosis not present

## 2020-01-27 DIAGNOSIS — I10 Essential (primary) hypertension: Secondary | ICD-10-CM | POA: Insufficient documentation

## 2020-01-27 DIAGNOSIS — Z90710 Acquired absence of both cervix and uterus: Secondary | ICD-10-CM | POA: Diagnosis not present

## 2020-01-27 DIAGNOSIS — N939 Abnormal uterine and vaginal bleeding, unspecified: Secondary | ICD-10-CM | POA: Insufficient documentation

## 2020-01-27 LAB — CBC WITH DIFFERENTIAL/PLATELET
Abs Immature Granulocytes: 0.03 10*3/uL (ref 0.00–0.07)
Basophils Absolute: 0 10*3/uL (ref 0.0–0.1)
Basophils Relative: 0 %
Eosinophils Absolute: 0.2 10*3/uL (ref 0.0–0.5)
Eosinophils Relative: 2 %
HCT: 27.1 % — ABNORMAL LOW (ref 36.0–46.0)
Hemoglobin: 8.9 g/dL — ABNORMAL LOW (ref 12.0–15.0)
Immature Granulocytes: 0 %
Lymphocytes Relative: 18 %
Lymphs Abs: 1.6 10*3/uL (ref 0.7–4.0)
MCH: 31 pg (ref 26.0–34.0)
MCHC: 32.8 g/dL (ref 30.0–36.0)
MCV: 94.4 fL (ref 80.0–100.0)
Monocytes Absolute: 0.8 10*3/uL (ref 0.1–1.0)
Monocytes Relative: 9 %
Neutro Abs: 6.2 10*3/uL (ref 1.7–7.7)
Neutrophils Relative %: 71 %
Platelets: 375 10*3/uL (ref 150–400)
RBC: 2.87 MIL/uL — ABNORMAL LOW (ref 3.87–5.11)
RDW: 14 % (ref 11.5–15.5)
WBC: 8.8 10*3/uL (ref 4.0–10.5)
nRBC: 0 % (ref 0.0–0.2)

## 2020-01-27 LAB — COMPREHENSIVE METABOLIC PANEL
ALT: 27 U/L (ref 0–44)
AST: 28 U/L (ref 15–41)
Albumin: 3.6 g/dL (ref 3.5–5.0)
Alkaline Phosphatase: 64 U/L (ref 38–126)
Anion gap: 10 (ref 5–15)
BUN: 10 mg/dL (ref 8–23)
CO2: 25 mmol/L (ref 22–32)
Calcium: 9 mg/dL (ref 8.9–10.3)
Chloride: 101 mmol/L (ref 98–111)
Creatinine, Ser: 0.71 mg/dL (ref 0.44–1.00)
GFR calc Af Amer: >60 mL/min (ref >60)
GFR calc non Af Amer: >60 mL/min (ref >60)
Glucose, Bld: 108 mg/dL — ABNORMAL HIGH (ref 70–99)
Potassium: 3.8 mmol/L (ref 3.5–5.1)
Sodium: 136 mmol/L (ref 135–145)
Total Bilirubin: 0.6 mg/dL (ref 0.3–1.2)
Total Protein: 6.5 g/dL (ref 6.5–8.1)

## 2020-01-27 NOTE — ED Triage Notes (Signed)
Pt presents w/dark red vaginal bleeding starting this am. Pt had a vaginal hysterectomy May 24th. Spoke with OBGYN's office today they sent her here for further evaluation. Prior to today pyt reports scant amount of light pink vaginal discharge

## 2020-01-27 NOTE — Discharge Instructions (Signed)
Return to the ER if your bleeding get severe otherwise follow-up with Dr. Renold Don as planned.

## 2020-01-28 DIAGNOSIS — Z09 Encounter for follow-up examination after completed treatment for conditions other than malignant neoplasm: Secondary | ICD-10-CM | POA: Diagnosis not present

## 2020-01-28 DIAGNOSIS — D649 Anemia, unspecified: Secondary | ICD-10-CM | POA: Diagnosis not present

## 2020-02-03 NOTE — ED Provider Notes (Signed)
[facility] EMERGENCY DEPARTMENT Provider Note   CSN: [id number] Arrival date & time: 01/27/20  1653     History Chief Complaint  Patient presents with  . Vaginal Bleeding    Amanda Curry is a 69 y.o. female.  HPI    Pt comes in with cc of vaginal bleeding. Pt had hysterectomy on 24th and was doing well until recent episode of vaginal bleeding. Blood was dark, and has reduced. No pain.  Past Medical History:  Diagnosis Date  . Adenomatous polyps   . Arthritis   . Diverticulosis   . Ganglion of left wrist    thumb joint  . GERD (gastroesophageal reflux disease)   . Hyperlipidemia   . Hypertension   . Pre-diabetes   . Tinnitus     Patient Active Problem List   Diagnosis Date Noted  . Pelvic relaxation 01/20/2020  . S/P laparoscopic assisted vaginal hysterectomy (LAVH) 01/20/2020  . Presence of right artificial knee joint 08/16/2019  . OA (osteoarthritis) of knee 06/24/2019  . Weight loss, unintentional 07/28/2018  . Prediabetes 01/26/2018  . Abnormal thyroid blood test 01/26/2018  . Asymptomatic microscopic hematuria 01/26/2018  . Female cystocele 01/26/2018  . HTN (hypertension) 03/21/2013  . Hyperlipidemia 03/21/2013    Past Surgical History:  Procedure Laterality Date  . ANTERIOR AND POSTERIOR REPAIR WITH SACROSPINOUS FIXATION N/A 01/20/2020   Procedure: ANTERIOR AND POSTERIOR REPAIR WITH SACROSPINOUS FIXATION;  Surgeon: [name], MD;  Location: [facility];  Service: Gynecology;  Laterality: N/A;  . COLONOSCOPY     x 3  . CYSTOSCOPY N/A 01/20/2020   Procedure: CYSTOSCOPY;  Surgeon: [name], MD;  Location: [facility];  Service: Gynecology;  Laterality: N/A;  . JOINT REPLACEMENT Right 05/2019  . LAPAROSCOPIC VAGINAL HYSTERECTOMY WITH SALPINGO OOPHORECTOMY Bilateral 01/20/2020   Procedure: LAPAROSCOPIC ASSISTED VAGINAL HYSTERECTOMY WITH SALPINGO OOPHORECTOMY;  Surgeon: [name], MD;  Location:  [place];  Service: Gynecology;  Laterality: Bilateral;  NEED BED  . POLYPECTOMY    . TONSILLECTOMY    . TOTAL KNEE ARTHROPLASTY Right 06/24/2019   Procedure: TOTAL KNEE ARTHROPLASTY;  Surgeon: Gaynelle Arabian, MD;  Location: [facility];  Service: Orthopedics;  Laterality: Right;  60min  . TUBAL LIGATION       OB History   No obstetric history on file.     Family History  Problem Relation Age of Onset  . Colon cancer Maternal Grandmother   . Cancer Maternal Grandmother        colon  . Prostate cancer Father   . Cancer Father        prostate  . Alzheimer's disease Mother   . Hyperlipidemia Mother   . Hypertension Mother   . Heart disease Paternal Grandmother   . Heart disease Paternal Grandfather   . Hypertension Sister   . Colon polyps Neg Hx   . Esophageal cancer Neg Hx   . Rectal cancer Neg Hx   . Stomach cancer Neg Hx     Social History   Tobacco Use  . Smoking status: Never Smoker  . Smokeless tobacco: Never Used  Substance Use Topics  . Alcohol use: Yes    Comment: rarely; 1-2x/mo  . Drug use: No    Home Medications Prior to Admission medications   Medication Sig Start Date End Date Taking? Authorizing Provider  acetaminophen (TYLENOL) 500 MG tablet Take 1,000 mg by mouth every 6 (six) hours as needed for moderate pain or headache.  Yes [provider]  hydrocortisone cream 1 % Apply 1 application topically daily as needed for itching.    Yes [provider]  loperamide (IMODIUM A-D) 2 MG tablet Take 2 mg by mouth 4 (four) times daily as needed for diarrhea or loose stools.    Yes [provider]  melatonin 5 MG TABS Take 5 mg by mouth at bedtime.    Yes [provider]  Pitavastatin Calcium (LIVALO) 2 MG TABS Take 0.5 tablets (1 mg total) by mouth daily. 11/08/19  Yes Forrest Moron, MD  simethicone (MYLICON) 80 MG chewable tablet Chew 80 mg by mouth every 6 (six) hours as needed for flatulence.   Yes  [provider]  spironolactone (ALDACTONE) 50 MG tablet TAKE 1 TABLET(50 MG) BY MOUTH DAILY Patient taking differently: Take 50 mg by mouth daily.  12/24/19  Yes Stallings, Zoe A, MD  triamcinolone cream (KENALOG) 0.1 % Apply 1 application topically daily as needed (eczema). 08/14/19  Yes Delia Chimes A, MD  gabapentin (NEURONTIN) 100 MG capsule Take 2 capsules (200 mg total) by mouth 3 (three) times daily. Take two 100 mg capsules three times a day for two weeks following surgery.Then take two 100 mg capsules two times a day for two weeks. Then take two 100 mg capsules once a day for two weeks.Then discontinue the Gabapentin. Patient not taking: Reported on 08/14/2019 06/25/19   Maurice March, PA-C  methocarbamol (ROBAXIN) 500 MG tablet Take 1 tablet (500 mg total) by mouth every 6 (six) hours as needed for muscle spasms. Patient not taking: Reported on 11/20/2019 06/25/19   Maurice March, PA-C  olmesartan (BENICAR) 40 MG tablet Take 1 tablet (40 mg total) by mouth daily. Patient not taking: Reported on 08/14/2019 01/25/19   Forrest Moron, MD  oxyCODONE (OXY IR/ROXICODONE) 5 MG immediate release tablet Take 1-2 tablets (5-10 mg total) by mouth every 6 (six) hours as needed for severe pain. Patient not taking: Reported on 11/27/2019 06/25/19   Maurice March, PA-C  traMADol (ULTRAM) 50 MG tablet Take 1-2 tablets (50-100 mg total) by mouth every 6 (six) hours as needed for moderate pain. Patient not taking: Reported on 11/27/2019 06/25/19   Maurice March, PA-C    Allergies    Hctz [hydrochlorothiazide], Epinephrine, Novocain [procaine], and Amlodipine  Review of Systems   Review of Systems  Constitutional: Negative for activity change.  Gastrointestinal: Negative for abdominal pain.  Genitourinary: Positive for vaginal bleeding.  Hematological: Does not bruise/bleed easily.    Physical Exam Updated Vital Signs BP 125/82 (BP Location: Right Arm)   Pulse 92   Temp  98.8 F (37.1 C) (Oral)   Resp 16   Ht 5' 1.5" (1.562 m)   Wt 59 kg   SpO2 100%   BMI 24.17 kg/m   Physical Exam Vitals and nursing note reviewed.  Constitutional:      Appearance: She is well-developed.  HENT:     Head: Normocephalic and atraumatic.  Cardiovascular:     Rate and Rhythm: Normal rate.  Pulmonary:     Effort: Pulmonary effort is normal.  Abdominal:     General: Bowel sounds are normal.  Musculoskeletal:     Cervical back: Normal range of motion and neck supple.  Skin:    General: Skin is warm and dry.  Neurological:     Mental Status: She is alert and oriented to person, place, and time.     ED Results / Procedures /  Treatments   Labs (all labs ordered are listed, but only abnormal results are displayed) Labs Reviewed  CBC WITH DIFFERENTIAL/PLATELET - Abnormal; Notable for the following components:      Result Value   RBC 2.87 (*)    Hemoglobin 8.9 (*)    HCT 27.1 (*)    All other components within normal limits  COMPREHENSIVE METABOLIC PANEL - Abnormal; Notable for the following components:   Glucose, Bld 108 (*)    All other components within normal limits    EKG None  Radiology No results found.  Procedures Procedures (including critical care time)  Medications Ordered in ED Medications - No data to display  ED Course  I have reviewed the triage vital signs and the nursing notes.  Pertinent labs & imaging results that were available during my care of the patient were reviewed by me and considered in my medical decision making (see chart for details).    MDM Rules/Calculators/A&P                      Pt comes in with post op bleeding. She is post op from vaginaly hysterectomy. Pt has no abd pain and she is not on blood thinner. Bleeding is getting better. Think that likely a hematoma self evacuated or stich broke. No need for further imaging. No concerns for infection or large volume bleed, cbc is reassuring. There is gyne f/u  tomorrow.  Final Clinical Impression(s) / ED Diagnoses Final diagnoses:  Vaginal bleeding  Status post vaginal hysterectomy    Rx / DC Orders ED Discharge Orders    None       Varney Biles, MD 02/03/20 1837

## 2020-02-04 DIAGNOSIS — Z09 Encounter for follow-up examination after completed treatment for conditions other than malignant neoplasm: Secondary | ICD-10-CM | POA: Diagnosis not present

## 2020-02-04 DIAGNOSIS — D509 Iron deficiency anemia, unspecified: Secondary | ICD-10-CM | POA: Diagnosis not present

## 2020-02-11 ENCOUNTER — Encounter: Payer: Self-pay | Admitting: Family Medicine

## 2020-02-11 DIAGNOSIS — E785 Hyperlipidemia, unspecified: Secondary | ICD-10-CM

## 2020-02-11 MED ORDER — LIVALO 2 MG PO TABS: 1.0000 mg | ORAL_TABLET | Freq: Every day | ORAL | 0 refills | Status: DC

## 2020-02-26 ENCOUNTER — Ambulatory Visit (INDEPENDENT_AMBULATORY_CARE_PROVIDER_SITE_OTHER): Payer: PPO | Admitting: Emergency Medicine

## 2020-02-26 VITALS — BP 114/72 | Ht 62.0 in | Wt 130.0 lb

## 2020-02-26 DIAGNOSIS — Z0000 Encounter for general adult medical examination without abnormal findings: Secondary | ICD-10-CM | POA: Diagnosis not present

## 2020-02-26 NOTE — Progress Notes (Signed)
Presents today for [employer] Visit   Date of last exam: 11-27-2019  Interpreter used for this visit? No  I connected with  [name] on 02/26/20 by a telephone and verified that I am speaking with the correct person using two identifiers.   I discussed the limitations of evaluation and management by telemedicine. The patient expressed understanding and agreed to proceed.    Patient Care Team: Rutherford Guys, MD as PCP - General (Family Medicine) [name], MD as Consulting Physician (Obstetrics and Gynecology) Pedro Earls, MD as Attending Physician (Family Medicine)   Other items to address today:  Discussed Eye/Dental Discussed Immunizations   Other Screening: Last screening for diabetes: 06/19/2019   ADVANCE DIRECTIVES: Discussed: yes On File: no Materials Provided:no  Immunization status:  Immunization History  Administered Date(s) Administered  . Influenza, High Dose Seasonal PF 06/18/2018  . Influenza,inj,Quad PF,6+ Mos 06/19/2013  . Influenza-Unspecified 06/15/2015, 06/15/2016, 07/04/2017  . Moderna SARS-COVID-2 Vaccination 10/11/2019, 11/10/2019  . Pneumococcal Conjugate-13 09/08/2015  . Pneumococcal Polysaccharide-23 11/02/2013  . Zoster 06/28/2016  . Zoster Recombinat (Shingrix) 06/02/2018     Health Maintenance Due  Topic Date Due  . TETANUS/TDAP  Never done     Functional Status Survey: Is the patient deaf or have difficulty hearing?: No Does the patient have difficulty seeing, even when wearing glasses/contacts?: No Does the patient have difficulty concentrating, remembering, or making decisions?: No Does the patient have difficulty walking or climbing stairs?: No Does the patient have difficulty dressing or bathing?: No Does the patient have difficulty doing errands alone such as visiting a doctor's office or shopping?: No   6CIT Screen 02/26/2020 02/25/2019 05/05/2017  What Year? 0 points 0 points 0 points    What month? 0 points 0 points 0 points  What time? 0 points 0 points 0 points  Count back from 20 0 points 0 points 0 points  Months in reverse 0 points 0 points 0 points  Repeat phrase 0 points 0 points 0 points  Total Score 0 0 0        Clinical Support from 02/26/2020 in [facility] at [place]  AUDIT-C Score 0       Home Environment:   Lives two story home No trouble climbing stairs No scattered rugs No grab bars/ has a shower seat Adequate lighting / no clutter    Patient Active Problem List   Diagnosis Date Noted  . Pelvic relaxation 01/20/2020  . S/P laparoscopic assisted vaginal hysterectomy (LAVH) 01/20/2020  . Presence of right artificial knee joint 08/16/2019  . OA (osteoarthritis) of knee 06/24/2019  . Weight loss, unintentional 07/28/2018  . Prediabetes 01/26/2018  . Abnormal thyroid blood test 01/26/2018  . Asymptomatic microscopic hematuria 01/26/2018  . Female cystocele 01/26/2018  . HTN (hypertension) 03/21/2013  . Hyperlipidemia 03/21/2013     Past Medical History:  Diagnosis Date  . Adenomatous polyps   . Arthritis   . Diverticulosis   . Ganglion of left wrist    thumb joint  . GERD (gastroesophageal reflux disease)   . Hyperlipidemia   . Hypertension   . Pre-diabetes   . Tinnitus      Past Surgical History:  Procedure Laterality Date  . ABDOMINAL HYSTERECTOMY N/A    Phreesia 02/23/2020  . ANTERIOR AND POSTERIOR REPAIR WITH SACROSPINOUS FIXATION N/A 01/20/2020   Procedure: ANTERIOR AND POSTERIOR REPAIR WITH SACROSPINOUS FIXATION;  Surgeon: [name], MD;  [facility]: [facility];  Service:  Gynecology;  Laterality: N/A;  . COLONOSCOPY     x 3  . CYSTOSCOPY N/A 01/20/2020   Procedure: CYSTOSCOPY;  Surgeon: [name], MD;  Location: [facility];  Service: Gynecology;  Laterality: N/A;  . JOINT REPLACEMENT Right 05/2019  . LAPAROSCOPIC VAGINAL HYSTERECTOMY WITH SALPINGO OOPHORECTOMY Bilateral 01/20/2020    Procedure: LAPAROSCOPIC ASSISTED VAGINAL HYSTERECTOMY WITH SALPINGO OOPHORECTOMY;  Surgeon: [name], MD;  Location: [place];  Service: Gynecology;  Laterality: Bilateral;  NEED BED  . POLYPECTOMY    . TONSILLECTOMY    . TOTAL KNEE ARTHROPLASTY Right 06/24/2019   Procedure: TOTAL KNEE ARTHROPLASTY;  Surgeon: Gaynelle Arabian, MD;  Location: [facility];  Service: Orthopedics;  Laterality: Right;  24min  . TUBAL LIGATION       Family History  Problem Relation Age of Onset  . Colon cancer Maternal Grandmother   . Cancer Maternal Grandmother        colon  . Prostate cancer Father   . Cancer Father        prostate  . Alzheimer's disease Mother   . Hyperlipidemia Mother   . Hypertension Mother   . Heart disease Paternal Grandmother   . Heart disease Paternal Grandfather   . Hypertension Sister   . Colon polyps Neg Hx   . Esophageal cancer Neg Hx   . Rectal cancer Neg Hx   . Stomach cancer Neg Hx      Social History   Socioeconomic History  . Marital status: Married    Spouse name: [name]  . Number of children: 4  . Years of education: Not on file  . Highest education level: Not on file  Occupational History  . 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 Smoking status: Never Smoker  . Smokeless tobacco: Never Used  Vaping Use  . Vaping Use: Never used  Substance and Sexual Activity  . Alcohol use: Yes    Comment: rarely; 1-2x/mo  . Drug use: No  . Sexual activity: Yes    Birth control/protection: Post-menopausal, Surgical  Other Topics Concern  . Not on file  Social History Narrative   Lives with her husband.   Adult children. Two live in [place].   Social Determinants of Health   Financial Resource Strain:   . Difficulty of Paying Living Expenses:   Food Insecurity:   . Worried About Charity fundraiser in the Last Year:   . Arboriculturist in the Last Year:   Transportation Needs:   . Lexicographer (Medical):   Marland Kitchen Lack of Transportation (Non-Medical):   Physical Activity:   . Days of Exercise per Week:   . Minutes of Exercise per Session:   Stress:   . Feeling of Stress :   Social Connections:   . Frequency of Communication with Friends and Family:   . Frequency of Social Gatherings with Friends and Family:   . Attends Religious Services:   . Active Member of Clubs or Organizations:   . Attends Archivist Meetings:   Marland Kitchen Marital Status:   Intimate Partner Violence:   . Fear of Current or Ex-Partner:   . Emotionally Abused:   Marland Kitchen Physically Abused:   . Sexually Abused:      Allergies  Allergen Reactions  . Hctz [Hydrochlorothiazide] Other (See Comments)    Causes extreme hyponatremia quickly - 126  . Epinephrine Other (See Comments)    Brief trembling sensation   .  Novocain [Procaine]     "makes me tremble"   . Amlodipine Swelling    Mild but chronic lower extremity edema  . Other      Prior to Admission medications   Medication Sig Start Date End Date Taking? Authorizing Provider  acetaminophen (TYLENOL) 500 MG tablet Take 1,000 mg by mouth every 6 (six) hours as needed for moderate pain or headache.   Yes [provider]  hydrocortisone cream 1 % Apply 1 application topically daily as needed for itching.    Yes [provider]  loperamide (IMODIUM A-D) 2 MG tablet Take 2 mg by mouth 4 (four) times daily as needed for diarrhea or loose stools.    Yes [provider]  melatonin 5 MG TABS Take 5 mg by mouth at bedtime.    Yes [provider]  Pitavastatin Calcium (LIVALO) 2 MG TABS Take 0.5 tablets (1 mg total) by mouth daily. 02/11/20  Yes Rutherford Guys, MD  simethicone (MYLICON) 80 MG chewable tablet Chew 80 mg by mouth every 6 (six) hours as needed for flatulence.   Yes [provider]  spironolactone (ALDACTONE) 50 MG tablet TAKE 1 TABLET(50 MG) BY MOUTH DAILY Patient taking differently: Take 50  mg by mouth daily.  12/24/19  Yes Stallings, Zoe A, MD  triamcinolone cream (KENALOG) 0.1 % Apply 1 application topically daily as needed (eczema). 08/14/19  Yes Delia Chimes A, MD  gabapentin (NEURONTIN) 100 MG capsule Take 2 capsules (200 mg total) by mouth 3 (three) times daily. Take two 100 mg capsules three times a day for two weeks following surgery.Then take two 100 mg capsules two times a day for two weeks. Then take two 100 mg capsules once a day for two weeks.Then discontinue the Gabapentin. Patient not taking: Reported on 08/14/2019 06/25/19   Maurice March, PA-C  methocarbamol (ROBAXIN) 500 MG tablet Take 1 tablet (500 mg total) by mouth every 6 (six) hours as needed for muscle spasms. Patient not taking: Reported on 11/20/2019 06/25/19   Maurice March, PA-C  olmesartan (BENICAR) 40 MG tablet Take 1 tablet (40 mg total) by mouth daily. Patient not taking: Reported on 08/14/2019 01/25/19   Forrest Moron, MD  oxyCODONE (OXY IR/ROXICODONE) 5 MG immediate release tablet Take 1-2 tablets (5-10 mg total) by mouth every 6 (six) hours as needed for severe pain. Patient not taking: Reported on 11/27/2019 06/25/19   Maurice March, PA-C  traMADol (ULTRAM) 50 MG tablet Take 1-2 tablets (50-100 mg total) by mouth every 6 (six) hours as needed for moderate pain. Patient not taking: Reported on 11/27/2019 06/25/19   Rudi Coco     Depression screen [facility] 2/9 02/26/2020 11/27/2019 08/14/2019 07/10/2019 06/11/2019  Decreased Interest 0 0 0 0 0  Down, Depressed, Hopeless 0 0 0 0 0  PHQ - 2 Score 0 0 0 0 0     Fall Risk  02/26/2020 11/27/2019 08/14/2019 07/10/2019 06/11/2019  Falls in the past year? 0 0 0 0 0  Number falls in past yr: 0 0 0 0 0  Injury with Fall? 0 0 0 0 0  Follow up Falls evaluation completed;Education provided Falls evaluation completed Falls evaluation completed Falls evaluation completed Falls evaluation completed      PHYSICAL EXAM: BP 114/72 Comment:  taken from a previous visit  Ht 5\' 2"  (1.575 m)   Wt 130 lb (59 kg)   BMI 23.78 kg/m    Wt Readings from Last 3  Encounters:  02/26/20 130 lb (59 kg)  01/27/20 130 lb (59 kg)  01/20/20 129 lb 12.8 oz (58.9 kg)       Education/Counseling provided regarding diet and exercise, prevention of chronic diseases, smoking/tobacco cessation, if applicable, and reviewed "Covered Medicare Preventive Services."

## 2020-02-26 NOTE — Patient Instructions (Addendum)
Thank you for taking time to come for your Medicare Wellness Visit. I appreciate your ongoing commitment to your health goals. Please review the following plan we discussed and let me know if I can assist you in the future.  Kenneisha Cochrane LPN  Preventive Care 71 Years and Older, Female Preventive care refers to lifestyle choices and visits with your health care provider that can promote health and wellness. This includes:  A yearly physical exam. This is also called an annual well check.  Regular dental and eye exams.  Immunizations.  Screening for certain conditions.  Healthy lifestyle choices, such as diet and exercise. What can I expect for my preventive care visit? Physical exam Your health care provider will check:  Height and weight. These may be used to calculate body mass index (BMI), which is a measurement that tells if you are at a healthy weight.  Heart rate and blood pressure.  Your skin for abnormal spots. Counseling Your health care provider may ask you questions about:  Alcohol, tobacco, and drug use.  Emotional well-being.  Home and relationship well-being.  Sexual activity.  Eating habits.  History of falls.  Memory and ability to understand (cognition).  Work and work environment.  Pregnancy and menstrual history. What immunizations do I need?  Influenza (flu) vaccine  This is recommended every year. Tetanus, diphtheria, and pertussis (Tdap) vaccine  You may need a Td booster every 10 years. Varicella (chickenpox) vaccine  You may need this vaccine if you have not already been vaccinated. Zoster (shingles) vaccine  You may need this after age 60. Pneumococcal conjugate (PCV13) vaccine  One dose is recommended after age 71. Pneumococcal polysaccharide (PPSV23) vaccine  One dose is recommended after age 71. Measles, mumps, and rubella (MMR) vaccine  You may need at least one dose of MMR if you were born in 1957 or later. You may also  need a second dose. Meningococcal conjugate (MenACWY) vaccine  You may need this if you have certain conditions. Hepatitis A vaccine  You may need this if you have certain conditions or if you travel or work in places where you may be exposed to hepatitis A. Hepatitis B vaccine  You may need this if you have certain conditions or if you travel or work in places where you may be exposed to hepatitis B. Haemophilus influenzae type b (Hib) vaccine  You may need this if you have certain conditions. You may receive vaccines as individual doses or as more than one vaccine together in one shot (combination vaccines). Talk with your health care provider about the risks and benefits of combination vaccines. What tests do I need? Blood tests  Lipid and cholesterol levels. These may be checked every 5 years, or more frequently depending on your overall health.  Hepatitis C test.  Hepatitis B test. Screening  Lung cancer screening. You may have this screening every year starting at age 55 if you have a 30-pack-year history of smoking and currently smoke or have quit within the past 15 years.  Colorectal cancer screening. All adults should have this screening starting at age 50 and continuing until age 75. Your health care provider may recommend screening at age 45 if you are at increased risk. You will have tests every 1-10 years, depending on your results and the type of screening test.  Diabetes screening. This is done by checking your blood sugar (glucose) after you have not eaten for a while (fasting). You may have this done every 1-3   years.  Mammogram. This may be done every 1-2 years. Talk with your health care provider about how often you should have regular mammograms.  BRCA-related cancer screening. This may be done if you have a family history of breast, ovarian, tubal, or peritoneal cancers. Other tests  Sexually transmitted disease (STD) testing.  Bone density scan. This is done  to screen for osteoporosis. You may have this done starting at age 94. Follow these instructions at home: Eating and drinking  Eat a diet that includes fresh fruits and vegetables, whole grains, lean protein, and low-fat dairy products. Limit your intake of foods with high amounts of sugar, saturated fats, and salt.  Take vitamin and mineral supplements as recommended by your health care provider.  Do not drink alcohol if your health care provider tells you not to drink.  If you drink alcohol: ? Limit how much you have to 0-1 drink a day. ? Be aware of how much alcohol is in your drink. In the [place] one drink equals one 12 oz bottle of beer (355 mL), one 5 oz glass of wine (148 mL), or one 1 oz glass of hard liquor (44 mL). Lifestyle  Take daily care of your teeth and gums.  Stay active. Exercise for at least 30 minutes on 5 or more days each week.  Do not use any products that contain nicotine or tobacco, such as cigarettes, e-cigarettes, and chewing tobacco. If you need help quitting, ask your health care provider.  If you are sexually active, practice safe sex. Use a condom or other form of protection in order to prevent STIs (sexually transmitted infections).  Talk with your health care provider about taking a low-dose aspirin or statin. What's next?  Go to your health care provider once a year for a well check visit.  Ask your health care provider how often you should have your eyes and teeth checked.  Stay up to date on all vaccines. This information is not intended to replace advice given to you by your health care provider. Make sure you discuss any questions you have with your health care provider. Document Revised: 08/09/2018 Document Reviewed: 08/09/2018 Elsevier Patient Education  2020 [employer].

## 2020-03-03 DIAGNOSIS — H2513 Age-related nuclear cataract, bilateral: Secondary | ICD-10-CM | POA: Diagnosis not present

## 2020-03-03 DIAGNOSIS — H5213 Myopia, bilateral: Secondary | ICD-10-CM | POA: Diagnosis not present

## 2020-03-16 ENCOUNTER — Telehealth: Payer: Self-pay | Admitting: Gastroenterology

## 2020-03-16 NOTE — Telephone Encounter (Signed)
Pt states that she ate some cashews and has some pain in her abdomen and then diarrhea. She thinks the nuts caused this and she took Imodium. States she had gas also and tried some gas-x. Her discomfort is better and she will call back if she continues to have problems with the diarrhea.

## 2020-03-17 DIAGNOSIS — N952 Postmenopausal atrophic vaginitis: Secondary | ICD-10-CM | POA: Diagnosis not present

## 2020-03-17 DIAGNOSIS — Z09 Encounter for follow-up examination after completed treatment for conditions other than malignant neoplasm: Secondary | ICD-10-CM | POA: Diagnosis not present

## 2020-03-17 DIAGNOSIS — D509 Iron deficiency anemia, unspecified: Secondary | ICD-10-CM | POA: Diagnosis not present

## 2020-03-30 ENCOUNTER — Encounter: Payer: Self-pay | Admitting: Family Medicine

## 2020-03-30 ENCOUNTER — Other Ambulatory Visit: Payer: Self-pay | Admitting: Emergency Medicine

## 2020-03-30 ENCOUNTER — Other Ambulatory Visit: Payer: Self-pay

## 2020-03-30 DIAGNOSIS — I10 Essential (primary) hypertension: Secondary | ICD-10-CM

## 2020-03-30 MED ORDER — SPIRONOLACTONE 50 MG PO TABS: 50.0000 mg | ORAL_TABLET | Freq: Every day | ORAL | 0 refills | Status: DC

## 2020-03-30 MED ORDER — COLESTIPOL HCL 1 G PO TABS: 1.0000 g | ORAL_TABLET | Freq: Two times a day (BID) | ORAL | 3 refills | Status: DC

## 2020-03-30 NOTE — Telephone Encounter (Signed)
Patient called states she is still having diarrhea and it seems to be just in the mornings. Can control it with a half of Imodium everyday. Also taking OTC probiotics. Please advise

## 2020-03-30 NOTE — Telephone Encounter (Signed)
I recommend that she take Metamucil every day. Trial colestipol 1 g BID for improvement in diarrhea. Continue to use Imodium PRN. Office visit - next available. Thank you.

## 2020-03-30 NOTE — Telephone Encounter (Signed)
Pt calling states that she is having problems with diarrhea every morning. She has tried to find possible food or medication triggers but has not been able to find anything. She has tried 1/2 Imodium and it will control the diarrhea so she is able to leave the house. She is also taking a store brand probiotic but it does not seen to be making a difference. Please advise.

## 2020-03-30 NOTE — Telephone Encounter (Signed)
Spoke with pt and she is aware, script sent to pharmacy. Pt scheduled to see Dr. Tarri Glenn 06/01/20@1 :50pm. Pt aware.

## 2020-04-24 ENCOUNTER — Encounter: Payer: Self-pay | Admitting: Family Medicine

## 2020-04-24 NOTE — Telephone Encounter (Signed)
Pt complaining of dry itchy and irritated skin around her eyes and on her eye lids will recommend OV but is there anything I can advise her to possibly help over the weekend?

## 2020-04-28 ENCOUNTER — Other Ambulatory Visit: Payer: Self-pay

## 2020-04-28 ENCOUNTER — Ambulatory Visit (INDEPENDENT_AMBULATORY_CARE_PROVIDER_SITE_OTHER): Payer: PPO | Admitting: Family Medicine

## 2020-04-28 ENCOUNTER — Encounter: Payer: Self-pay | Admitting: Family Medicine

## 2020-04-28 VITALS — BP 120/80 | HR 81 | Temp 97.6°F | Resp 15 | Ht 62.0 in | Wt 126.6 lb

## 2020-04-28 DIAGNOSIS — H1013 Acute atopic conjunctivitis, bilateral: Secondary | ICD-10-CM | POA: Diagnosis not present

## 2020-04-28 NOTE — Progress Notes (Signed)
8/31/20213:59 PM  Amanda Curry 06-02-1951, 69 y.o., female [id number]  Chief Complaint  Patient presents with  . Eye Problem    pt reports itchy watery and irritated eyes, 1 week ago started and has gotten worse with dry skin under the eyes and other spots on the face.     HPI:   Patient is a 69 y.o. female with past medical history significant for HTN, HLP who presents today for eye problems  A week ago started having bilateral itchy eyes She has been using OTC allergy eye drops She took loratidine today She has been washing face with baby shampoo and cold compresses No vision changes, no sign redness, no pain   Depression screen [facility] 2/9 04/28/2020 02/26/2020 11/27/2019  Decreased Interest 0 0 0  Down, Depressed, Hopeless 0 0 0  PHQ - 2 Score 0 0 0    Fall Risk  04/28/2020 02/26/2020 11/27/2019 08/14/2019 07/10/2019  Falls in the past year? 0 0 0 0 0  Number falls in past yr: 0 0 0 0 0  Injury with Fall? 0 0 0 0 0  Risk for fall due to : No Fall Risks - - - -  Follow up Falls evaluation completed Falls evaluation completed;Education provided Falls evaluation completed Falls evaluation completed Falls evaluation completed     Allergies  Allergen Reactions  . Hctz [Hydrochlorothiazide] Other (See Comments)    Causes extreme hyponatremia quickly - 126  . Epinephrine Other (See Comments)    Brief trembling sensation   . Novocain [Procaine]     "makes me tremble"   . Amlodipine Swelling    Mild but chronic lower extremity edema  . Other     Prior to Admission medications   Medication Sig Start Date End Date Taking? Authorizing Provider  acetaminophen (TYLENOL) 500 MG tablet Take 1,000 mg by mouth every 6 (six) hours as needed for moderate pain or headache.   Yes [provider]  colestipol (COLESTID) 1 g tablet Take 1 tablet (1 g total) by mouth 2 (two) times daily. 03/30/20  Yes Thornton Park, MD  hydrocortisone cream 1 % Apply 1 application topically daily  as needed for itching.    Yes [provider]  melatonin 5 MG TABS Take 5 mg by mouth at bedtime.    Yes [provider]  Pitavastatin Calcium (LIVALO) 2 MG TABS Take 0.5 tablets (1 mg total) by mouth daily. 02/11/20  Yes Rutherford Guys, MD  simethicone (MYLICON) 80 MG chewable tablet Chew 80 mg by mouth every 6 (six) hours as needed for flatulence.   Yes [provider]  spironolactone (ALDACTONE) 50 MG tablet Take 1 tablet (50 mg total) by mouth daily. 03/30/20  Yes Rutherford Guys, MD  triamcinolone cream (KENALOG) 0.1 % Apply 1 application topically daily as needed (eczema). 08/14/19  Yes Forrest Moron, MD    Past Medical History:  Diagnosis Date  . Adenomatous polyps   . Arthritis   . Diverticulosis   . Ganglion of left wrist    thumb joint  . GERD (gastroesophageal reflux disease)   . Hyperlipidemia   . Hypertension   . Pre-diabetes   . Tinnitus     Past Surgical History:  Procedure Laterality Date  . ABDOMINAL HYSTERECTOMY N/A    Phreesia 02/23/2020  . ANTERIOR AND POSTERIOR REPAIR WITH SACROSPINOUS FIXATION N/A 01/20/2020   Procedure: ANTERIOR AND POSTERIOR REPAIR WITH SACROSPINOUS FIXATION;  Surgeon: [name], MD;  Location: [facility];  Service: Gynecology;  Laterality: N/A;  . COLONOSCOPY     x 3  . CYSTOSCOPY N/A 01/20/2020   Procedure: CYSTOSCOPY;  Surgeon: [name], MD;  Location: [facility];  Service: Gynecology;  Laterality: N/A;  . JOINT REPLACEMENT Right 05/2019  . LAPAROSCOPIC VAGINAL HYSTERECTOMY WITH SALPINGO OOPHORECTOMY Bilateral 01/20/2020   Procedure: LAPAROSCOPIC ASSISTED VAGINAL HYSTERECTOMY WITH SALPINGO OOPHORECTOMY;  Surgeon: [name], MD;  Location: [place];  Service: Gynecology;  Laterality: Bilateral;  NEED BED  . POLYPECTOMY    . TONSILLECTOMY    . TOTAL KNEE ARTHROPLASTY Right 06/24/2019   Procedure: TOTAL KNEE ARTHROPLASTY;  Surgeon: Gaynelle Arabian, MD;   Location: [facility];  Service: Orthopedics;  Laterality: Right;  30min  . TUBAL LIGATION      Social History   Tobacco Use  . Smoking status: Never Smoker  . Smokeless tobacco: Never Used  Substance Use Topics  . Alcohol use: Yes    Comment: rarely; 1-2x/mo    Family History  Problem Relation Age of Onset  . Colon cancer Maternal Grandmother   . Cancer Maternal Grandmother        colon  . Prostate cancer Father   . Cancer Father        prostate  . Alzheimer's disease Mother   . Hyperlipidemia Mother   . Hypertension Mother   . Heart disease Paternal Grandmother   . Heart disease Paternal Grandfather   . Hypertension Sister   . Colon polyps Neg Hx   . Esophageal cancer Neg Hx   . Rectal cancer Neg Hx   . Stomach cancer Neg Hx     ROS Per hpi  OBJECTIVE:  Today's Vitals   04/28/20 1542  BP: 120/80  Pulse: 81  Resp: 15  Temp: 97.6 F (36.4 C)  TempSrc: Temporal  SpO2: 98%  Weight: 126 lb 9.6 oz (57.4 kg)  Height: 5\' 2"  (1.575 m)   Body mass index is 23.16 kg/m.   Physical Exam Vitals and nursing note reviewed.  Constitutional:      Appearance: She is well-developed.  HENT:     Head: Normocephalic and atraumatic.  Eyes:     General: Allergic shiner present. No scleral icterus.    Extraocular Movements: Extraocular movements intact.     Conjunctiva/sclera: Conjunctivae normal.     Pupils: Pupils are equal, round, and reactive to light.  Pulmonary:     Effort: Pulmonary effort is normal.  Musculoskeletal:     Cervical back: Neck supple.  Skin:    General: Skin is warm and dry.  Neurological:     Mental Status: She is alert and oriented to person, place, and time.     No results found for this or any previous visit (from the past 24 hour(s)).  No results found.   ASSESSMENT and PLAN  1. Allergic conjunctivitis of both eyes Reassured patient. Overall controlled with OTC meds.  Return in about 6 months (around 10/26/2020) for  HTN/HLLP/prediabetes.    Rutherford Guys, MD Primary Care at [place],  [id number] Ph.  [phone] Fax [phone]

## 2020-04-28 NOTE — Patient Instructions (Signed)
° ° ° °  If you have lab work done today you will be contacted with your lab results within the next 2 weeks.  If you have not heard from us then please contact us. The fastest way to get your results is to register for My Chart. ° ° °IF you received an x-ray today, you will receive an invoice from [facility]. Please contact [facility] at [phone] with questions or concerns regarding your invoice.  ° °IF you received labwork today, you will receive an invoice from [facility]. Please contact [facility] at [phone] with questions or concerns regarding your invoice.  ° °Our billing staff will not be able to assist you with questions regarding bills from these companies. ° °You will be contacted with the lab results as soon as they are available. The fastest way to get your results is to activate your My Chart account. Instructions are located on the last page of this paperwork. If you have not heard from us regarding the results in 2 weeks, please contact this office. °  ° ° ° °

## 2020-05-09 ENCOUNTER — Other Ambulatory Visit: Payer: Self-pay | Admitting: Family Medicine

## 2020-05-09 DIAGNOSIS — E785 Hyperlipidemia, unspecified: Secondary | ICD-10-CM

## 2020-05-09 NOTE — Telephone Encounter (Signed)
Requested Prescriptions  Pending Prescriptions Disp Refills  . LIVALO 2 MG TABS [Pharmacy Med Name: LIVALO 2MG  TABLETS] 45 tablet 0    Sig: TAKE 1/2 TABLET(1 MG) BY MOUTH DAILY     Cardiovascular:  Antilipid - Statins Failed - 05/09/2020 11:25 AM      Failed - Total Cholesterol in normal range and within 360 days    Cholesterol, Total  Date Value Ref Range Status  07/23/2018 196 100 - 199 mg/dL Final         Failed - LDL in normal range and within 360 days    LDL Calculated  Date Value Ref Range Status  07/23/2018 105 (H) 0 - 99 mg/dL Final         Failed - HDL in normal range and within 360 days    HDL  Date Value Ref Range Status  07/23/2018 61 >39 mg/dL Final         Failed - Triglycerides in normal range and within 360 days    Triglycerides  Date Value Ref Range Status  07/23/2018 149 0 - 149 mg/dL Final         Passed - Patient is not pregnant      Passed - Valid encounter within last 12 months    Recent Outpatient Visits          1 week ago Allergic conjunctivitis of both eyes   [facility] at [facility], Lilia Argue, MD   2 months ago Medicare annual wellness visit, subsequent   Primary Care at [facility], Ines Bloomer, MD   5 months ago Essential hypertension   Primary Care at [facility], Arlie Solomons, MD   8 months ago Hyponatremia   Primary Care at [place], MD   8 months ago Essential hypertension   Primary Care at [facility], Arlie Solomons, MD

## 2020-05-11 DIAGNOSIS — H01119 Allergic dermatitis of unspecified eye, unspecified eyelid: Secondary | ICD-10-CM | POA: Diagnosis not present

## 2020-06-01 ENCOUNTER — Ambulatory Visit (INDEPENDENT_AMBULATORY_CARE_PROVIDER_SITE_OTHER): Payer: PPO | Admitting: Gastroenterology

## 2020-06-01 ENCOUNTER — Encounter: Payer: Self-pay | Admitting: Gastroenterology

## 2020-06-01 VITALS — BP 122/80 | HR 92 | Ht 61.25 in | Wt 129.5 lb

## 2020-06-01 DIAGNOSIS — R197 Diarrhea, unspecified: Secondary | ICD-10-CM

## 2020-06-01 MED ORDER — COLESTIPOL HCL 1 G PO TABS: 1.0000 g | ORAL_TABLET | Freq: Two times a day (BID) | ORAL | 3 refills | Status: DC

## 2020-06-01 NOTE — Progress Notes (Signed)
Referring Provider: Rutherford Guys, MD Primary Care Physician:  Rutherford Guys, MD   Chief complaint:  diarrhea   IMPRESSION:  Diarrhea, improved on Metamucil and colestipol    - random colon biopsies negative for microscopic colitis    - ESR, CRP, fecal calprotectin, stool cultures normal Elevated TSH 2019, normal 2020 History of colon polyps    - polypoid mucosa on colonoscopy 2009    - 80m descending colon tubular adenoma on colonoscopy 2016    - no polyps on colonoscopy 10/2019    - consider colonoscopy 2032 if clinically appropriate at that time Descending colon diverticulosis Family history of colon cancer (maternal grandmother)  Fecal calprotectin, ESR, and CRP were normal. Giardia testing was negative. Colonoscopy with random biopsies was normal.  Trial of stool bulking agent and bile acid binding agent has provided symptomatic relief. Suspected functional diarrhea.  Thyroid dysfunction may be contributing, although TSH had normalized when last checked.   PLAN: - No medication changes at this time - Continue Metamucil BID and colestipol 1 g BID (90 day with 3 refills) - Annual follow-up, earlier if needed  I spent 30 minutes, including in depth chart review, independent review of results, communicating results with the patient directly, face-to-face time with the patient, coordinating care, and ordering studies and medications as appropriate, and documentation.  HPI: Amanda ATHENSis a 69y.o. female who returns in follow-up for her diarrhea.  She was previously followed by Dr. BMaurene Capesfor a diagnosis of IBS. She had a colonoscopy in 2009 that showed colon polyps but the pathology reports described polypoid mucosa. Patient was seen in July 2014 for acute diarrheal illness which responded to Flagyl. Records from 2016 note occasional gas and bloating and concerns for a recurrent infection similar to 2014. Colonoscopy with Dr. BMaurene Capes2/23/2016 showed moderate diverticulosis in  the descending colon and a 491mdescending colon tubular adenoma. There is a family history of colon cancer in her grandmother.  Seen in consultation 06/11/18 for the acute onset of watery, explosive diarrhea. This initially occurred over a couple of days. Symptoms somewhat improved, but she continued to have recurrent symptoms weekly. She has had accidents with significant urgency, borboygmous and fatigue. No systemic complaints  Using Immodium AD weekly. She took three weeks of Florastor with improvement in her symptoms while using the maximum dose. When she titrated the dose, she did not have as much symptomatic relief.   In 05/2018 she had a normal CRP, ESR, giardia, fecal calprotectin, and stool culture.  TSH was elevated as high as 9 in May 2019. But normal January 2020.  Colonoscopy ultimately performed 11/20/2019 and showed pancolonic diverticulosis that was most severe in the left colon and internal hemorrhoids.  Right-sided and left-sided random colon biopsies were normal.  Called in August with ongoing symptoms, particularly morning diarrhea. No medication or food triggers identified. Immodium PRN provided incomplete relief. Probiotics did not seem to make a difference. Started colestid in early August 2021.   Colestid and metamucil BID is providing, particularly over the last 2 months.  She is having a bowel movement once to twice daily. No loose stools or accidents. Rare hard stools. No blood or mucous. No straining. Sense of complete evacuation with every bowel movement. No rectal pain or irritation. Weight is overall stable after some weight loss around a surgery. Appetite is good.    Past Medical History:  Diagnosis Date  . Adenomatous polyps   . Arthritis   . Diverticulosis   .  Ganglion of left wrist    thumb joint  . GERD (gastroesophageal reflux disease)   . Hyperlipidemia   . Hypertension   . Pre-diabetes   . Tinnitus     Past Surgical History:  Procedure Laterality Date   . ABDOMINAL HYSTERECTOMY N/A    Phreesia 02/23/2020  . ANTERIOR AND POSTERIOR REPAIR WITH SACROSPINOUS FIXATION N/A 01/20/2020   Procedure: ANTERIOR AND POSTERIOR REPAIR WITH SACROSPINOUS FIXATION;  Surgeon: [name], MD;  Location: [place];  Service: Gynecology;  Laterality: N/A;  . COLONOSCOPY     x 3  . CYSTOSCOPY N/A 01/20/2020   Procedure: CYSTOSCOPY;  Surgeon: [name], MD;  Location: [facility];  Service: Gynecology;  Laterality: N/A;  . JOINT REPLACEMENT Right 05/2019  . LAPAROSCOPIC VAGINAL HYSTERECTOMY WITH SALPINGO OOPHORECTOMY Bilateral 01/20/2020   Procedure: LAPAROSCOPIC ASSISTED VAGINAL HYSTERECTOMY WITH SALPINGO OOPHORECTOMY;  Surgeon: [name], MD;  Location: [place];  Service: Gynecology;  Laterality: Bilateral;  NEED BED  . POLYPECTOMY    . TONSILLECTOMY    . TOTAL KNEE ARTHROPLASTY Right 06/24/2019   Procedure: TOTAL KNEE ARTHROPLASTY;  Surgeon: Gaynelle Arabian, MD;  Location: [facility];  Service: Orthopedics;  Laterality: Right;  21mn  . TUBAL LIGATION      Prior to Admission medications   Medication Sig Start Date End Date Taking? Authorizing Provider  Calcium Carbonate (CALCIUM 600 PO) Take by mouth 2 (two) times daily.    [provider]  glucosamine-chondroitin 500-400 MG tablet Take 1 tablet by mouth 3 (three) times daily.    [provider]  loperamide (IMODIUM A-D) 2 MG tablet Take 2 mg by mouth 4 (four) times daily as needed for diarrhea or loose stools.    [provider]  Melatonin 3 MG TABS Take 1 tablet by mouth at bedtime as needed (sleep).    [provider]  Multiple Vitamin (MULTIVITAMIN WITH MINERALS) TABS Take 1 tablet by mouth daily.    [provider]  olmesartan (BENICAR) 40 MG tablet TAKE 1 TABLET BY MOUTH EVERY DAY 05/28/18   SShawnee Knapp MD  Pitavastatin Calcium (LIVALO) 2 MG TABS Take 0.5 tablets (1 mg total) by mouth daily. 01/26/18   SShawnee Knapp MD  spironolactone (ALDACTONE) 50 MG tablet TAKE 1 TABLET BY MOUTH EVERY DAY 05/28/18   SShawnee Knapp MD    Current Outpatient Medications  Medication Sig Dispense Refill  . acetaminophen (TYLENOL) 500 MG tablet Take 1,000 mg by mouth every 6 (six) hours as needed for moderate pain or headache.    . colestipol (COLESTID) 1 g tablet Take 1 tablet (1 g total) by mouth 2 (two) times daily. 60 tablet 3  . hydrocortisone cream 1 % Apply 1 application topically daily as needed for itching.     .Marland KitchenLIVALO 2 MG TABS TAKE 1/2 TABLET(1 MG) BY MOUTH DAILY 45 tablet 0  . melatonin 5 MG TABS Take 5 mg by mouth at bedtime.     . simethicone (MYLICON) 80 MG chewable tablet Chew 80 mg by mouth every 6 (six) hours as needed for flatulence.    .Marland Kitchenspironolactone (ALDACTONE) 50 MG tablet Take 1 tablet (50 mg total) by mouth daily. 90 tablet 0  . triamcinolone cream (KENALOG) 0.1 % Apply 1 application topically daily as needed (eczema). 30 g 1   No current facility-administered medications for this visit.    Allergies as of 06/01/2020 - Review Complete 06/01/2020  Allergen Reaction Noted  .  Hctz [hydrochlorothiazide] Other (See Comments) 10/22/2015  . Epinephrine Other (See Comments) 11/06/2019  . Novocain [procaine]  06/24/2019  . Amlodipine Swelling 01/26/2018  . Other  02/23/2020    Family History  Problem Relation Age of Onset  . Colon cancer Maternal Grandmother   . Cancer Maternal Grandmother        colon  . Prostate cancer Father   . Cancer Father        prostate  . Alzheimer's disease Mother   . Hyperlipidemia Mother   . Hypertension Mother   . Heart disease Paternal Grandmother   . Heart disease Paternal Grandfather   . Hypertension Sister   . Colon polyps Neg Hx   . Esophageal cancer Neg Hx   . Rectal cancer Neg Hx   . Stomach cancer Neg Hx     Social History   Socioeconomic History  . Marital status: Married    Spouse name: [name]  . Number of children: 4  . Years of  education: Not on file  . Highest education level: Not on file  Occupational History  . 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 Smoking status: Never Smoker  . Smokeless tobacco: Never Used  Vaping Use  . Vaping Use: Never used  Substance and Sexual Activity  . Alcohol use: Yes    Comment: rarely; 1-2x/mo  . Drug use: No  . Sexual activity: Yes    Birth control/protection: Post-menopausal, Surgical  Other Topics Concern  . Not on file  Social History Narrative   Lives with her husband.   Adult children. Two live in [place].   Social Determinants of Health   Financial Resource Strain:   . Difficulty of Paying Living Expenses: Not on file  Food Insecurity:   . Worried About Charity fundraiser in the Last Year: Not on file  . Ran Out of Food in the Last Year: Not on file  Transportation Needs:   . Lack of Transportation (Medical): Not on file  . Lack of Transportation (Non-Medical): Not on file  Physical Activity:   . Days of Exercise per Week: Not on file  . Minutes of Exercise per Session: Not on file  Stress:   . Feeling of Stress : Not on file  Social Connections:   . Frequency of Communication with Friends and Family: Not on file  . Frequency of Social Gatherings with Friends and Family: Not on file  . Attends Religious Services: Not on file  . Active Member of Clubs or Organizations: Not on file  . Attends Archivist Meetings: Not on file  . Marital Status: Not on file  Intimate Partner Violence:   . Fear of Current or Ex-Partner: Not on file  . Emotionally Abused: Not on file  . Physically Abused: Not on file  . Sexually Abused: Not on file     General:   Alert,  Well-developed, well-nourished, pleasant and cooperative in NAD Head:  Normocephalic and atraumatic. Sclera anicteric.  Mouth:  No deformity or lesions.   Neck:  Supple; no masses or thyromegaly. Abdomen:  Soft, nontender, BS active,nonpalp mass or  hsm.   Extremities:  No edema. Neurologic:  Alert and  oriented x4;  grossly normal neurologically. Skin:  Intact without significant lesions or rashes.. Psych:  Alert and cooperative. Normal mood and affect.  Thornton Park, MD, MPH [facility]

## 2020-06-01 NOTE — Patient Instructions (Addendum)
If you are age 71 or older, your body mass index should be between 23-30. Your Body mass index is 24.27 kg/m. If this is out of the aforementioned range listed, please consider follow up with your Primary Care Provider.  If you are age 48 or younger, your body mass index should be between 19-25. Your Body mass index is 24.27 kg/m. If this is out of the aformentioned range listed, please consider follow up with your Primary Care Provider.     I am so glad to hear that you are feeling better.   I recommend that you continue Metamucil twice daily and the colestipol 1g twice daily. We have sent a one year refill of colestipol to your pharmacy to pick up at your convenience.   I'd like to see you in the office at least annually. However, please call or MyChart me with any questions or concerns prior to that time.   Thank you for entrusting me with your care and for choosing [facility], Dr. Thornton Park

## 2020-06-11 ENCOUNTER — Other Ambulatory Visit: Payer: Self-pay

## 2020-06-11 ENCOUNTER — Ambulatory Visit (INDEPENDENT_AMBULATORY_CARE_PROVIDER_SITE_OTHER): Payer: PPO

## 2020-06-11 ENCOUNTER — Encounter: Payer: Self-pay | Admitting: Podiatry

## 2020-06-11 ENCOUNTER — Ambulatory Visit: Payer: PPO | Admitting: Podiatry

## 2020-06-11 DIAGNOSIS — M778 Other enthesopathies, not elsewhere classified: Secondary | ICD-10-CM

## 2020-06-11 DIAGNOSIS — M19072 Primary osteoarthritis, left ankle and foot: Secondary | ICD-10-CM

## 2020-06-11 MED ORDER — MELOXICAM 15 MG PO TABS
15.0000 mg | ORAL_TABLET | Freq: Every day | ORAL | 3 refills | Status: DC
Start: 2020-06-11 — End: 2021-01-22

## 2020-06-11 MED ORDER — METHYLPREDNISOLONE 4 MG PO TBPK: ORAL_TABLET | ORAL | 0 refills | Status: DC

## 2020-06-11 NOTE — Progress Notes (Signed)
Subjective:  Patient ID: Amanda Curry, female    DOB: [birth year],  MRN: [id number] HPI Chief Complaint  Patient presents with  . Foot Pain    Dorsal midfoot left - aching, swelling x 2-3 days, no injury, sometimes red and shoes irritate it, Ibuprofen PRN  . New Patient (Initial Visit)    Est pt 14    69 y.o. female presents with the above complaint.   ROS: Denies fever chills nausea vomiting muscle aches pains calf pain back pain chest pain shortness of breath.  Past Medical History:  Diagnosis Date  . Adenomatous polyps   . Arthritis   . Diverticulosis   . Ganglion of left wrist    thumb joint  . GERD (gastroesophageal reflux disease)   . Hyperlipidemia   . Hypertension   . Pre-diabetes   . Tinnitus    Past Surgical History:  Procedure Laterality Date  . ABDOMINAL HYSTERECTOMY N/A    Phreesia 02/23/2020  . ANTERIOR AND POSTERIOR REPAIR WITH SACROSPINOUS FIXATION N/A 01/20/2020   Procedure: ANTERIOR AND POSTERIOR REPAIR WITH SACROSPINOUS FIXATION;  Surgeon: [name], MD;  Location: [place];  Service: Gynecology;  Laterality: N/A;  . COLONOSCOPY     x 3  . CYSTOSCOPY N/A 01/20/2020   Procedure: CYSTOSCOPY;  Surgeon: [name], MD;  Location: [facility];  Service: Gynecology;  Laterality: N/A;  . JOINT REPLACEMENT Right 05/2019  . LAPAROSCOPIC VAGINAL HYSTERECTOMY WITH SALPINGO OOPHORECTOMY Bilateral 01/20/2020   Procedure: LAPAROSCOPIC ASSISTED VAGINAL HYSTERECTOMY WITH SALPINGO OOPHORECTOMY;  Surgeon: [name], MD;  Location: [place];  Service: Gynecology;  Laterality: Bilateral;  NEED BED  . POLYPECTOMY    . TONSILLECTOMY    . TOTAL KNEE ARTHROPLASTY Right 06/24/2019   Procedure: TOTAL KNEE ARTHROPLASTY;  Surgeon: Gaynelle Arabian, MD;  Location: [facility];  Service: Orthopedics;  Laterality: Right;  28min  . TUBAL LIGATION      Current Outpatient Medications:  .  acetaminophen (TYLENOL) 500 MG tablet, Take  1,000 mg by mouth every 6 (six) hours as needed for moderate pain or headache., Disp: , Rfl:  .  colestipol (COLESTID) 1 g tablet, Take 1 tablet (1 g total) by mouth 2 (two) times daily., Disp: 180 tablet, Rfl: 3 .  hydrocortisone cream 1 %, Apply 1 application topically daily as needed for itching. , Disp: , Rfl:  .  LIVALO 2 MG TABS, TAKE 1/2 TABLET(1 MG) BY MOUTH DAILY, Disp: 45 tablet, Rfl: 0 .  melatonin 5 MG TABS, Take 5 mg by mouth at bedtime. , Disp: , Rfl:  .  meloxicam (MOBIC) 15 MG tablet, Take 1 tablet (15 mg total) by mouth daily., Disp: 30 tablet, Rfl: 3 .  methylPREDNISolone (MEDROL DOSEPAK) 4 MG TBPK tablet, 6 day dose pack - take as directed, Disp: 21 tablet, Rfl: 0 .  simethicone (MYLICON) 80 MG chewable tablet, Chew 80 mg by mouth every 6 (six) hours as needed for flatulence., Disp: , Rfl:  .  spironolactone (ALDACTONE) 50 MG tablet, Take 1 tablet (50 mg total) by mouth daily., Disp: 90 tablet, Rfl: 0 .  traMADol-acetaminophen (ULTRACET) 37.5-325 MG tablet, tramadol 37.5 mg-acetaminophen 325 mg tablet  TAKE 1 TABLET BY MOUTH EVERY 6 HOURS FOR UP TO 5 DAYS AS NEEDED, Disp: , Rfl:  .  triamcinolone cream (KENALOG) 0.1 %, Apply 1 application topically daily as needed (eczema)., Disp: 30 g, Rfl: 1  Allergies  Allergen Reactions  . Hctz [Hydrochlorothiazide] Other (See Comments)  Causes extreme hyponatremia quickly - 126  . Epinephrine Other (See Comments)    Brief trembling sensation   . Novocain [Procaine]     "makes me tremble"   . Amlodipine Swelling    Mild but chronic lower extremity edema  . Other    Review of Systems Objective:  There were no vitals filed for this visit.  General: Well developed, nourished, in no acute distress, alert and oriented x3   Dermatological: Skin is warm, dry and supple bilateral. Nails x 10 are well maintained; remaining integument appears unremarkable at this time. There are no open sores, no preulcerative lesions, no rash or signs  of infection present.  Vascular: Dorsalis Pedis artery and Posterior Tibial artery pedal pulses are 2/4 bilateral with immedate capillary fill time. Pedal hair growth present. No varicosities and no lower extremity edema present bilateral.   Neruologic: Grossly intact via light touch bilateral. Vibratory intact via tuning fork bilateral. Protective threshold with Semmes Wienstein monofilament intact to all pedal sites bilateral. Patellar and Achilles deep tendon reflexes 2+ bilateral. No Babinski or clonus noted bilateral.   Musculoskeletal: No gross boney pedal deformities bilateral. No pain, crepitus, or limitation noted with foot and ankle range of motion bilateral. Muscular strength 5/5 in all groups tested bilateral.  Painful dorsal nodularity left foot tarsometatarsal joints.  Is warm to the touch no purulence no malodor no open lesions or wounds.  Gait: Unassisted, Nonantalgic.    Radiographs:  Radiographs taken today demonstrate osteoarthritic changes of the tarsometatarsal joints of the left foot.  Joint space narrowing subchondral sclerosis dorsal eburnation.  Assessment & Plan:   Assessment: Osteoarthritis most likely tarsometatarsal joints dorsal aspect left foot  Plan: Start her on methylprednisolone to be followed by meloxicam.  I also injected the dorsal aspect of her foot 20 mg Kenalog 5 mg Marcaine point maximal tenderness.  She will follow up with me on an as-needed basis.     Tadd Holtmeyer T. [place], [place]

## 2020-06-19 DIAGNOSIS — Z96651 Presence of right artificial knee joint: Secondary | ICD-10-CM | POA: Diagnosis not present

## 2020-07-07 ENCOUNTER — Ambulatory Visit: Payer: PPO

## 2020-07-14 ENCOUNTER — Ambulatory Visit (INDEPENDENT_AMBULATORY_CARE_PROVIDER_SITE_OTHER): Payer: PPO | Admitting: Family Medicine

## 2020-07-14 ENCOUNTER — Other Ambulatory Visit: Payer: Self-pay

## 2020-07-14 DIAGNOSIS — D62 Acute posthemorrhagic anemia: Secondary | ICD-10-CM | POA: Diagnosis not present

## 2020-07-14 DIAGNOSIS — I10 Essential (primary) hypertension: Secondary | ICD-10-CM | POA: Diagnosis not present

## 2020-07-14 DIAGNOSIS — E785 Hyperlipidemia, unspecified: Secondary | ICD-10-CM | POA: Diagnosis not present

## 2020-07-15 ENCOUNTER — Ambulatory Visit: Payer: Self-pay | Admitting: *Deleted

## 2020-07-15 ENCOUNTER — Telehealth: Payer: Self-pay | Admitting: Family Medicine

## 2020-07-15 LAB — CBC WITH DIFFERENTIAL/PLATELET
Basophils Absolute: 0 10*3/uL (ref 0.0–0.2)
Basos: 1 %
EOS (ABSOLUTE): 0.1 10*3/uL (ref 0.0–0.4)
Eos: 2 %
Hematocrit: 37.4 % (ref 34.0–46.6)
Hemoglobin: 12.5 g/dL (ref 11.1–15.9)
Immature Grans (Abs): 0 10*3/uL (ref 0.0–0.1)
Immature Granulocytes: 0 %
Lymphocytes Absolute: 1.3 10*3/uL (ref 0.7–3.1)
Lymphs: 25 %
MCH: 30.1 pg (ref 26.6–33.0)
MCHC: 33.4 g/dL (ref 31.5–35.7)
MCV: 90 fL (ref 79–97)
Monocytes Absolute: 0.5 10*3/uL (ref 0.1–0.9)
Monocytes: 10 %
Neutrophils Absolute: 3.1 10*3/uL (ref 1.4–7.0)
Neutrophils: 62 %
Platelets: 377 10*3/uL (ref 150–450)
RBC: 4.15 x10E6/uL (ref 3.77–5.28)
RDW: 13.4 % (ref 11.7–15.4)
WBC: 5 10*3/uL (ref 3.4–10.8)

## 2020-07-15 LAB — CMP14+EGFR
ALT: 16 IU/L (ref 0–32)
AST: 24 IU/L (ref 0–40)
Albumin/Globulin Ratio: 2.3 — ABNORMAL HIGH (ref 1.2–2.2)
Albumin: 4.6 g/dL (ref 3.7–4.7)
Alkaline Phosphatase: 78 IU/L (ref 44–121)
BUN/Creatinine Ratio: 20 (ref 12–28)
BUN: 18 mg/dL (ref 8–27)
Bilirubin Total: 0.3 mg/dL (ref 0.0–1.2)
CO2: 24 mmol/L (ref 20–29)
Calcium: 9.6 mg/dL (ref 8.7–10.3)
Chloride: 99 mmol/L (ref 96–106)
Creatinine, Ser: 0.91 mg/dL (ref 0.57–1.00)
GFR calc Af Amer: 73 mL/min/{1.73_m2} (ref >59)
GFR calc non Af Amer: 64 mL/min/{1.73_m2} (ref >59)
Globulin, Total: 2 g/dL (ref 1.5–4.5)
Glucose: 99 mg/dL (ref 65–99)
Potassium: 5 mmol/L (ref 3.5–5.2)
Sodium: 136 mmol/L (ref 134–144)
Total Protein: 6.6 g/dL (ref 6.0–8.5)

## 2020-07-15 LAB — LIPID PANEL
Chol/HDL Ratio: 3.2 ratio (ref 0.0–4.4)
Cholesterol, Total: 253 mg/dL — ABNORMAL HIGH (ref 100–199)
HDL: 78 mg/dL (ref >39)
LDL Chol Calc (NIH): 152 mg/dL — ABNORMAL HIGH (ref 0–99)
Triglycerides: 131 mg/dL (ref 0–149)
VLDL Cholesterol Cal: 23 mg/dL (ref 5–40)

## 2020-07-15 LAB — TSH: TSH: 4.31 u[IU]/mL (ref 0.450–4.500)

## 2020-07-15 NOTE — Telephone Encounter (Signed)
Pt called Amanda Curry this morning and none of their nurses called her back -"Pt was here yesterday and had labs drawn has tender raised and bruise where she was drawn . Poss (hematoma) Patient is concerned and is there anything she should be doing"  Patient reports right inner arm where she had blood drawn yesterday now noted with swelling and firm area discolored dark purplish with some redness at needle site. Patient applied ice pack and area now decreased swelling. Encouraged patient to continue cool compress as needed a couple of times a day as needed until tomorrow. Care advise given. Patient verbalized understanding of care advise and to call back if symptoms worsen.   Reason for Disposition  [1] Small area of LOCALIZED swelling AND [2] itchy    Not itchy  Answer Assessment - Initial Assessment Questions 1. ONSET: "When did the swelling start?" (e.g., minutes, hours, days)     After lab draw yesterday  2. LOCATION: "What part of the arm is swollen?"  "Are both arms swollen or just one arm?"     Right arm firm to touch 3. SEVERITY: "How bad is the swelling?" (e.g., localized; mild, moderate, severe)   - LOCALIZED: Small area of puffiness or swelling on just one arm   - JOINT SWELLING: Swelling of one joint   - MILD: Puffiness or swelling of hand   - MODERATE: Puffiness or swollen feeling of entire arm    - SEVERE: All of arm looks swollen; pitting edema     Localized to inner right arm antecubital area 4. REDNESS: "Does the swelling look red or infected?"     Small amount of red around needle site and dark purplish  5. PAIN: "Is the swelling painful to touch?" If Yes, ask: "How painful is it?"   (Scale 1-10; mild, moderate or severe)     No pain 6. FEVER: "Do you have a fever?" If Yes, ask: "What is it, how was it measured, and when did it start?"      No  7. CAUSE: "What do you think is causing the arm swelling?"     From lab draw 8. MEDICAL HISTORY: "Do you have a history of heart  failure, kidney disease, liver failure, or cancer?"     na 9. RECURRENT SYMPTOM: "Have you had arm swelling before?" If Yes, ask: "When was the last time?" "What happened that time?"     na 10. OTHER SYMPTOMS: "Do you have any other symptoms?" (e.g., chest pain, difficulty breathing)       na 11. PREGNANCY: "Is there any chance you are pregnant?" "When was your last menstrual period?"       na  Protocols used: ARM SWELLING AND EDEMA-A-AH

## 2020-07-15 NOTE — Telephone Encounter (Signed)
Pt was here yesterday and had labs drawn has tender raised and bruise where she was drawn  . Poss (hematoma) Patient is concerned and is there anything she should be doing .   Please advise

## 2020-07-16 NOTE — Telephone Encounter (Signed)
Spoke with patient she stated someone called her after hour yesterday and told her what to do. The area is much better now

## 2020-07-17 NOTE — Telephone Encounter (Signed)
This message was addressed. On 07/16/20 and pt does have an upcoming appt in January.

## 2020-07-19 DIAGNOSIS — H60392 Other infective otitis externa, left ear: Secondary | ICD-10-CM | POA: Diagnosis not present

## 2020-07-19 DIAGNOSIS — I10 Essential (primary) hypertension: Secondary | ICD-10-CM | POA: Diagnosis not present

## 2020-07-19 DIAGNOSIS — H6122 Impacted cerumen, left ear: Secondary | ICD-10-CM | POA: Diagnosis not present

## 2020-07-20 ENCOUNTER — Other Ambulatory Visit: Payer: Self-pay | Admitting: Gastroenterology

## 2020-07-20 ENCOUNTER — Encounter: Payer: Self-pay | Admitting: Family Medicine

## 2020-07-20 DIAGNOSIS — I10 Essential (primary) hypertension: Secondary | ICD-10-CM

## 2020-07-20 MED ORDER — SPIRONOLACTONE 50 MG PO TABS: 50.0000 mg | ORAL_TABLET | Freq: Every day | ORAL | 0 refills | Status: DC

## 2020-07-20 NOTE — Telephone Encounter (Signed)
Pt requesting lab results and recommendations. Thank you

## 2020-07-29 ENCOUNTER — Other Ambulatory Visit: Payer: Self-pay

## 2020-07-29 DIAGNOSIS — E785 Hyperlipidemia, unspecified: Secondary | ICD-10-CM

## 2020-07-29 MED ORDER — LIVALO 2 MG PO TABS: ORAL_TABLET | ORAL | 1 refills | Status: DC

## 2020-09-09 DIAGNOSIS — D2271 Melanocytic nevi of right lower limb, including hip: Secondary | ICD-10-CM | POA: Diagnosis not present

## 2020-09-09 DIAGNOSIS — Z85828 Personal history of other malignant neoplasm of skin: Secondary | ICD-10-CM | POA: Diagnosis not present

## 2020-09-09 DIAGNOSIS — C44311 Basal cell carcinoma of skin of nose: Secondary | ICD-10-CM | POA: Diagnosis not present

## 2020-09-09 DIAGNOSIS — L821 Other seborrheic keratosis: Secondary | ICD-10-CM | POA: Diagnosis not present

## 2020-09-09 DIAGNOSIS — D225 Melanocytic nevi of trunk: Secondary | ICD-10-CM | POA: Diagnosis not present

## 2020-09-09 DIAGNOSIS — D485 Neoplasm of uncertain behavior of skin: Secondary | ICD-10-CM | POA: Diagnosis not present

## 2020-09-09 DIAGNOSIS — L814 Other melanin hyperpigmentation: Secondary | ICD-10-CM | POA: Diagnosis not present

## 2020-09-09 DIAGNOSIS — L578 Other skin changes due to chronic exposure to nonionizing radiation: Secondary | ICD-10-CM | POA: Diagnosis not present

## 2020-09-10 ENCOUNTER — Ambulatory Visit (INDEPENDENT_AMBULATORY_CARE_PROVIDER_SITE_OTHER): Payer: PPO | Admitting: Family Medicine

## 2020-09-10 ENCOUNTER — Other Ambulatory Visit: Payer: Self-pay

## 2020-09-10 ENCOUNTER — Encounter: Payer: Self-pay | Admitting: Family Medicine

## 2020-09-10 VITALS — BP 146/94 | HR 95 | Temp 97.5°F | Ht 61.25 in | Wt 129.0 lb

## 2020-09-10 DIAGNOSIS — R7303 Prediabetes: Secondary | ICD-10-CM

## 2020-09-10 DIAGNOSIS — Z5181 Encounter for therapeutic drug level monitoring: Secondary | ICD-10-CM

## 2020-09-10 DIAGNOSIS — I10 Essential (primary) hypertension: Secondary | ICD-10-CM | POA: Diagnosis not present

## 2020-09-10 DIAGNOSIS — E782 Mixed hyperlipidemia: Secondary | ICD-10-CM | POA: Diagnosis not present

## 2020-09-10 MED ORDER — ROSUVASTATIN CALCIUM 10 MG PO TABS: 10.0000 mg | ORAL_TABLET | Freq: Every day | ORAL | 3 refills | Status: DC

## 2020-09-10 NOTE — Patient Instructions (Addendum)
  Health Maintenance After Age 72 After age 72, you are at a higher risk for certain long-term diseases and infections as well as injuries from falls. Falls are a major cause of broken bones and head injuries in people who are older than age 72. Getting regular preventive care can help to keep you healthy and well. Preventive care includes getting regular testing and making lifestyle changes as recommended by your health care provider. Talk with your health care provider about:  Which screenings and tests you should have. A screening is a test that checks for a disease when you have no symptoms.  A diet and exercise plan that is right for you. What should I know about screenings and tests to prevent falls? Screening and testing are the best ways to find a health problem early. Early diagnosis and treatment give you the best chance of managing medical conditions that are common after age 72. Certain conditions and lifestyle choices may make you more likely to have a fall. Your health care provider may recommend:  Regular vision checks. Poor vision and conditions such as cataracts can make you more likely to have a fall. If you wear glasses, make sure to get your prescription updated if your vision changes.  Medicine review. Work with your health care provider to regularly review all of the medicines you are taking, including over-the-counter medicines. Ask your health care provider about any side effects that may make you more likely to have a fall. Tell your health care provider if any medicines that you take make you feel dizzy or sleepy.  Osteoporosis screening. Osteoporosis is a condition that causes the bones to get weaker. This can make the bones weak and cause them to break more easily.  Blood pressure screening. Blood pressure changes and medicines to control blood pressure can make you feel dizzy.  Strength and balance checks. Your health care provider may recommend certain tests to  check your strength and balance while standing, walking, or changing positions.  Foot health exam. Foot pain and numbness, as well as not wearing proper footwear, can make you more likely to have a fall.  Depression screening. You may be more likely to have a fall if you have a fear of falling, feel emotionally low, or feel unable to do activities that you used to do.  Alcohol use screening. Using too much alcohol can affect your balance and may make you more likely to have a fall. What actions can I take to lower my risk of falls? General instructions  Talk with your health care provider about your risks for falling. Tell your health care provider if: ? You fall. Be sure to tell your health care provider about all falls, even ones that seem minor. ? You feel dizzy, sleepy, or off-balance.  Take over-the-counter and prescription medicines only as told by your health care provider. These include any supplements.  Eat a healthy diet and maintain a healthy weight. A healthy diet includes low-fat dairy products, low-fat (lean) meats, and fiber from whole grains, beans, and lots of fruits and vegetables. Home safety  Remove any tripping hazards, such as rugs, cords, and clutter.  Install safety equipment such as grab bars in bathrooms and safety rails on stairs.  Keep rooms and walkways well-lit. Activity  Follow a regular exercise program to stay fit. This will help you maintain your balance. Ask your health care provider what types of exercise are appropriate for you.  If you need a cane   or walker, use it as recommended by your health care provider.  Wear supportive shoes that have nonskid soles.   Lifestyle  Do not drink alcohol if your health care provider tells you not to drink.  If you drink alcohol, limit how much you have: ? 0-1 drink a day for women. ? 0-2 drinks a day for men.  Be aware of how much alcohol is in your drink. In the [place] one drink equals one typical bottle  of beer (12 oz), one-half glass of wine (5 oz), or one shot of hard liquor (1 oz).  Do not use any products that contain nicotine or tobacco, such as cigarettes and e-cigarettes. If you need help quitting, ask your health care provider. Summary  Having a healthy lifestyle and getting preventive care can help to protect your health and wellness after age 72.  Screening and testing are the best way to find a health problem early and help you avoid having a fall. Early diagnosis and treatment give you the best chance for managing medical conditions that are more common for people who are older than age 72.  Falls are a major cause of broken bones and head injuries in people who are older than age 72. Take precautions to prevent a fall at home.  Work with your health care provider to learn what changes you can make to improve your health and wellness and to prevent falls. This information is not intended to replace advice given to you by your health care provider. Make sure you discuss any questions you have with your health care provider. Document Revised: 12/06/2018 Document Reviewed: 06/28/2017 Elsevier Patient Education  2021 Elsevier Inc.   If you have lab work done today you will be contacted with your lab results within the next 2 weeks.  If you have not heard from us then please contact us. The fastest way to get your results is to register for My Chart.   IF you received an x-ray today, you will receive an invoice from [facility]. Please contact [facility] at [phone] with questions or concerns regarding your invoice.   IF you received labwork today, you will receive an invoice from [facility]. Please contact [facility] at [phone] with questions or concerns regarding your invoice.   Our billing staff will not be able to assist you with questions regarding bills from these companies.  You will be contacted with the lab results as soon as they are available.  The fastest way to get your results is to activate your My Chart account. Instructions are located on the last page of this paperwork. If you have not heard from us regarding the results in 2 weeks, please contact this office.      

## 2020-09-10 NOTE — Progress Notes (Signed)
1/13/202211:46 AM  Amanda Curry 1950/09/10, 70 y.o., female [id number]  Chief Complaint  Patient presents with  . Transitions Of Care    HPI:   Patient is a 70 y.o. female with past medical history significant for HTN, HLD who presents today for TOC.  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, production Family lives in [place] everyone at the holidays Does yard work, watches TV  Knee replacement last oct Rehab went well Total hysterectomy in May  HTN [place] Doesn't take BP at home Didn't tolerate HCTZ BP Readings from Last 3 Encounters:  09/10/20 (!) 146/94  06/01/20 122/80  04/28/20 120/80   Seeing gynecology for LAVH  HLD Livalo once run out start crestor  Lab Results  Component Value Date   CHOL 253 (H) 07/14/2020   HDL 78 07/14/2020   [place] 152 (H) 07/14/2020   TRIG 131 07/14/2020   CHOLHDL 3.2 07/14/2020   The 10-year ASCVD risk score Mikey Bussing DC Jr., et al., 2013) is: 18%   Values used to calculate the score:     Age: 32 years     Sex: Female     Is Non-Hispanic African American: No     Diabetic: No     Tobacco smoker: No     Systolic Blood Pressure: 332 mmHg     Is BP treated: Yes     HDL Cholesterol: 78 mg/dL     Total Cholesterol: 253 mg/dL  Health Maintenance  Topic Date Due  . TETANUS/TDAP  04/28/2021 (Originally 10/17/1969)  . MAMMOGRAM  08/06/2021  . INFLUENZA VACCINE  Completed  . DEXA SCAN  Completed  . COVID-19 Vaccine  Completed  . Hepatitis C Screening  Completed  . PNA vac Low Risk Adult  Completed   Colonoscopy: 10/31/19 negative for abnormalities (completed)   Depression screen [facility] 2/9 09/10/2020 04/28/2020 02/26/2020  Decreased Interest 0 0 0  Down,  Depressed, Hopeless 0 0 0  PHQ - 2 Score 0 0 0    Fall Risk  09/10/2020 04/28/2020 02/26/2020 11/27/2019 08/14/2019  Falls in the past year? 0 0 0 0 0  Number falls in past yr: 0 0 0 0 0  Injury with Fall? 0 0 0 0 0  Risk for fall due to : - No Fall Risks - - -  Follow up Falls evaluation completed Falls evaluation completed Falls evaluation completed;Education provided Falls evaluation completed Falls evaluation completed     Allergies  Allergen Reactions  . Hctz [Hydrochlorothiazide] Other (See Comments)    Causes extreme hyponatremia quickly - 126  . Epinephrine Other (See Comments)    Brief trembling sensation   . Novocain [Procaine]     "makes me tremble"   . Amlodipine Swelling    Mild but chronic lower extremity edema  . Other     Prior to Admission medications   Medication Sig Start Date End Date Taking? Authorizing Provider  acetaminophen (TYLENOL) 500 MG tablet Take 1,000 mg by mouth every 6 (six) hours as needed for moderate pain or headache.   Yes [provider]  colestipol (COLESTID) 1 g tablet TAKE 1 TABLET(1 GRAM) BY MOUTH TWICE DAILY 07/20/20  Yes Thornton Park, MD  hydrocortisone cream 1 % Apply 1 application topically daily as needed  for itching.    Yes [provider]  melatonin 5 MG TABS Take 5 mg by mouth at bedtime.   Yes [provider]  meloxicam (MOBIC) 15 MG tablet Take 1 tablet (15 mg total) by mouth daily. 06/11/20  Yes Hyatt, Max T, DPM  Pitavastatin Calcium (LIVALO) 2 MG TABS TAKE 1/2 TABLET(1 MG) BY MOUTH DAILY 07/29/20  Yes Emmalyne Giacomo, Laurita Quint, FNP  pseudoephedrine (SUDAFED) 30 MG tablet Take 30 mg by mouth every 4 (four) hours as needed for congestion.   Yes [provider]  psyllium (METAMUCIL) 58.6 % powder Take 1 packet by mouth 3 (three) times daily.   Yes [provider]  simethicone (MYLICON) 80 MG chewable tablet Chew 80 mg by mouth every 6 (six) hours as needed for flatulence.   Yes [provider]  spironolactone (ALDACTONE) 50 MG tablet Take 1 tablet (50 mg total) by mouth daily. 07/20/20  Yes Leoni Goodness, Laurita Quint, FNP  triamcinolone cream (KENALOG) 0.1 % Apply 1 application topically daily as needed (eczema). 08/14/19  Yes Forrest Moron, MD    Past Medical History:  Diagnosis Date  . Adenomatous polyps   . Arthritis   . Diverticulosis   . Ganglion of left wrist    thumb joint  . GERD (gastroesophageal reflux disease)   . Hyperlipidemia   . Hypertension   . Pre-diabetes   . Tinnitus     Past Surgical History:  Procedure Laterality Date  . ABDOMINAL HYSTERECTOMY N/A    Phreesia 02/23/2020  . ANTERIOR AND POSTERIOR REPAIR WITH SACROSPINOUS FIXATION N/A 01/20/2020   Procedure: ANTERIOR AND POSTERIOR REPAIR WITH SACROSPINOUS FIXATION;  Surgeon: [name], MD;  Location: [facility];  Service: Gynecology;  Laterality: N/A;  . COLONOSCOPY     x 3  . CYSTOSCOPY N/A 01/20/2020   Procedure: CYSTOSCOPY;  Surgeon: [name], MD;  Location[facility];  Service: Gynecology;  Laterality: N/A;  . JOINT REPLACEMENT Right 05/2019  . LAPAROSCOPIC VAGINAL HYSTERECTOMY WITH SALPINGO OOPHORECTOMY Bilateral 01/20/2020   Procedure: LAPAROSCOPIC ASSISTED VAGINAL HYSTERECTOMY WITH SALPINGO OOPHORECTOMY;  Surgeon: [name], MD;  Location: [facility];  Service: Gynecology;  Laterality: Bilateral;  NEED BED  . POLYPECTOMY    . TONSILLECTOMY    . TOTAL KNEE ARTHROPLASTY Right 06/24/2019   Procedure: TOTAL KNEE ARTHROPLASTY;  Surgeon: Gaynelle Arabian, MD;  Location: [facility];  Service: Orthopedics;  Laterality: Right;  51min  . TUBAL LIGATION      Social History   Tobacco Use  . Smoking status: Never Smoker  . Smokeless tobacco: Never Used  Substance Use Topics  . Alcohol use: Yes    Comment: rarely; 1-2x/mo    Family History  Problem Relation Age of Onset  . Colon cancer Maternal Grandmother   . Cancer Maternal Grandmother         colon  . Prostate cancer Father   . Cancer Father        prostate  . Alzheimer's disease Mother   . Hyperlipidemia Mother   . Hypertension Mother   . Heart disease Paternal Grandmother   . Heart disease Paternal Grandfather   . Hypertension Sister   . Colon polyps Neg Hx   . Esophageal cancer Neg Hx   . Rectal cancer Neg Hx   . Stomach cancer Neg Hx     Review of Systems  Constitutional: Negative for chills, fever and malaise/fatigue.  Eyes: Negative for blurred vision and double vision.  Respiratory: Negative for cough, shortness of breath and wheezing.   Cardiovascular: Negative for chest pain, palpitations and leg swelling.  Gastrointestinal: Negative for abdominal pain, blood in stool, constipation, diarrhea, heartburn, nausea and vomiting.  Genitourinary: Negative for dysuria, frequency and hematuria.  Musculoskeletal: Negative for back pain and joint pain.  Skin: Negative for rash.  Neurological: Negative for dizziness, weakness and headaches.     OBJECTIVE:  Today's Vitals   09/10/20 1021 09/10/20 1041  BP: (!) 168/93 (!) 146/94  Pulse: 95   Temp: (!) 97.5 F (36.4 C)   SpO2: 98%   Weight: 129 lb (58.5 kg)   Height: 5' 1.25" (1.556 m)    Body mass index is 24.18 kg/m.   Physical Exam Constitutional:      General: She is not in acute distress.    Appearance: Normal appearance. She is not ill-appearing.  HENT:     Head: Normocephalic.  Cardiovascular:     Rate and Rhythm: Normal rate and regular rhythm.     Pulses: Normal pulses.     Heart sounds: Normal heart sounds. No murmur heard. No friction rub. No gallop.   Pulmonary:     Effort: Pulmonary effort is normal. No respiratory distress.     Breath sounds: Normal breath sounds. No stridor. No wheezing, rhonchi or rales.  Abdominal:     General: Bowel sounds are normal.     Palpations: Abdomen is soft.     Tenderness: There is no abdominal tenderness.  Musculoskeletal:     Right lower leg:  No edema.     Left lower leg: No edema.  Skin:    General: Skin is warm and dry.  Neurological:     Mental Status: She is alert and oriented to person, place, and time.  Psychiatric:        Mood and Affect: Mood normal.        Behavior: Behavior normal.     No results found for this or any previous visit (from the past 24 hour(s)).  No results found.   ASSESSMENT and PLAN  Problem List Items Addressed This Visit      Other   Hyperlipidemia - Primary   Relevant Medications   rosuvastatin (CRESTOR) 10 MG tablet   Prediabetes    Other Visit Diagnoses    Essential hypertension       Relevant Medications   rosuvastatin (CRESTOR) 10 MG tablet   Medication monitoring encounter         Plan  Once run out of livalo start Crestor  Will recheck labs next appointment  Return in about 6 months (around 03/10/2021).    Huston Foley Asencion Loveday, FNP-BC [facility] at [facility], [place] [address] Ph.  [phone] Fax [phone]

## 2020-09-15 ENCOUNTER — Other Ambulatory Visit: Payer: Self-pay | Admitting: Family Medicine

## 2020-09-25 ENCOUNTER — Telehealth: Payer: Self-pay

## 2020-10-06 DIAGNOSIS — Z124 Encounter for screening for malignant neoplasm of cervix: Secondary | ICD-10-CM | POA: Diagnosis not present

## 2020-10-06 DIAGNOSIS — Z1231 Encounter for screening mammogram for malignant neoplasm of breast: Secondary | ICD-10-CM | POA: Diagnosis not present

## 2020-10-06 DIAGNOSIS — Z6824 Body mass index (BMI) 24.0-24.9, adult: Secondary | ICD-10-CM | POA: Diagnosis not present

## 2020-10-21 DIAGNOSIS — M65331 Trigger finger, right middle finger: Secondary | ICD-10-CM | POA: Diagnosis not present

## 2020-10-21 DIAGNOSIS — M65332 Trigger finger, left middle finger: Secondary | ICD-10-CM | POA: Diagnosis not present

## 2020-10-22 ENCOUNTER — Other Ambulatory Visit: Payer: Self-pay | Admitting: Family Medicine

## 2020-10-22 DIAGNOSIS — I10 Essential (primary) hypertension: Secondary | ICD-10-CM

## 2020-11-02 ENCOUNTER — Encounter: Payer: Self-pay | Admitting: Family Medicine

## 2020-11-26 DIAGNOSIS — C44311 Basal cell carcinoma of skin of nose: Secondary | ICD-10-CM | POA: Diagnosis not present

## 2020-12-08 ENCOUNTER — Ambulatory Visit (INDEPENDENT_AMBULATORY_CARE_PROVIDER_SITE_OTHER): Payer: PPO | Admitting: Podiatry

## 2020-12-08 ENCOUNTER — Encounter: Payer: Self-pay | Admitting: Podiatry

## 2020-12-08 ENCOUNTER — Other Ambulatory Visit: Payer: Self-pay

## 2020-12-08 DIAGNOSIS — M778 Other enthesopathies, not elsewhere classified: Secondary | ICD-10-CM | POA: Diagnosis not present

## 2020-12-08 DIAGNOSIS — M19072 Primary osteoarthritis, left ankle and foot: Secondary | ICD-10-CM | POA: Diagnosis not present

## 2020-12-08 NOTE — Progress Notes (Signed)
She presents today concerned about her toes particularly second toes and the discoloration of the nail she is also concerned about the calluses and the the fact that her toes get hot at night and turn red.  Objective: Vital signs are stable she is alert oriented x3.  Pulses are palpable.  Neurologic sensorium is intact per Semmes Weinstein monofilament.  She does have mild hammertoe deformities with some subungual hematoma to the second nail plates bilateral.  They hallux do demonstrate interphalangeal joint pinch calluses medially otherwise no open lesions wounds are noted.  Assessment: Benign skin lesions bilateral hammertoe deformity with resultant subungual hematoma second digit bilateral and probable neuropathy bilateral forefoot.  Plan: At this point we discussed topical therapies for the burning and tingling for the nocturnal burning such as Voltaren gel and Biofreeze.

## 2020-12-16 DIAGNOSIS — Z96651 Presence of right artificial knee joint: Secondary | ICD-10-CM | POA: Diagnosis not present

## 2020-12-23 DIAGNOSIS — Z4789 Encounter for other orthopedic aftercare: Secondary | ICD-10-CM | POA: Diagnosis not present

## 2020-12-23 DIAGNOSIS — M65331 Trigger finger, right middle finger: Secondary | ICD-10-CM | POA: Diagnosis not present

## 2020-12-28 DIAGNOSIS — M25561 Pain in right knee: Secondary | ICD-10-CM | POA: Diagnosis not present

## 2020-12-28 DIAGNOSIS — Z96651 Presence of right artificial knee joint: Secondary | ICD-10-CM | POA: Diagnosis not present

## 2021-01-05 DIAGNOSIS — R319 Hematuria, unspecified: Secondary | ICD-10-CM | POA: Diagnosis not present

## 2021-01-05 DIAGNOSIS — R338 Other retention of urine: Secondary | ICD-10-CM | POA: Diagnosis not present

## 2021-01-05 DIAGNOSIS — N958 Other specified menopausal and perimenopausal disorders: Secondary | ICD-10-CM | POA: Diagnosis not present

## 2021-01-05 DIAGNOSIS — N812 Incomplete uterovaginal prolapse: Secondary | ICD-10-CM | POA: Diagnosis not present

## 2021-01-06 DIAGNOSIS — M79641 Pain in right hand: Secondary | ICD-10-CM | POA: Diagnosis not present

## 2021-01-13 DIAGNOSIS — M79641 Pain in right hand: Secondary | ICD-10-CM | POA: Diagnosis not present

## 2021-01-20 DIAGNOSIS — Z96651 Presence of right artificial knee joint: Secondary | ICD-10-CM | POA: Diagnosis not present

## 2021-01-20 DIAGNOSIS — M79641 Pain in right hand: Secondary | ICD-10-CM | POA: Diagnosis not present

## 2021-01-22 ENCOUNTER — Other Ambulatory Visit: Payer: Self-pay

## 2021-01-22 ENCOUNTER — Ambulatory Visit (INDEPENDENT_AMBULATORY_CARE_PROVIDER_SITE_OTHER): Payer: PPO | Admitting: Internal Medicine

## 2021-01-22 ENCOUNTER — Encounter: Payer: Self-pay | Admitting: Internal Medicine

## 2021-01-22 VITALS — BP 136/80 | HR 95 | Temp 98.9°F | Resp 18 | Ht 61.25 in | Wt 133.0 lb

## 2021-01-22 DIAGNOSIS — I10 Essential (primary) hypertension: Secondary | ICD-10-CM | POA: Diagnosis not present

## 2021-01-22 DIAGNOSIS — Z0000 Encounter for general adult medical examination without abnormal findings: Secondary | ICD-10-CM

## 2021-01-22 DIAGNOSIS — E785 Hyperlipidemia, unspecified: Secondary | ICD-10-CM

## 2021-01-22 DIAGNOSIS — M1990 Unspecified osteoarthritis, unspecified site: Secondary | ICD-10-CM | POA: Diagnosis not present

## 2021-01-22 DIAGNOSIS — R7303 Prediabetes: Secondary | ICD-10-CM

## 2021-01-22 DIAGNOSIS — Z0001 Encounter for general adult medical examination with abnormal findings: Secondary | ICD-10-CM

## 2021-01-22 LAB — POCT GLYCOSYLATED HEMOGLOBIN (HGB A1C): Hemoglobin A1C: 6 % — AB (ref 4.0–5.6)

## 2021-01-22 MED ORDER — LIVALO 2 MG PO TABS: ORAL_TABLET | ORAL | 3 refills | Status: DC

## 2021-01-22 MED ORDER — SPIRONOLACTONE 50 MG PO TABS: 50.0000 mg | ORAL_TABLET | Freq: Every day | ORAL | 3 refills | Status: DC

## 2021-01-22 NOTE — Patient Instructions (Addendum)
Your HgA1c is 6.0 today  Health Maintenance, Female Adopting a healthy lifestyle and getting preventive care are important in promoting health and wellness. Ask your health care provider about:  The right schedule for you to have regular tests and exams.  Things you can do on your own to prevent diseases and keep yourself healthy. What should I know about diet, weight, and exercise? Eat a healthy diet  Eat a diet that includes plenty of vegetables, fruits, low-fat dairy products, and lean protein.  Do not eat a lot of foods that are high in solid fats, added sugars, or sodium.   Maintain a healthy weight Body mass index (BMI) is used to identify weight problems. It estimates body fat based on height and weight. Your health care provider can help determine your BMI and help you achieve or maintain a healthy weight. Get regular exercise Get regular exercise. This is one of the most important things you can do for your health. Most adults should:  Exercise for at least 150 minutes each week. The exercise should increase your heart rate and make you sweat (moderate-intensity exercise).  Do strengthening exercises at least twice a week. This is in addition to the moderate-intensity exercise.  Spend less time sitting. Even light physical activity can be beneficial. Watch cholesterol and blood lipids Have your blood tested for lipids and cholesterol at 72 years of age, then have this test every 5 years. Have your cholesterol levels checked more often if:  Your lipid or cholesterol levels are high.  You are older than 72 years of age.  You are at high risk for heart disease. What should I know about cancer screening? Depending on your health history and family history, you may need to have cancer screening at various ages. This may include screening for:  Breast cancer.  Cervical cancer.  Colorectal cancer.  Skin cancer.  Lung cancer. What should I know about heart disease,  diabetes, and high blood pressure? Blood pressure and heart disease  High blood pressure causes heart disease and increases the risk of stroke. This is more likely to develop in people who have high blood pressure readings, are of African descent, or are overweight.  Have your blood pressure checked: ? Every 3-5 years if you are 10-5 years of age. ? Every year if you are 54 years old or older. Diabetes Have regular diabetes screenings. This checks your fasting blood sugar level. Have the screening done:  Once every three years after age 41 if you are at a normal weight and have a low risk for diabetes.  More often and at a younger age if you are overweight or have a high risk for diabetes. What should I know about preventing infection? Hepatitis B If you have a higher risk for hepatitis B, you should be screened for this virus. Talk with your health care provider to find out if you are at risk for hepatitis B infection. Hepatitis C Testing is recommended for:  Everyone born from [birth year] through 1967.  Anyone with known risk factors for hepatitis C. Sexually transmitted infections (STIs)  Get screened for STIs, including gonorrhea and chlamydia, if: ? You are sexually active and are younger than 72 years of age. ? You are older than 71 years of age and your health care provider tells you that you are at risk for this type of infection. ? Your sexual activity has changed since you were last screened, and you are at increased risk for chlamydia or  gonorrhea. Ask your health care provider if you are at risk.  Ask your health care provider about whether you are at high risk for HIV. Your health care provider may recommend a prescription medicine to help prevent HIV infection. If you choose to take medicine to prevent HIV, you should first get tested for HIV. You should then be tested every 3 months for as long as you are taking the medicine. Pregnancy  If you are about to stop having your  period (premenopausal) and you may become pregnant, seek counseling before you get pregnant.  Take 400 to 800 micrograms (mcg) of folic acid every day if you become pregnant.  Ask for birth control (contraception) if you want to prevent pregnancy. Osteoporosis and menopause Osteoporosis is a disease in which the bones lose minerals and strength with aging. This can result in bone fractures. If you are 81 years old or older, or if you are at risk for osteoporosis and fractures, ask your health care provider if you should:  Be screened for bone loss.  Take a calcium or vitamin D supplement to lower your risk of fractures.  Be given hormone replacement therapy (HRT) to treat symptoms of menopause. Follow these instructions at home: Lifestyle  Do not use any products that contain nicotine or tobacco, such as cigarettes, e-cigarettes, and chewing tobacco. If you need help quitting, ask your health care provider.  Do not use street drugs.  Do not share needles.  Ask your health care provider for help if you need support or information about quitting drugs. Alcohol use  Do not drink alcohol if: ? Your health care provider tells you not to drink. ? You are pregnant, may be pregnant, or are planning to become pregnant.  If you drink alcohol: ? Limit how much you use to 0-1 drink a day. ? Limit intake if you are breastfeeding.  Be aware of how much alcohol is in your drink. In the [place] one drink equals one 12 oz bottle of beer (355 mL), one 5 oz glass of wine (148 mL), or one 1 oz glass of hard liquor (44 mL). General instructions  Schedule regular health, dental, and eye exams.  Stay current with your vaccines.  Tell your health care provider if: ? You often feel depressed. ? You have ever been abused or do not feel safe at home. Summary  Adopting a healthy lifestyle and getting preventive care are important in promoting health and wellness.  Follow your health care provider's  instructions about healthy diet, exercising, and getting tested or screened for diseases.  Follow your health care provider's instructions on monitoring your cholesterol and blood pressure. This information is not intended to replace advice given to you by your health care provider. Make sure you discuss any questions you have with your health care provider. Document Revised: 08/08/2018 Document Reviewed: 08/08/2018 Elsevier Patient Education  2021 [employer].

## 2021-01-22 NOTE — Assessment & Plan Note (Signed)
Last lipid panel acceptable on pitavastatin 1 mg daily. Rx done for this today and she is increasing exercise since she has had knee replacement.

## 2021-01-22 NOTE — Progress Notes (Signed)
   Subjective:   Patient ID: Amanda Curry, female    DOB: Aug 02, 1951, 70 y.o.   MRN: [id number]  HPI The patient is a new 70 YO female coming in for blood pressure (taking spironolactone and no side effects BP at goal at home) and cholesterol (taking pitavastatin and recent lipid panel acceptable, side effects with other medications in the past) and pre-diabetes (no known family history diabetes, HgA1c 6.1 last year most recent, denies change in diet, exercising more since knee replacement).   Also requesting physical.   PMH, [place], social history reviewed and updated  Review of Systems  Constitutional: Negative.   HENT: Negative.   Eyes: Negative.   Respiratory: Negative for cough, chest tightness and shortness of breath.   Cardiovascular: Negative for chest pain, palpitations and leg swelling.  Gastrointestinal: Negative for abdominal distention, abdominal pain, constipation, diarrhea, nausea and vomiting.  Musculoskeletal: Negative.   Skin: Negative.   Neurological: Negative.   Psychiatric/Behavioral: Negative.     Objective:  Physical Exam Constitutional:      Appearance: She is well-developed.  HENT:     Head: Normocephalic and atraumatic.  Cardiovascular:     Rate and Rhythm: Normal rate and regular rhythm.  Pulmonary:     Effort: Pulmonary effort is normal. No respiratory distress.     Breath sounds: Normal breath sounds. No wheezing or rales.  Abdominal:     General: Bowel sounds are normal. There is no distension.     Palpations: Abdomen is soft.     Tenderness: There is no abdominal tenderness. There is no rebound.  Musculoskeletal:     Cervical back: Normal range of motion.  Skin:    General: Skin is warm and dry.  Neurological:     Mental Status: She is alert and oriented to person, place, and time.     Coordination: Coordination normal.     Vitals:   01/22/21 1325  BP: 136/80  Pulse: 95  Resp: 18  Temp: 98.9 F (37.2 C)  TempSrc: Oral  SpO2: 96%   Weight: 133 lb (60.3 kg)  Height: 5' 1.25" (1.556 m)    This visit occurred during the SARS-CoV-2 public health emergency.  Safety protocols were in place, including screening questions prior to the visit, additional usage of staff PPE, and extensive cleaning of exam room while observing appropriate contact time as indicated for disinfecting solutions.   Assessment & Plan:

## 2021-01-22 NOTE — Assessment & Plan Note (Signed)
Flu shot yearly. Covid-19 3 shots counseled about 4th. Pneumonia complete. Shingrix complete. Tetanus due declines. Colonoscopy aged out prior to recall. Mammogram due 2022 later, pap smear aged out and dexa due 2023 and then no more if normal. Counseled about sun safety and mole surveillance. Counseled about the dangers of distracted driving. Given 10 year screening recommendations.

## 2021-01-22 NOTE — Assessment & Plan Note (Signed)
POC HgA1c done today at 6.0. We did counsel and discuss pre-diabetes and risk for progression to diabetes. Work on increasing exercise and reducing carbohydrates and sugars.

## 2021-01-22 NOTE — Assessment & Plan Note (Signed)
Recovering from right trigger finger surgery and doing well s/p right knee placement. Counseled about exercise to maintain and grow muscle strength.

## 2021-01-22 NOTE — Assessment & Plan Note (Signed)
Taking spironolactone, last K normal. Rx for this today 1 year supply. BP at goal.

## 2021-02-01 DIAGNOSIS — M79641 Pain in right hand: Secondary | ICD-10-CM | POA: Diagnosis not present

## 2021-02-02 DIAGNOSIS — Z96651 Presence of right artificial knee joint: Secondary | ICD-10-CM | POA: Diagnosis not present

## 2021-02-10 DIAGNOSIS — N3946 Mixed incontinence: Secondary | ICD-10-CM | POA: Diagnosis not present

## 2021-02-10 DIAGNOSIS — R319 Hematuria, unspecified: Secondary | ICD-10-CM | POA: Diagnosis not present

## 2021-02-15 ENCOUNTER — Encounter: Payer: Self-pay | Admitting: Internal Medicine

## 2021-02-16 ENCOUNTER — Other Ambulatory Visit: Payer: Self-pay

## 2021-02-16 ENCOUNTER — Telehealth (INDEPENDENT_AMBULATORY_CARE_PROVIDER_SITE_OTHER): Payer: PPO | Admitting: Internal Medicine

## 2021-02-16 ENCOUNTER — Encounter: Payer: Self-pay | Admitting: Internal Medicine

## 2021-02-16 DIAGNOSIS — N3000 Acute cystitis without hematuria: Secondary | ICD-10-CM | POA: Diagnosis not present

## 2021-02-16 DIAGNOSIS — N390 Urinary tract infection, site not specified: Secondary | ICD-10-CM | POA: Insufficient documentation

## 2021-02-16 MED ORDER — CEPHALEXIN 500 MG PO CAPS
500.0000 mg | ORAL_CAPSULE | Freq: Two times a day (BID) | ORAL | 0 refills | Status: DC
Start: 2021-02-16 — End: 2021-06-03

## 2021-02-16 NOTE — Assessment & Plan Note (Signed)
Suspect related to recent urological procedure. Rx keflex 5 day course.

## 2021-02-16 NOTE — Progress Notes (Signed)
Virtual Visit via Video Note  I connected with [name] on 02/16/21 at  2:20 PM EDT by a video enabled telemedicine application and verified that I am speaking with the correct person using two identifiers.  The patient and the provider were at separate locations throughout the entire encounter. Patient location: home, Provider location: work   I discussed the limitations of evaluation and management by telemedicine and the availability of in person appointments. The patient expressed understanding and agreed to proceed. The patient and the provider were the only parties present for the visit unless noted in HPI below.  History of Present Illness: The patient is a 70 y.o. female with visit for stomach problems. Started Saturday morning. Has pain in the stomach and fatigue. Sunday afternoon got worse and 99.5 F. 100.4 F this morning. Denies SOB or cough. Pain on the side of stomach both sides. Denies nausea or vomiting, denies constipation or diarrhea. Urine foamy and cloudy same time. Overall it is not improving. Has tried drinking more water. Has uro testing last week with catheter.   Observations/Objective: Appearance: normal, breathing appears normal, casual grooming, a and o times 3  Assessment and Plan: See problem oriented charting  Follow Up Instructions: rx keflex 5 day   I discussed the assessment and treatment plan with the patient. The patient was provided an opportunity to ask questions and all were answered. The patient agreed with the plan and demonstrated an understanding of the instructions.   The patient was advised to call back or seek an in-person evaluation if the symptoms worsen or if the condition fails to improve as anticipated.  Hoyt Koch, MD

## 2021-02-19 ENCOUNTER — Encounter: Payer: Self-pay | Admitting: Internal Medicine

## 2021-03-03 ENCOUNTER — Encounter: Payer: Self-pay | Admitting: Internal Medicine

## 2021-03-09 DIAGNOSIS — H2513 Age-related nuclear cataract, bilateral: Secondary | ICD-10-CM | POA: Diagnosis not present

## 2021-03-09 DIAGNOSIS — H5213 Myopia, bilateral: Secondary | ICD-10-CM | POA: Diagnosis not present

## 2021-03-10 ENCOUNTER — Ambulatory Visit: Payer: PPO | Admitting: Family Medicine

## 2021-03-30 DIAGNOSIS — N814 Uterovaginal prolapse, unspecified: Secondary | ICD-10-CM | POA: Diagnosis not present

## 2021-04-06 DIAGNOSIS — R339 Retention of urine, unspecified: Secondary | ICD-10-CM | POA: Diagnosis not present

## 2021-04-06 DIAGNOSIS — N814 Uterovaginal prolapse, unspecified: Secondary | ICD-10-CM | POA: Diagnosis not present

## 2021-04-16 DIAGNOSIS — I10 Essential (primary) hypertension: Secondary | ICD-10-CM | POA: Diagnosis not present

## 2021-04-16 DIAGNOSIS — N952 Postmenopausal atrophic vaginitis: Secondary | ICD-10-CM | POA: Diagnosis not present

## 2021-04-16 DIAGNOSIS — R339 Retention of urine, unspecified: Secondary | ICD-10-CM | POA: Diagnosis not present

## 2021-04-16 DIAGNOSIS — N905 Atrophy of vulva: Secondary | ICD-10-CM | POA: Diagnosis not present

## 2021-04-16 DIAGNOSIS — N814 Uterovaginal prolapse, unspecified: Secondary | ICD-10-CM | POA: Diagnosis not present

## 2021-04-22 DIAGNOSIS — Z48816 Encounter for surgical aftercare following surgery on the genitourinary system: Secondary | ICD-10-CM | POA: Diagnosis not present

## 2021-05-04 DIAGNOSIS — R399 Unspecified symptoms and signs involving the genitourinary system: Secondary | ICD-10-CM | POA: Diagnosis not present

## 2021-05-25 DIAGNOSIS — Z9889 Other specified postprocedural states: Secondary | ICD-10-CM | POA: Diagnosis not present

## 2021-06-03 ENCOUNTER — Encounter: Payer: Self-pay | Admitting: Internal Medicine

## 2021-06-03 ENCOUNTER — Ambulatory Visit (INDEPENDENT_AMBULATORY_CARE_PROVIDER_SITE_OTHER): Payer: PPO

## 2021-06-03 DIAGNOSIS — Z0000 Encounter for general adult medical examination without abnormal findings: Secondary | ICD-10-CM

## 2021-06-03 NOTE — Progress Notes (Signed)
I connected with Amanda Curry today by telephone and verified that I am speaking with the correct person using two identifiers. Location patient: home Location provider: work Persons participating in the virtual visit: patient, provider.   I discussed the limitations, risks, security and privacy concerns of performing an evaluation and management service by telephone and the availability of in person appointments. I also discussed with the patient that there may be a patient responsible charge related to this service. The patient expressed understanding and verbally consented to this telephonic visit.    Interactive audio and video telecommunications were attempted between this provider and patient, however failed, due to patient having technical difficulties OR patient did not have access to video capability.  We continued and completed visit with audio only.  Some vital signs may be absent or patient reported.   Time Spent with patient on telephone encounter: 40 minutes  Subjective:   Amanda Curry is a 70 y.o. female who presents for Medicare Annual (Subsequent) preventive examination.  Review of Systems     Cardiac Risk Factors include: advanced age (>28men, >3 women);dyslipidemia;hypertension;family history of premature cardiovascular disease     Objective:    There were no vitals filed for this visit. There is no height or weight on file to calculate BMI.  Advanced Directives 06/03/2021 02/26/2020 01/27/2020 01/20/2020 01/10/2020 06/24/2019 06/19/2019  Does Patient Have a Medical Advance Directive? Yes Yes Yes Yes Yes Yes Yes  Type of Advance Directive Living will;Healthcare Power of Attorney Living will;Healthcare Power of [place];Living will [place];Living will Living will;Healthcare Power of Attorney Living will;Healthcare Power of Attorney  Does patient want to make changes to medical advance  directive? No - Patient declined Yes (ED - Information included in AVS) Yes (ED - Information included in AVS) No - Patient declined - No - Patient declined No - Patient declined  Copy of [place] in Chart? No - copy requested No - copy requested No - copy requested No - copy requested - No - copy requested -  Would patient like information on creating a medical advance directive? - - Yes (ED - Information included in AVS) - - - -    Current Medications (verified) Outpatient Encounter Medications as of 06/03/2021  Medication Sig   colestipol (COLESTID) 1 g tablet TAKE 1 TABLET(1 GRAM) BY MOUTH TWICE DAILY   estradiol (ESTRACE) 0.1 MG/GM vaginal cream    hydrocortisone cream 1 % Apply 1 application topically daily as needed for itching.    melatonin 5 MG TABS Take 5 mg by mouth at bedtime.   Pitavastatin Calcium (LIVALO) 2 MG TABS TAKE 1/2 TABLET(1 MG) BY MOUTH DAILY   pseudoephedrine (SUDAFED) 30 MG tablet Take 30 mg by mouth every 4 (four) hours as needed for congestion.   psyllium (METAMUCIL) 58.6 % powder Take 1 packet by mouth 3 (three) times daily.   simethicone (MYLICON) 80 MG chewable tablet Chew 80 mg by mouth every 6 (six) hours as needed for flatulence.   spironolactone (ALDACTONE) 50 MG tablet Take 1 tablet (50 mg total) by mouth daily.   triamcinolone cream (KENALOG) 0.1 % Apply 1 application topically daily as needed (eczema).   [DISCONTINUED] cephALEXin (KEFLEX) 500 MG capsule Take 1 capsule (500 mg total) by mouth 2 (two) times daily.   No facility-administered encounter medications on file as of 06/03/2021.    Allergies (verified) Hctz [hydrochlorothiazide], Epinephrine, Novocain [procaine], Olmesartan, Amlodipine, and Other  History: Past Medical History:  Diagnosis Date   Adenomatous polyps    Arthritis    Diverticulosis    Ganglion of left wrist    thumb joint   GERD (gastroesophageal reflux disease)    Hyperlipidemia    Hypertension     Pre-diabetes    Tinnitus    Past Surgical History:  Procedure Laterality Date   ABDOMINAL HYSTERECTOMY N/A    Phreesia 02/23/2020   ANTERIOR AND POSTERIOR REPAIR WITH SACROSPINOUS FIXATION N/A 01/20/2020   Procedure: ANTERIOR AND POSTERIOR REPAIR WITH SACROSPINOUS FIXATION;  Surgeon: [name], MD;  Location: [facility];  Service: Gynecology;  Laterality: N/A;   COLONOSCOPY     x 3   CYSTOSCOPY N/A 01/20/2020   Procedure: CYSTOSCOPY;  Surgeon: [name], MD;  Location: [facility];  Service: Gynecology;  Laterality: N/A;   JOINT REPLACEMENT Right 05/2019   LAPAROSCOPIC VAGINAL HYSTERECTOMY WITH SALPINGO OOPHORECTOMY Bilateral 01/20/2020   Procedure: LAPAROSCOPIC ASSISTED VAGINAL HYSTERECTOMY WITH SALPINGO OOPHORECTOMY;  Surgeon: [name], MD;  Location: [facility];  Service: Gynecology;  Laterality: Bilateral;  NEED BED   POLYPECTOMY     right trigger finger correction surgery     TONSILLECTOMY     TOTAL KNEE ARTHROPLASTY Right 06/24/2019   Procedure: TOTAL KNEE ARTHROPLASTY;  Surgeon: Gaynelle Arabian, MD;  Location: [facility];  Service: Orthopedics;  Laterality: Right;  46min   TUBAL LIGATION     Family History  Problem Relation Age of Onset   Colon cancer Maternal Grandmother    Cancer Maternal Grandmother        colon   Prostate cancer Father    Cancer Father        prostate   Alzheimer's disease Mother    Hyperlipidemia Mother    Hypertension Mother    Heart disease Paternal Grandmother    Heart disease Paternal Grandfather    Hypertension Sister    Colon polyps Neg Hx    Esophageal cancer Neg Hx    Rectal cancer Neg Hx    Stomach cancer Neg Hx    Social History   Socioeconomic History   Marital status: Married    Spouse name: [name]   Number of children: 4   Years of education: Not on file   Highest education level: Not on file  Occupational History   Occupation: retired    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   Adult children. Two live in [place].   Social Determinants of Health   Financial Resource Strain: Not on file  Food Insecurity: Not on file  Transportation Needs: Not on file  Physical Activity: Not on file  Stress: Not on file  Social Connections: Not on file    Tobacco Counseling Counseling given: Not Answered   Clinical Intake:  Pre-visit preparation completed: Yes  Pain : No/denies pain     Nutritional Risks: None Diabetes: No  How often do you need to have someone help you when you read instructions, pamphlets, or other written materials from your doctor or pharmacy?: 1 - Never  What is the last grade level you completed in school?: Bachelor's Degree; courses in [employer] Program [facility] [employer] [facility])  Diabetic? no  Interpreter Needed?: No  Information entered by :: Lisette Abu, LPN   Activities of Daily Living In your present state of health, do you have any difficulty performing the following activities: 06/03/2021  Hearing? N  Vision? N  Difficulty concentrating or making decisions? N  Walking or climbing stairs? N  Dressing or bathing? N  Doing errands, shopping? N  Preparing Food and eating ? N  Using the Toilet? N  In the past six months, have you accidently leaked urine? N  Do you have problems with loss of bowel control? N  Managing your Medications? N  Managing your Finances? N  Housekeeping or managing your Housekeeping? N  Some recent data might be hidden    Patient Care Team: Hoyt Koch, MD as PCP - General (Internal Medicine) [name], MD as  Consulting Physician (Obstetrics and Gynecology) Pedro Earls, MD as Attending Physician (Family Medicine) Luberta Mutter, MD as Consulting Physician (Ophthalmology)  Indicate any recent Medical Services you may have received from other than [facility] providers in the past year (date may be approximate).     Assessment:   This is a routine wellness examination for Chisa.  Hearing/Vision screen Hearing Screening - Comments:: Patient denied any hearing difficulty.   No hearing aids.  Vision Screening - Comments:: Patient wears corrective glasses/contacts.  Eye exam done annually by: Luberta Mutter, MD.  Dietary issues and exercise activities discussed: Current Exercise Habits: Home exercise routine, Type of exercise: walking;Other - see comments (stationary bike), Time (Minutes): 30, Frequency (Times/Week): 5, Weekly Exercise (Minutes/Week): 150, Intensity: Moderate, Exercise limited by: orthopedic condition(s)   Goals Addressed   None   Depression Screen PHQ 2/9 Scores 06/03/2021 09/10/2020 04/28/2020 02/26/2020 11/27/2019 08/14/2019 07/10/2019  PHQ - 2 Score 0 0 0 0 0 0 0    Fall Risk Fall Risk  06/03/2021 09/10/2020 04/28/2020 02/26/2020 11/27/2019  Falls in the past year? 0 0 0 0 0  Number falls in past yr: 0 0 0 0 0  Injury with Fall? 0 0 0 0 0  Risk for fall due to : No Fall Risks - No Fall Risks - -  Follow up Falls evaluation completed Falls evaluation completed Falls evaluation completed Falls evaluation completed;Education provided Falls evaluation completed    [place]:  Any stairs in or around the home? Yes  If so, are there any without handrails? No  Home free of loose throw rugs in walkways, pet beds, electrical cords, etc? Yes  Adequate lighting in your home to reduce risk of falls? Yes   ASSISTIVE DEVICES UTILIZED TO PREVENT FALLS:  Life alert? No  Use of a cane, walker or w/c? No  Grab bars in the bathroom? No  Shower chair or  bench in shower? No  Elevated toilet seat or a handicapped toilet? No   TIMED UP AND GO:  Was the test performed? No .  Length of time to ambulate 10 feet: n/a sec.   Gait steady and fast without use of assistive device  Cognitive Function: Normal cognitive status assessed by direct observation by this Nurse Health Advisor. No abnormalities found.       6CIT Screen 02/26/2020 02/25/2019 05/05/2017  What Year? 0 points 0 points 0 points  What month? 0 points 0 points 0 points  What time? 0 points 0 points 0 points  Count back from 20 0 points 0 points 0 points  Months in reverse 0 points 0 points 0 points  Repeat phrase 0 points 0 points 0 points  Total Score 0 0 0    Immunizations Immunization History  Administered Date(s) Administered   Fluad Quad(high Dose 65+) 05/29/2019   Influenza, High Dose Seasonal PF 06/18/2018   Influenza,inj,Quad PF,6+ Mos 06/19/2013   Influenza,inj,quad, With Preservative 04/29/2018, 05/02/2019   Influenza-Unspecified 06/15/2015, 06/15/2016, 07/04/2017, 06/09/2020   Moderna Sars-Covid-2 Vaccination 10/11/2019, 11/10/2019, 07/10/2020   Pneumococcal Conjugate-13 09/08/2015   Pneumococcal Polysaccharide-23 11/02/2013   Zoster Recombinat (Shingrix) 06/02/2018, 08/08/2018   Zoster, Live 06/28/2016    TDAP status: Due, Education has been provided regarding the importance of this vaccine. Advised may receive this vaccine at local pharmacy or Health Dept. Aware to provide a copy of the vaccination record if obtained from local pharmacy or Health Dept. Verbalized acceptance and understanding.  Flu Vaccine status: Due, Education has been provided regarding the importance of this vaccine. Advised may receive this vaccine at local pharmacy or Health Dept. Aware to provide a copy of the vaccination record if obtained from local pharmacy or Health Dept. Verbalized acceptance and understanding.  Pneumococcal vaccine status: Up to date  Covid-19 vaccine status:  Completed vaccines  Qualifies for Shingles Vaccine? Yes   Zostavax completed Yes   Shingrix Completed?: Yes  Screening Tests Health Maintenance  Topic Date Due   TETANUS/TDAP  Never done   COVID-19 Vaccine (4 - Booster for Moderna series) 10/02/2020   INFLUENZA VACCINE  03/29/2021   MAMMOGRAM  08/06/2021   DEXA SCAN  Completed   Hepatitis C Screening  Completed   Zoster Vaccines- Shingrix  Completed   HPV VACCINES  Aged Out    Health Maintenance  Health Maintenance Due  Topic Date Due   TETANUS/TDAP  Never done   COVID-19 Vaccine (4 - Booster for Moderna series) 10/02/2020   INFLUENZA VACCINE  03/29/2021    Colorectal cancer screening: No longer required.   Mammogram status: Completed 08/2019 per patient. Repeat every year  Bone density status: per patient was done at Dr. Ophelia Charter office  Lung Cancer Screening: (Low Dose CT Chest recommended if Age 39-80 years, 30 pack-year currently smoking OR have quit w/in 15years.) does not qualify.   Lung Cancer Screening Referral: no  Additional Screening:  Hepatitis C Screening: does qualify; Completed no  Vision Screening: Recommended annual ophthalmology exams for early detection of glaucoma and other disorders of the eye. Is the patient up to date with their annual eye exam?  Yes  Who is the provider or what is the name of the office in which the patient attends annual eye exams? Luberta Mutter, MD. If pt is not established with a provider, would they like to be referred to a provider to establish care? No .   Dental Screening: Recommended annual dental exams for proper oral hygiene  Community Resource Referral / Chronic Care Management: CRR required this visit?  No   CCM required this visit?  No      Plan:     I have personally reviewed and noted the following in the patient's chart:   Medical and social history Use of alcohol, tobacco or illicit drugs  Current medications and supplements including opioid  prescriptions.  Functional ability and status Nutritional status Physical activity Advanced directives List of other physicians Hospitalizations, surgeries, and ER visits in previous 12 months Vitals Screenings to include cognitive, depression, and falls Referrals and appointments  In addition, I have reviewed and discussed with patient certain preventive protocols, quality metrics, and best practice recommendations. A written personalized care plan for preventive services as well as general preventive health recommendations were provided to patient.     Sheral Flow, LPN   [id number]/04/[id number]   Nurse Notes:  Patient is cogitatively intact. There were no vitals filed for this visit. There is no height or weight on file to calculate BMI. Patient stated that she has no issues with gait or balance; does not use any assistive devices. Medications reviewed with patient; no opioid use noted. Hearing Screening - Comments:: Patient denied any hearing difficulty.   No hearing aids.  Vision Screening - Comments:: Patient wears corrective glasses/contacts.  Eye exam done annually by: Luberta Mutter, MD.

## 2021-06-04 MED ORDER — CEPHALEXIN 500 MG PO CAPS
500.0000 mg | ORAL_CAPSULE | Freq: Two times a day (BID) | ORAL | 0 refills | Status: AC
Start: 2021-06-04 — End: 2021-06-09

## 2021-06-11 DIAGNOSIS — Z96651 Presence of right artificial knee joint: Secondary | ICD-10-CM | POA: Diagnosis not present

## 2021-06-17 ENCOUNTER — Other Ambulatory Visit: Payer: Self-pay

## 2021-06-17 ENCOUNTER — Ambulatory Visit: Payer: PPO | Admitting: Podiatry

## 2021-06-17 DIAGNOSIS — D2371 Other benign neoplasm of skin of right lower limb, including hip: Secondary | ICD-10-CM | POA: Diagnosis not present

## 2021-06-17 NOTE — Progress Notes (Signed)
She presents today chief complaint of a painful lesion to the plantar aspect of the hallux right.  States is been bothering her now for the past few weeks.  Objective: Vital signs are stable she is alert and oriented x3 pulses are palpable.  She has limitation on range of motion of the first metatarsophalangeal joint of the right foot and hyperextension at the interphalangeal joint of the hallux.  Plantar aspect of the head of the proximal phalanx of the hallux does demonstrate a reactive hyperkeratotic lesion which does not demonstrate verrucoid characteristics.  There are no thrombosed capillaries visible.  Assessment: Benign skin lesion hallux right.  Plan: Debridement of benign skin lesion placed silicone padding we will follow-up with her on an as-needed basis.

## 2021-06-22 ENCOUNTER — Encounter: Payer: Self-pay | Admitting: Internal Medicine

## 2021-06-22 MED ORDER — FLUCONAZOLE 150 MG PO TABS
150.0000 mg | ORAL_TABLET | Freq: Once | ORAL | 0 refills | Status: AC
Start: 2021-06-22 — End: 2021-06-22

## 2021-06-28 ENCOUNTER — Telehealth: Payer: Self-pay | Admitting: Gastroenterology

## 2021-06-28 ENCOUNTER — Other Ambulatory Visit: Payer: Self-pay

## 2021-06-28 MED ORDER — COLESTIPOL HCL 1 G PO TABS: 1.0000 g | ORAL_TABLET | Freq: Two times a day (BID) | ORAL | 0 refills | Status: DC

## 2021-06-28 NOTE — Telephone Encounter (Signed)
Outpatient Medication Detail   Disp Refills Start End   colestipol (COLESTID) 1 g tablet 60 tablet 0 06/28/2021    Sig - Route: Take 1 tablet (1 g total) by mouth 2 (two) times daily. - Oral   Sent to pharmacy as: colestipol (COLESTID) 1 g tablet   E-Prescribing Status: Receipt confirmed by pharmacy (06/28/2021  1:58 PM EDT)

## 2021-06-28 NOTE — Telephone Encounter (Signed)
Patient is requesting a refill on Colestipol.

## 2021-07-15 ENCOUNTER — Encounter: Payer: Self-pay | Admitting: Internal Medicine

## 2021-07-16 ENCOUNTER — Encounter: Payer: Self-pay | Admitting: Internal Medicine

## 2021-07-26 ENCOUNTER — Other Ambulatory Visit: Payer: Self-pay | Admitting: Gastroenterology

## 2021-07-27 ENCOUNTER — Encounter: Payer: Self-pay | Admitting: Internal Medicine

## 2021-07-27 ENCOUNTER — Ambulatory Visit (INDEPENDENT_AMBULATORY_CARE_PROVIDER_SITE_OTHER): Payer: PPO | Admitting: Internal Medicine

## 2021-07-27 ENCOUNTER — Other Ambulatory Visit: Payer: Self-pay

## 2021-07-27 DIAGNOSIS — M545 Low back pain, unspecified: Secondary | ICD-10-CM

## 2021-07-27 NOTE — Progress Notes (Signed)
   Subjective:   Patient ID: Amanda Curry, female    DOB: 1950/11/22, 70 y.o.   MRN: [id number]  HPI The patient is a 70 YO female coming in for low back pain.  Review of Systems  Constitutional:  Positive for activity change. Negative for appetite change, chills, fatigue, fever and unexpected weight change.  Respiratory: Negative.    Cardiovascular: Negative.   Gastrointestinal: Negative.   Musculoskeletal:  Positive for arthralgias, back pain and myalgias. Negative for gait problem and joint swelling.  Skin: Negative.   Neurological: Negative.    Objective:  Physical Exam Constitutional:      Appearance: She is well-developed.  HENT:     Head: Normocephalic and atraumatic.  Cardiovascular:     Rate and Rhythm: Normal rate and regular rhythm.  Pulmonary:     Effort: Pulmonary effort is normal. No respiratory distress.     Breath sounds: Normal breath sounds. No wheezing or rales.  Abdominal:     General: Bowel sounds are normal. There is no distension.     Palpations: Abdomen is soft.     Tenderness: There is no abdominal tenderness. There is no rebound.  Musculoskeletal:     Cervical back: Normal range of motion.  Skin:    General: Skin is warm and dry.  Neurological:     Mental Status: She is alert and oriented to person, place, and time.     Coordination: Coordination normal.    Vitals:   07/27/21 0939  BP: 128/90  Pulse: 98  Resp: 18  SpO2: 99%  Weight: 131 lb 12.8 oz (59.8 kg)  Height: 5' 1.25" (1.556 m)    This visit occurred during the SARS-CoV-2 public health emergency.  Safety protocols were in place, including screening questions prior to the visit, additional usage of staff PPE, and extensive cleaning of exam room while observing appropriate contact time as indicated for disinfecting solutions.   Assessment & Plan:  Visit time 20 minutes in face to face communication with patient and coordination of care, additional 5 minutes spent in record review,  coordination or care, ordering tests, communicating/referring to other healthcare professionals, documenting in medical records all on the same day of the visit for total time 25 minutes spent on the visit.

## 2021-07-28 ENCOUNTER — Encounter: Payer: Self-pay | Admitting: Internal Medicine

## 2021-07-28 ENCOUNTER — Ambulatory Visit: Payer: PPO | Admitting: Gastroenterology

## 2021-07-28 ENCOUNTER — Encounter: Payer: Self-pay | Admitting: Gastroenterology

## 2021-07-28 VITALS — BP 140/90 | HR 91 | Ht 61.5 in | Wt 132.0 lb

## 2021-07-28 DIAGNOSIS — M545 Low back pain, unspecified: Secondary | ICD-10-CM | POA: Insufficient documentation

## 2021-07-28 DIAGNOSIS — R197 Diarrhea, unspecified: Secondary | ICD-10-CM

## 2021-07-28 MED ORDER — COLESTIPOL HCL 1 G PO TABS: ORAL_TABLET | ORAL | 3 refills | Status: DC

## 2021-07-28 NOTE — Patient Instructions (Addendum)
I did not change your medications today.  We have sent the following medications to your pharmacy for you to pick up at your convenience: colestid.   I'd like to see you at least annually.   Please let me know if you have any questions or concerns since that time.

## 2021-07-28 NOTE — Progress Notes (Signed)
Referring Provider: Hoyt Koch, * Primary Care Physician:  Hoyt Koch, MD   Chief complaint:  diarrhea   IMPRESSION:  Diarrhea, improved on Metamucil and colestipol    - random colon biopsies negative for microscopic colitis    - ESR, CRP, fecal calprotectin, stool cultures normal Elevated TSH 2019, normal 2020 History of colon polyps    - polypoid mucosa on colonoscopy 2009    - 41m descending colon tubular adenoma on colonoscopy 2016    - no polyps on colonoscopy 10/2019    - consider colonoscopy 2032 if clinically appropriate at that time Descending colon diverticulosis Family history of colon cancer (maternal grandmother)  Continue bulking agent and bile acid binding agent has provided symptomatic relief. No further work-up needed at this time.   PLAN: - No medication changes at this time - Continue Metamucil BID and colestipol 1 g BID (90 day with 3 refills) - Annual follow-up, earlier if needed  I spent 20  minutes, including independent review of results, face-to-face time with the patient, coordinating care, ordering medications, and documentation.     HPI: Amanda Curry a 70y.o. female who returns in follow-up for her diarrhea.  She was previously followed by Dr. BMaurene Capesfor a diagnosis of IBS. She had a colonoscopy in 2009 that showed colon polyps but the pathology reports described polypoid mucosa. Patient was seen in July 2014 for acute diarrheal illness which responded to Flagyl. Records from 2016 note occasional gas and bloating and concerns for a recurrent infection similar to 2014. Colonoscopy with Dr. BMaurene Capes2/23/2016 showed moderate diverticulosis in the descending colon and a 48mdescending colon tubular adenoma. There is a family history of colon cancer in her grandmother.  Seen in consultation 06/11/18 for the acute onset of watery, explosive diarrhea.  In 05/2018 she had a normal CRP, ESR, giardia, fecal calprotectin, and stool culture.   TSH was elevated as high as 9 in May 2019. But normal January 2020. Colonoscopy ultimately performed 11/20/2019 and showed pancolonic diverticulosis that was most severe in the left colon and internal hemorrhoids.  Right-sided and left-sided random colon biopsies were normal.  She is having a bowel movement once to twice daily on Colestid and metamucil. No loose stools, urgency or accidents. Rare hard stools. No blood or mucous. No straining. Sense of complete evacuation with every bowel movement. No rectal pain or irritation. Weight is overall stable. Appetite is good.   No new complaints or concerns.   Past Medical History:  Diagnosis Date   Adenomatous polyps    Arthritis    Diverticulosis    Ganglion of left wrist    thumb joint   GERD (gastroesophageal reflux disease)    Hyperlipidemia    Hypertension    Pre-diabetes    Tinnitus     Past Surgical History:  Procedure Laterality Date   ABDOMINAL HYSTERECTOMY N/A    Phreesia 02/23/2020   ANTERIOR AND POSTERIOR REPAIR WITH SACROSPINOUS FIXATION N/A 01/20/2020   Procedure: ANTERIOR AND POSTERIOR REPAIR WITH SACROSPINOUS FIXATION;  Surgeon: McArvella NighMD;  [facility];  Laterality: N/A;   COLONOSCOPY     x 3   CYSTOSCOPY N/A 01/20/2020   Procedure: CYSTOSCOPY;  Surgeon: McArvella NighMD;  [facility];  Laterality: N/A;   JOINT REPLACEMENT Right 05/2019   LAPAROSCOPIC VAGINAL HYSTERECTOMY WITH SALPINGO OOPHORECTOMY Bilateral 01/20/2020   Procedure: LAPAROSCOPIC ASSISTED VAGINAL HYSTERECTOMY WITH SALPINGO  OOPHORECTOMY;  Surgeon: [name], MD;  Location: [facility];  Service: Gynecology;  Laterality: Bilateral;  NEED BED   POLYPECTOMY     right trigger finger correction surgery     TONSILLECTOMY     TOTAL KNEE ARTHROPLASTY Right 06/24/2019   Procedure: TOTAL KNEE ARTHROPLASTY;  Surgeon: Gaynelle Arabian, MD;  Location: [facility];  Service: Orthopedics;  Laterality: Right;  67mn   TUBAL LIGATION      Current Outpatient Medications  Medication Sig Dispense Refill   estradiol (ESTRACE) 0.1 MG/GM vaginal cream      hydrocortisone cream 1 % Apply 1 application topically daily as needed for itching.      melatonin 5 MG TABS Take 5 mg by mouth at bedtime.     Pitavastatin Calcium (LIVALO) 2 MG TABS TAKE 1/2 TABLET(1 MG) BY MOUTH DAILY 45 tablet 3   pseudoephedrine (SUDAFED) 30 MG tablet Take 30 mg by mouth every 4 (four) hours as needed for congestion.     psyllium (METAMUCIL) 58.6 % powder Take 1 packet by mouth 3 (three) times daily.     simethicone (MYLICON) 80 MG chewable tablet Chew 80 mg by mouth every 6 (six) hours as needed for flatulence.     spironolactone (ALDACTONE) 50 MG tablet Take 1 tablet (50 mg total) by mouth daily. 90 tablet 3   triamcinolone cream (KENALOG) 0.1 % Apply 1 application topically daily as needed (eczema). 30 g 1   colestipol (COLESTID) 1 g tablet TAKE 1 TABLET(1 GRAM) BY MOUTH TWICE DAILY 180 tablet 3   No current facility-administered medications for this visit.    Allergies as of 07/28/2021 - Review Complete 07/28/2021  Allergen Reaction Noted   Hctz [hydrochlorothiazide] Other (See Comments) 10/22/2015   Epinephrine Other (See Comments) 11/06/2019   Novocain [procaine]  06/24/2019   Olmesartan  12/08/2020   Amlodipine Swelling 01/26/2018   Other  02/23/2020    Family History  Problem Relation Age of Onset   Colon cancer Maternal Grandmother    Cancer Maternal Grandmother        colon   Prostate cancer Father    Cancer Father        prostate   Alzheimer's disease Mother    Hyperlipidemia Mother    Hypertension Mother    Heart disease Paternal Grandmother    Heart disease Paternal Grandfather    Hypertension Sister    Colon polyps Neg Hx    Esophageal cancer Neg Hx    Rectal cancer Neg Hx    Stomach cancer Neg Hx       General:   Alert,  Well-developed,  well-nourished, pleasant and cooperative in NAD Head:  Normocephalic and atraumatic. Sclera anicteric.  Mouth:  No deformity or lesions.   Neck:  Supple; no masses or thyromegaly. Abdomen:  Soft, nontender, BS active,nonpalp mass or hsm.   Extremities:  No edema. Neurologic:  Alert and  oriented x4;  grossly normal neurologically. Skin:  Intact without significant lesions or rashes.. Psych:  Alert and cooperative. Normal mood and affect.  KThornton Park MD, MPH [facility]

## 2021-07-28 NOTE — Assessment & Plan Note (Signed)
Resolving on its own. She is doing stretching exercises and reinforced this and to keep up with stretching long term to prevent recurrence.

## 2021-08-31 ENCOUNTER — Encounter: Payer: Self-pay | Admitting: Gastroenterology

## 2021-09-01 ENCOUNTER — Other Ambulatory Visit: Payer: Self-pay

## 2021-09-01 NOTE — Progress Notes (Signed)
error 

## 2021-10-04 ENCOUNTER — Encounter: Payer: Self-pay | Admitting: Internal Medicine

## 2021-10-04 DIAGNOSIS — E2839 Other primary ovarian failure: Secondary | ICD-10-CM

## 2021-10-04 DIAGNOSIS — Z1231 Encounter for screening mammogram for malignant neoplasm of breast: Secondary | ICD-10-CM

## 2021-10-19 DIAGNOSIS — L578 Other skin changes due to chronic exposure to nonionizing radiation: Secondary | ICD-10-CM | POA: Diagnosis not present

## 2021-10-19 DIAGNOSIS — L821 Other seborrheic keratosis: Secondary | ICD-10-CM | POA: Diagnosis not present

## 2021-10-19 DIAGNOSIS — L814 Other melanin hyperpigmentation: Secondary | ICD-10-CM | POA: Diagnosis not present

## 2021-10-19 DIAGNOSIS — D2271 Melanocytic nevi of right lower limb, including hip: Secondary | ICD-10-CM | POA: Diagnosis not present

## 2021-10-19 DIAGNOSIS — Z85828 Personal history of other malignant neoplasm of skin: Secondary | ICD-10-CM | POA: Diagnosis not present

## 2021-10-19 DIAGNOSIS — Z23 Encounter for immunization: Secondary | ICD-10-CM | POA: Diagnosis not present

## 2021-10-19 DIAGNOSIS — D225 Melanocytic nevi of trunk: Secondary | ICD-10-CM | POA: Diagnosis not present

## 2021-10-19 DIAGNOSIS — L570 Actinic keratosis: Secondary | ICD-10-CM | POA: Diagnosis not present

## 2021-10-21 ENCOUNTER — Telehealth: Payer: Self-pay | Admitting: *Deleted

## 2021-10-21 ENCOUNTER — Other Ambulatory Visit: Payer: Self-pay | Admitting: Nurse Practitioner

## 2021-10-21 DIAGNOSIS — Z1231 Encounter for screening mammogram for malignant neoplasm of breast: Secondary | ICD-10-CM

## 2021-10-21 DIAGNOSIS — Z13820 Encounter for screening for osteoporosis: Secondary | ICD-10-CM

## 2021-10-21 DIAGNOSIS — E2839 Other primary ovarian failure: Secondary | ICD-10-CM

## 2021-10-21 NOTE — Telephone Encounter (Signed)
Patient is scheduled tomorrow with [facility] mammography a referral/order needs to be faxed to [facility] for the patient bone density  Please advise. Thank you   AttnEbony Hail  Fax: [phone]

## 2021-10-21 NOTE — Telephone Encounter (Signed)
Fax was sent and confirmation received it was successful.

## 2021-10-22 DIAGNOSIS — M85852 Other specified disorders of bone density and structure, left thigh: Secondary | ICD-10-CM | POA: Diagnosis not present

## 2021-10-22 DIAGNOSIS — M85851 Other specified disorders of bone density and structure, right thigh: Secondary | ICD-10-CM | POA: Diagnosis not present

## 2021-10-22 DIAGNOSIS — Z780 Asymptomatic menopausal state: Secondary | ICD-10-CM | POA: Diagnosis not present

## 2021-10-22 DIAGNOSIS — Z1231 Encounter for screening mammogram for malignant neoplasm of breast: Secondary | ICD-10-CM | POA: Diagnosis not present

## 2021-10-22 LAB — HM MAMMOGRAPHY

## 2021-10-22 LAB — HM DEXA SCAN

## 2022-01-20 ENCOUNTER — Ambulatory Visit: Payer: PPO | Admitting: Podiatry

## 2022-01-20 DIAGNOSIS — D2371 Other benign neoplasm of skin of right lower limb, including hip: Secondary | ICD-10-CM | POA: Diagnosis not present

## 2022-01-23 NOTE — Progress Notes (Signed)
She presents today for follow-up of painful lesion plantar aspect of the hallux right.  States that it is growing back.  Objective: Vital signs are stable she is alert and oriented x3 there is no erythema edema cellulitis drainage or odor pulses are palpable.  Reactive hyperkeratotic lesion plantar aspect of the hallux interphalangeal joint of the right foot.  Assessment: Benign skin lesion plantar aspect hallux.  Plan: Sharply debrided and enucleated today the lesion to the plantar hallux.  No signs of infection beneath the skin.  Please silicone padding follow-up with me on an as-needed basis.

## 2022-02-03 ENCOUNTER — Other Ambulatory Visit: Payer: Self-pay | Admitting: Internal Medicine

## 2022-02-03 DIAGNOSIS — E785 Hyperlipidemia, unspecified: Secondary | ICD-10-CM

## 2022-02-03 DIAGNOSIS — L570 Actinic keratosis: Secondary | ICD-10-CM | POA: Diagnosis not present

## 2022-02-03 DIAGNOSIS — L820 Inflamed seborrheic keratosis: Secondary | ICD-10-CM | POA: Diagnosis not present

## 2022-03-23 DIAGNOSIS — H52203 Unspecified astigmatism, bilateral: Secondary | ICD-10-CM | POA: Diagnosis not present

## 2022-03-23 DIAGNOSIS — H2513 Age-related nuclear cataract, bilateral: Secondary | ICD-10-CM | POA: Diagnosis not present

## 2022-04-13 DIAGNOSIS — L821 Other seborrheic keratosis: Secondary | ICD-10-CM | POA: Diagnosis not present

## 2022-04-14 ENCOUNTER — Other Ambulatory Visit: Payer: Self-pay | Admitting: Internal Medicine

## 2022-04-14 DIAGNOSIS — I10 Essential (primary) hypertension: Secondary | ICD-10-CM

## 2022-04-18 ENCOUNTER — Encounter: Payer: Self-pay | Admitting: Internal Medicine

## 2022-04-18 DIAGNOSIS — I10 Essential (primary) hypertension: Secondary | ICD-10-CM

## 2022-04-20 MED ORDER — SPIRONOLACTONE 50 MG PO TABS: 50.0000 mg | ORAL_TABLET | Freq: Every day | ORAL | 1 refills | Status: DC

## 2022-06-03 ENCOUNTER — Telehealth: Payer: Self-pay | Admitting: Internal Medicine

## 2022-06-03 NOTE — Telephone Encounter (Signed)
N/A unable to lave a message for patient to call back to schedule Medicare Annual Wellness Visit   Last AWV  06/03/21  Please schedule at anytime with [facility] if patient calls the office back.     Any questions, please call me at [phone]

## 2022-06-06 ENCOUNTER — Telehealth: Payer: Self-pay | Admitting: Internal Medicine

## 2022-06-06 NOTE — Telephone Encounter (Signed)
LVM for pt to rtn my call to schedule AWV with [facility] call back # [phone] 

## 2022-06-16 ENCOUNTER — Encounter: Payer: Self-pay | Admitting: Internal Medicine

## 2022-06-16 ENCOUNTER — Ambulatory Visit (INDEPENDENT_AMBULATORY_CARE_PROVIDER_SITE_OTHER): Payer: PPO | Admitting: Internal Medicine

## 2022-06-16 VITALS — BP 138/84 | HR 84 | Temp 98.5°F | Ht 61.0 in | Wt 131.0 lb

## 2022-06-16 DIAGNOSIS — R7303 Prediabetes: Secondary | ICD-10-CM

## 2022-06-16 DIAGNOSIS — Z0001 Encounter for general adult medical examination with abnormal findings: Secondary | ICD-10-CM | POA: Diagnosis not present

## 2022-06-16 DIAGNOSIS — I10 Essential (primary) hypertension: Secondary | ICD-10-CM | POA: Diagnosis not present

## 2022-06-16 DIAGNOSIS — Z23 Encounter for immunization: Secondary | ICD-10-CM | POA: Diagnosis not present

## 2022-06-16 DIAGNOSIS — E782 Mixed hyperlipidemia: Secondary | ICD-10-CM | POA: Diagnosis not present

## 2022-06-16 LAB — HEMOGLOBIN A1C: Hgb A1c MFr Bld: 6.2 % (ref 4.6–6.5)

## 2022-06-16 LAB — CBC
HCT: 36.2 % (ref 36.0–46.0)
Hemoglobin: 12.3 g/dL (ref 12.0–15.0)
MCHC: 34 g/dL (ref 30.0–36.0)
MCV: 89.4 fl (ref 78.0–100.0)
Platelets: 361 10*3/uL (ref 150.0–400.0)
RBC: 4.05 Mil/uL (ref 3.87–5.11)
RDW: 12.5 % (ref 11.5–15.5)
WBC: 7.9 10*3/uL (ref 4.0–10.5)

## 2022-06-16 LAB — COMPREHENSIVE METABOLIC PANEL
ALT: 16 U/L (ref 0–35)
AST: 22 U/L (ref 0–37)
Albumin: 4.3 g/dL (ref 3.5–5.2)
Alkaline Phosphatase: 63 U/L (ref 39–117)
BUN: 20 mg/dL (ref 6–23)
CO2: 27 mEq/L (ref 19–32)
Calcium: 9.5 mg/dL (ref 8.4–10.5)
Chloride: 94 mEq/L — ABNORMAL LOW (ref 96–112)
Creatinine, Ser: 0.92 mg/dL (ref 0.40–1.20)
GFR: 61.7 mL/min (ref >60.00)
Glucose, Bld: 107 mg/dL — ABNORMAL HIGH (ref 70–99)
Potassium: 4.5 mEq/L (ref 3.5–5.1)
Sodium: 127 mEq/L — ABNORMAL LOW (ref 135–145)
Total Bilirubin: 0.4 mg/dL (ref 0.2–1.2)
Total Protein: 7 g/dL (ref 6.0–8.3)

## 2022-06-16 LAB — LIPID PANEL
Cholesterol: 179 mg/dL (ref 0–200)
HDL: 67.2 mg/dL (ref >39.00)
LDL Cholesterol: 90 mg/dL (ref 0–99)
NonHDL: 111.96
Total CHOL/HDL Ratio: 3
Triglycerides: 108 mg/dL (ref 0.0–149.0)
VLDL: 21.6 mg/dL (ref 0.0–40.0)

## 2022-06-16 MED ORDER — FAMOTIDINE 40 MG PO TABS: 40.0000 mg | ORAL_TABLET | Freq: Every day | ORAL | 3 refills | Status: DC

## 2022-06-16 NOTE — Assessment & Plan Note (Signed)
Ordered HgA1c to assess.

## 2022-06-16 NOTE — Assessment & Plan Note (Signed)
Flu shot given. Covid-19 counseled. Pneumonia complete. Shingrix complete. Tetanus due at pharmacy. Colonoscopy aged out. Mammogram getting records from [facility], pap smear aged out and dexa getting records from [place]. Counseled about sun safety and mole surveillance. Counseled about the dangers of distracted driving. Given 10 year screening recommendations.

## 2022-06-16 NOTE — Progress Notes (Signed)
Subjective:   Patient ID: Amanda Curry, female    DOB: March 15, 1951, 71 y.o.   MRN: [id number]  HPI Here for medicare wellness and physical, no new complaints. Please see A/P for status and treatment of chronic medical problems.   Diet: heart healthy Physical activity: exercises 5 days/week Depression/mood screen: negative Hearing: intact to whispered voice Visual acuity: grossly normal, performs annual eye exam  ADLs: capable Fall risk: none Home safety: good Cognitive evaluation: intact to orientation, naming, recall and repetition EOL planning: adv directives discussed  [place] Office Visit from 06/16/2022 in [place] at [facility]  PHQ-2 Total Score [place] Office Visit from 06/16/2022 in [place] at [facility]  PHQ-9 Total Score 0         02/26/2020   10:08 AM 04/28/2020    3:45 PM 09/10/2020   10:31 AM 06/03/2021    3:07 PM 06/16/2022    8:27 AM  [place] in the past year? 0 0 0 0 0  Was there an injury with Fall? 0 0 0 0 0  Fall Risk Category Calculator 0 0 0 0 0  Fall Risk Category Low Low Low Low Low  Patient Fall Risk Level Low fall risk Low fall risk Low fall risk Low fall risk Low fall risk  Patient at Risk for Falls Due to  No Fall Risks  No Fall Risks No Fall Risks  Fall risk Follow up Falls evaluation completed;Education provided Falls evaluation completed Falls evaluation completed Falls evaluation completed Falls evaluation completed    I have personally reviewed and have noted 1. The patient's medical and social history - reviewed today no changes 2. Their use of alcohol, tobacco or illicit drugs 3. Their current medications and supplements 4. The patient's functional ability including ADL's, fall risks, home safety risks and hearing or visual impairment. 5. Diet and physical activities 6. Evidence for depression or mood disorders 7. Care team reviewed and updated 8.  The patient is not on an  opioid pain medication.  Patient Care Team: Hoyt Koch, MD as PCP - General (Internal Medicine) [name], MD as Consulting Physician (Obstetrics and Gynecology) Pedro Earls, MD as Attending Physician (Family Medicine) Luberta Mutter, MD as Consulting Physician (Ophthalmology) Past Medical History:  Diagnosis Date   Adenomatous polyps    Arthritis    Diverticulosis    Ganglion of left wrist    thumb joint   GERD (gastroesophageal reflux disease)    Hyperlipidemia    Hypertension    Pre-diabetes    Tinnitus    Past Surgical History:  Procedure Laterality Date   ABDOMINAL HYSTERECTOMY N/A    Phreesia 02/23/2020   ANTERIOR AND POSTERIOR REPAIR WITH SACROSPINOUS FIXATION N/A 01/20/2020   Procedure: ANTERIOR AND POSTERIOR REPAIR WITH SACROSPINOUS FIXATION;  Surgeon: [name], MD;  Location: [place];  Service: Gynecology;  Laterality: N/A;   COLONOSCOPY     x 3   CYSTOSCOPY N/A 01/20/2020   Procedure: CYSTOSCOPY;  Surgeon: [name], MD;  [facility];  Service: Gynecology;  Laterality: N/A;   JOINT REPLACEMENT Right 05/2019   LAPAROSCOPIC VAGINAL HYSTERECTOMY WITH SALPINGO OOPHORECTOMY Bilateral 01/20/2020   Procedure: LAPAROSCOPIC ASSISTED VAGINAL HYSTERECTOMY WITH SALPINGO OOPHORECTOMY;  Surgeon: [name], MD;  Location: [place];  Service: Gynecology;  Laterality: Bilateral;  NEED BED   POLYPECTOMY     right trigger finger correction surgery  TONSILLECTOMY     TOTAL KNEE ARTHROPLASTY Right 06/24/2019   Procedure: TOTAL KNEE ARTHROPLASTY;  Surgeon: Gaynelle Arabian, MD;  Location: [facility];  Service: Orthopedics;  Laterality: Right;  36mn   TUBAL LIGATION     Family History  Problem Relation Age of Onset   Colon cancer Maternal Grandmother    Cancer Maternal Grandmother        colon   Prostate cancer Father    Cancer Father        prostate   Alzheimer's disease Mother     Hyperlipidemia Mother    Hypertension Mother    Heart disease Paternal Grandmother    Heart disease Paternal Grandfather    Hypertension Sister    Colon polyps Neg Hx    Esophageal cancer Neg Hx    Rectal cancer Neg Hx    Stomach cancer Neg Hx    Review of Systems  Constitutional: Negative.   HENT: Negative.    Eyes: Negative.   Respiratory:  Negative for cough, chest tightness and shortness of breath.   Cardiovascular:  Negative for chest pain, palpitations and leg swelling.  Gastrointestinal:  Negative for abdominal distention, abdominal pain, constipation, diarrhea, nausea and vomiting.  Musculoskeletal: Negative.   Skin: Negative.   Neurological: Negative.   Psychiatric/Behavioral: Negative.      Objective:  Physical Exam Constitutional:      Appearance: She is well-developed.  HENT:     Head: Normocephalic and atraumatic.  Cardiovascular:     Rate and Rhythm: Normal rate and regular rhythm.  Pulmonary:     Effort: Pulmonary effort is normal. No respiratory distress.     Breath sounds: Normal breath sounds. No wheezing or rales.  Abdominal:     General: Bowel sounds are normal. There is no distension.     Palpations: Abdomen is soft.     Tenderness: There is no abdominal tenderness. There is no rebound.  Musculoskeletal:     Cervical back: Normal range of motion.  Skin:    General: Skin is warm and dry.  Neurological:     Mental Status: She is alert and oriented to person, place, and time.     Coordination: Coordination normal.     Vitals:   06/16/22 0828 06/16/22 0855  BP: (!) 138/96 138/84  Pulse: 84   Temp: 98.5 F (36.9 C)   TempSrc: Oral   SpO2: 97%   Weight: 131 lb (59.4 kg)   Height: '5\' 1"'$  (1.549 m)     Assessment & Plan:  Flu shot given at visit

## 2022-06-16 NOTE — Assessment & Plan Note (Signed)
BP at goal on spironolactone 50 mg daily. Checking CMP CBC and lipid panel. Adjust as needed.

## 2022-06-16 NOTE — Assessment & Plan Note (Signed)
Checking lipid panel and adjust livalo 1 mg daily as needed.

## 2022-06-16 NOTE — Patient Instructions (Signed)
We will check the labs and given you the flu shot today.

## 2022-06-17 ENCOUNTER — Ambulatory Visit (INDEPENDENT_AMBULATORY_CARE_PROVIDER_SITE_OTHER): Payer: PPO

## 2022-06-17 ENCOUNTER — Encounter: Payer: Self-pay | Admitting: Internal Medicine

## 2022-06-17 VITALS — Ht 61.0 in | Wt 130.0 lb

## 2022-06-17 DIAGNOSIS — Z0000 Encounter for general adult medical examination without abnormal findings: Secondary | ICD-10-CM | POA: Diagnosis not present

## 2022-06-17 NOTE — Progress Notes (Signed)
Subjective:   Amanda Curry is a 71 y.o. female who presents for Medicare Annual (Subsequent) preventive examination.   I connected with  [name] on 06/17/22 by a audio enabled telemedicine application and verified that I am speaking with the correct person using two identifiers.  Patient Location: Home  Provider Location: Home Office  I discussed the limitations of evaluation and management by telemedicine. The patient expressed understanding and agreed to proceed.  Review of Systems     Cardiac Risk Factors include: advanced age (>62mn, >>68women);hypertension     Objective:    Today's Vitals   06/17/22 0922  Weight: 130 lb (59 kg)  Height: '5\' 1"'$  (1.549 m)   Body mass index is 24.56 kg/m.     06/17/2022    9:27 AM 06/03/2021    3:05 PM 02/26/2020   10:06 AM 01/27/2020   10:05 PM 01/20/2020    6:04 AM 01/10/2020   10:21 AM 06/24/2019    6:30 PM  Advanced Directives  Does Patient Have a Medical Advance Directive? Yes Yes Yes Yes Yes Yes Yes  Type of AParamedicof [place] will Living will;Healthcare Power of Attorney Living will;Healthcare Power of [place]ather[place] will [place] Post[place] will Living will;Healthcare Power of Attorney  Does patient want to make changes to medical advance directive?  No - Patient declined Yes (ED - Information included in AVS) Yes (ED - Information included in AVS) No - Patient declined  No - Patient declined  Copy of H[place]nd[place] Chart? No - copy requested No - copy requested No - copy requested No - copy requested No - copy requested  No - copy requested  Would patient like information on creating a medical advance directive?    Yes (ED - Information included in AVS)       Current Medications (verified) Outpatient Encounter Medications as of 06/17/2022  Medication Sig   colestipol (COLESTID) 1 g tablet TAKE 1  TABLET(1 GRAM) BY MOUTH TWICE DAILY   estradiol (ESTRACE) 0.1 MG/GM vaginal cream    famotidine (PEPCID) 40 MG tablet Take 1 tablet (40 mg total) by mouth at bedtime.   hydrocortisone cream 1 % Apply 1 application topically daily as needed for itching.    LIVALO 2 MG TABS TAKE 1/2 TABLET(1 MG) BY MOUTH DAILY   melatonin 5 MG TABS Take 5 mg by mouth at bedtime.   pseudoephedrine (SUDAFED) 30 MG tablet Take 30 mg by mouth every 4 (four) hours as needed for congestion.   psyllium (METAMUCIL) 58.6 % powder Take 1 packet by mouth 3 (three) times daily.   simethicone (MYLICON) 80 MG chewable tablet Chew 80 mg by mouth every 6 (six) hours as needed for flatulence.   spironolactone (ALDACTONE) 50 MG tablet Take 1 tablet (50 mg total) by mouth daily.   triamcinolone cream (KENALOG) 0.1 % Apply 1 application topically daily as needed (eczema).   No facility-administered encounter medications on file as of 06/17/2022.    Allergies (verified) Hctz [hydrochlorothiazide], Epinephrine, Novocain [procaine], Olmesartan, Amlodipine, Lidocaine-epinephrine, and Other   History: Past Medical History:  Diagnosis Date   Adenomatous polyps    Arthritis    Diverticulosis    Ganglion of left wrist    thumb joint   GERD (gastroesophageal reflux disease)    Hyperlipidemia    Hypertension    Pre-diabetes    Tinnitus    Past Surgical History:  Procedure Laterality Date   ABDOMINAL HYSTERECTOMY N/A    Phreesia 02/23/2020   ANTERIOR AND POSTERIOR REPAIR WITH SACROSPINOUS FIXATION N/A 01/20/2020   Procedure: ANTERIOR AND POSTERIOR REPAIR WITH SACROSPINOUS FIXATION;  Surgeon: [name], MD;  Location: [facility];  Service: Gynecology;  Laterality: N/A;   COLONOSCOPY     x 3   CYSTOSCOPY N/A 01/20/2020   Procedure: CYSTOSCOPY;  Surgeon: [name], MD;  Location: [facility];  Service: Gynecology;  Laterality: N/A;   JOINT REPLACEMENT Right 05/2019   LAPAROSCOPIC VAGINAL  HYSTERECTOMY WITH SALPINGO OOPHORECTOMY Bilateral 01/20/2020   Procedure: LAPAROSCOPIC ASSISTED VAGINAL HYSTERECTOMY WITH SALPINGO OOPHORECTOMY;  Surgeon: [name], MD;  Location: [facility];  Service: Gynecology;  Laterality: Bilateral;  NEED BED   POLYPECTOMY     right trigger finger correction surgery     TONSILLECTOMY     TOTAL KNEE ARTHROPLASTY Right 06/24/2019   Procedure: TOTAL KNEE ARTHROPLASTY;  Surgeon: Gaynelle Arabian, MD;  Location: [facility];  Service: Orthopedics;  Laterality: Right;  84mn   TUBAL LIGATION     Family History  Problem Relation Age of Onset   Colon cancer Maternal Grandmother    Cancer Maternal Grandmother        colon   Prostate cancer Father    Cancer Father        prostate   Alzheimer's disease Mother    Hyperlipidemia Mother    Hypertension Mother    Heart disease Paternal Grandmother    Heart disease Paternal Grandfather    Hypertension Sister    Colon polyps Neg Hx    Esophageal cancer Neg Hx    Rectal cancer Neg Hx    Stomach cancer Neg Hx    Social History   Socioeconomic History   Marital status: Married    Spouse name: [name]   Number of children: 4   Years of education: Not on file   Highest education level: Not on file  Occupational History   Occupation: 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   Adult children. Two live in [place]   Social Determinants of Health   Financial Resource Strain: Low Risk  (06/17/2022)   Overall Financial Resource Strain (CARDIA)    Difficulty of Paying Living Expenses: Not hard at all  Food Insecurity:  No Food Insecurity (06/17/2022)   Hunger Vital Sign    Worried About Running Out of Food in the Last Year: Never true    Ran Out of Food in the Last Year: Never true  Transportation Needs: No Transportation Needs (06/17/2022)   PRAPARE - THydrologist(Medical): No    Lack of Transportation (Non-Medical): No  Physical Activity: Insufficiently Active (06/17/2022)   Exercise Vital Sign    Days of Exercise per Week: 4 days    Minutes of Exercise per Session: 30 min  Stress: No Stress Concern Present (06/17/2022)   FFort Atkinson   Feeling of Stress : Not at all  Social  Connections: Socially Integrated (06/17/2022)   Social Connection and Isolation Panel [NHANES]    Frequency of Communication with Friends and Family: More than three times a week    Frequency of Social Gatherings with Friends and Family: More than three times a week    Attends Religious Services: More than 4 times per year    Active Member of [employer] or Organizations: Yes    Attends Music therapist: More than 4 times per year    Marital Status: Married    Tobacco Counseling Counseling given: Not Answered   Clinical Intake:  Pre-visit preparation completed: Yes  Pain : No/denies pain     Nutritional Risks: None Diabetes: No  How often do you need to have someone help you when you read instructions, pamphlets, or other written materials from your doctor or pharmacy?: 1 - Never  Diabetic?no   Interpreter Needed?: No  Information entered by :: Jadene Pierini, LPN   Activities of Daily Living    06/17/2022    9:27 AM  In your present state of health, do you have any difficulty performing the following activities:  Hearing? 0  Vision? 0  Difficulty concentrating or making decisions? 0  Walking or climbing stairs? 0  Dressing or bathing? 0  Doing errands, shopping? 0  Preparing Food and eating ? N   Using the Toilet? N  In the past six months, have you accidently leaked urine? N  Do you have problems with loss of bowel control? N  Managing your Medications? N  Managing your Finances? N  Housekeeping or managing your Housekeeping? N    Patient Care Team: Hoyt Koch, MD as PCP - General (Internal Medicine) [name], MD as Consulting Physician (Obstetrics and Gynecology) Pedro Earls, MD as Attending Physician (Family Medicine) Luberta Mutter, MD as Consulting Physician (Ophthalmology)  Indicate any recent Medical Services you may have received from other than [facility] providers in the past year (date may be approximate).     Assessment:   This is a routine wellness examination for Annielee.  Hearing/Vision screen Vision Screening - Comments:: Annual eye exams wear contact and glasses   Dietary issues and exercise activities discussed: Current Exercise Habits: Home exercise routine, Type of exercise: walking, Time (Minutes): 30, Frequency (Times/Week): 4, Weekly Exercise (Minutes/Week): 120, Intensity: Mild, Exercise limited by: None identified   Goals Addressed             This Visit's Progress    Exercise 3x per week (30 min per time)   On track    Patient will try to exercise at least 3 times a week for 30 minutes on her treadmill.       Depression Screen    06/17/2022    9:26 AM 06/16/2022    8:28 AM 06/03/2021    3:09 PM 09/10/2020   10:31 AM 04/28/2020    3:45 PM 02/26/2020   10:08 AM 11/27/2019   11:19 AM  PHQ 2/9 Scores  PHQ - 2 Score 0 0 0 0 0 0 0  PHQ- 9 Score 0 0         Fall Risk    06/17/2022    9:24 AM 06/16/2022    8:27 AM 06/03/2021    3:07 PM 09/10/2020   10:31 AM 04/28/2020    3:45 PM  Fall Risk   Falls in the past year? 0 0 0 0 0  Number falls in past yr: 0 0 0 0 0  Injury with  Fall? 0 0 0 0 0  Risk for fall due to : No Fall Risks No Fall Risks No Fall Risks  No Fall Risks  Follow up Falls prevention discussed Falls  evaluation completed Falls evaluation completed Falls evaluation completed Falls evaluation completed    [place]:  Any stairs in or around the home? No  If so, are there any without handrails? No  Home free of loose throw rugs in walkways, pet beds, electrical cords, etc? Yes  Adequate lighting in your home to reduce risk of falls? Yes   ASSISTIVE DEVICES UTILIZED TO PREVENT FALLS:  Life alert? No  Use of a cane, walker or w/c? No  Grab bars in the bathroom? Yes  Shower chair or bench in shower? No  Elevated toilet seat or a handicapped toilet? No         06/17/2022    9:27 AM 02/26/2020   10:06 AM 02/25/2019    2:11 PM 05/05/2017    9:32 AM  6CIT Screen  What Year? 0 points 0 points 0 points 0 points  What month? 0 points 0 points 0 points 0 points  What time? 0 points 0 points 0 points 0 points  Count back from 20 0 points 0 points 0 points 0 points  Months in reverse 0 points 0 points 0 points 0 points  Repeat phrase 0 points 0 points 0 points 0 points  Total Score 0 points 0 points 0 points 0 points    Immunizations Immunization History  Administered Date(s) Administered   Fluad Quad(high Dose 65+) 05/29/2019, 06/08/2021, 06/16/2022   Influenza, High Dose Seasonal PF 06/18/2018   Influenza,inj,Quad PF,6+ Mos 06/19/2013   Influenza,inj,quad, With Preservative 04/29/2018, 05/02/2019   Influenza-Unspecified 06/15/2015, 06/15/2016, 07/04/2017, 06/09/2020   Moderna Sars-Covid-2 Vaccination 10/11/2019, 11/10/2019, 07/10/2020   Pneumococcal Conjugate-13 09/08/2015   Pneumococcal Polysaccharide-23 11/02/2013   Zoster Recombinat (Shingrix) 06/02/2018, 08/08/2018   Zoster, Live 06/28/2016    TDAP status: Up to date  Flu Vaccine status: Up to date  Pneumococcal vaccine status: Up to date  Covid-19 vaccine status: Completed vaccines  Qualifies for Shingles Vaccine? Yes   Zostavax completed Yes   Shingrix Completed?: Yes  Screening  Tests Health Maintenance  Topic Date Due   TETANUS/TDAP  Never done   COVID-19 Vaccine (4 - Moderna risk series) 09/04/2020   MAMMOGRAM  10/22/2022   Pneumonia Vaccine 40+ Years old  Completed   INFLUENZA VACCINE  Completed   DEXA SCAN  Completed   Hepatitis C Screening  Completed   Zoster Vaccines- Shingrix  Completed   HPV VACCINES  Aged Out   COLONOSCOPY (Pts 45-38yr Insurance coverage will need to be confirmed)  Discontinued    Health Maintenance  Health Maintenance Due  Topic Date Due   TETANUS/TDAP  Never done   COVID-19 Vaccine (4 - Moderna risk series) 09/04/2020    Colorectal cancer screening: Type of screening: Colonoscopy. Completed 11/20/2019. Repeat every 10 years  Mammogram status: Completed 10/22/2021. Repeat every year  Bone Density status: Completed 10/22/2021. Results reflect: Bone density results: OSTEOPOROSIS. Repeat every 2 years.  Lung Cancer Screening: (Low Dose CT Chest recommended if Age 73-80years, 30 pack-year currently smoking OR have quit w/in 15years.) does not qualify.   Lung Cancer Screening Referral: n/a  Additional Screening:  Hepatitis C Screening: does not qualify;   Vision Screening: Recommended annual ophthalmology exams for early detection of glaucoma and other disorders of the eye. Is the patient  up to date with their annual eye exam?  Yes  Who is the provider or what is the name of the office in which the patient attends annual eye exams? Dr.McQuen If pt is not established with a provider, would they like to be referred to a provider to establish care? No .   Dental Screening: Recommended annual dental exams for proper oral hygiene  Community Resource Referral / Chronic Care Management: CRR required this visit?  No   CCM required this visit?  No      Plan:     I have personally reviewed and noted the following in the patient's chart:   Medical and social history Use of alcohol, tobacco or illicit drugs  Current  medications and supplements including opioid prescriptions. Patient is not currently taking opioid prescriptions. Functional ability and status Nutritional status Physical activity Advanced directives List of other physicians Hospitalizations, surgeries, and ER visits in previous 12 months Vitals Screenings to include cognitive, depression, and falls Referrals and appointments  In addition, I have reviewed and discussed with patient certain preventive protocols, quality metrics, and best practice recommendations. A written personalized care plan for preventive services as well as general preventive health recommendations were provided to patient.     Daphane Shepherd, LPN   [id number]   Nurse Notes:

## 2022-06-17 NOTE — Patient Instructions (Signed)
Amanda Curry , Thank you for taking time to come for your Medicare Wellness Visit. I appreciate your ongoing commitment to your health goals. Please review the following plan we discussed and let me know if I can assist you in the future.   These are the goals we discussed:  Goals      Exercise 3x per week (30 min per time)     Patient will try to exercise at least 3 times a week for 30 minutes on her treadmill.     Increase physical activity     Patient would like to do more stretching exercise     Patient Stated     Continue exercise at home         This is a list of the screening recommended for you and due dates:  Health Maintenance  Topic Date Due   Tetanus Vaccine  Never done   COVID-19 Vaccine (4 - Moderna risk series) 09/04/2020   Mammogram  10/22/2022   Pneumonia Vaccine  Completed   Flu Shot  Completed   DEXA scan (bone density measurement)  Completed   Hepatitis C Screening: USPSTF Recommendation to screen - Ages 70-79 yo.  Completed   Zoster (Shingles) Vaccine  Completed   HPV Vaccine  Aged Out   Colon Cancer Screening  Discontinued    Advanced directives: Please bring a copy of your health care power of attorney and living will to the office to be added to your chart at your convenience.   Conditions/risks identified: Aim for 30 minutes of exercise or brisk walking, 6-8 glasses of water, and 5 servings of fruits and vegetables each day.   Next appointment: Follow up in one year for your annual wellness visit    Preventive Care 65 Years and Older, Female Preventive care refers to lifestyle choices and visits with your health care provider that can promote health and wellness. What does preventive care include? A yearly physical exam. This is also called an annual well check. Dental exams once or twice a year. Routine eye exams. Ask your health care provider how often you should have your eyes checked. Personal lifestyle choices, including: Daily care of your  teeth and gums. Regular physical activity. Eating a healthy diet. Avoiding tobacco and drug use. Limiting alcohol use. Practicing safe sex. Taking low-dose aspirin every day. Taking vitamin and mineral supplements as recommended by your health care provider. What happens during an annual well check? The services and screenings done by your health care provider during your annual well check will depend on your age, overall health, lifestyle risk factors, and family history of disease. Counseling  Your health care provider may ask you questions about your: Alcohol use. Tobacco use. Drug use. Emotional well-being. Home and relationship well-being. Sexual activity. Eating habits. History of falls. Memory and ability to understand (cognition). Work and work Statistician. Reproductive health. Screening  You may have the following tests or measurements: Height, weight, and BMI. Blood pressure. Lipid and cholesterol levels. These may be checked every 5 years, or more frequently if you are over 25 years old. Skin check. Lung cancer screening. You may have this screening every year starting at age 41 if you have a 30-pack-year history of smoking and currently smoke or have quit within the past 15 years. Fecal occult blood test (FOBT) of the stool. You may have this test every year starting at age 35. Flexible sigmoidoscopy or colonoscopy. You may have a sigmoidoscopy every 5 years or a colonoscopy  every 10 years starting at age 45. Hepatitis C blood test. Hepatitis B blood test. Sexually transmitted disease (STD) testing. Diabetes screening. This is done by checking your blood sugar (glucose) after you have not eaten for a while (fasting). You may have this done every 1-3 years. Bone density scan. This is done to screen for osteoporosis. You may have this done starting at age 39. Mammogram. This may be done every 1-2 years. Talk to your health care provider about how often you should have  regular mammograms. Talk with your health care provider about your test results, treatment options, and if necessary, the need for more tests. Vaccines  Your health care provider may recommend certain vaccines, such as: Influenza vaccine. This is recommended every year. Tetanus, diphtheria, and acellular pertussis (Tdap, Td) vaccine. You may need a Td booster every 10 years. Zoster vaccine. You may need this after age 27. Pneumococcal 13-valent conjugate (PCV13) vaccine. One dose is recommended after age 64. Pneumococcal polysaccharide (PPSV23) vaccine. One dose is recommended after age 35. Talk to your health care provider about which screenings and vaccines you need and how often you need them. This information is not intended to replace advice given to you by your health care provider. Make sure you discuss any questions you have with your health care provider. Document Released: 09/11/2015 Document Revised: 05/04/2016 Document Reviewed: 06/16/2015 Elsevier Interactive Patient Education  2017 [place] Prevention in the Home Falls can cause injuries. They can happen to people of all ages. There are many things you can do to make your home safe and to help prevent falls. What can I do on the outside of my home? Regularly fix the edges of walkways and driveways and fix any cracks. Remove anything that might make you trip as you walk through a door, such as a raised step or threshold. Trim any bushes or trees on the path to your home. Use bright outdoor lighting. Clear any walking paths of anything that might make someone trip, such as rocks or tools. Regularly check to see if handrails are loose or broken. Make sure that both sides of any steps have handrails. Any raised decks and porches should have guardrails on the edges. Have any leaves, snow, or ice cleared regularly. Use sand or salt on walking paths during winter. Clean up any spills in your garage right away. This  includes oil or grease spills. What can I do in the bathroom? Use night lights. Install grab bars by the toilet and in the tub and shower. Do not use towel bars as grab bars. Use non-skid mats or decals in the tub or shower. If you need to sit down in the shower, use a plastic, non-slip stool. Keep the floor dry. Clean up any water that spills on the floor as soon as it happens. Remove soap buildup in the tub or shower regularly. Attach bath mats securely with double-sided non-slip rug tape. Do not have throw rugs and other things on the floor that can make you trip. What can I do in the bedroom? Use night lights. Make sure that you have a light by your bed that is easy to reach. Do not use any sheets or blankets that are too big for your bed. They should not hang down onto the floor. Have a firm chair that has side arms. You can use this for support while you get dressed. Do not have throw rugs and other things on the floor that can make  you trip. What can I do in the kitchen? Clean up any spills right away. Avoid walking on wet floors. Keep items that you use a lot in easy-to-reach places. If you need to reach something above you, use a strong step stool that has a grab bar. Keep electrical cords out of the way. Do not use floor polish or wax that makes floors slippery. If you must use wax, use non-skid floor wax. Do not have throw rugs and other things on the floor that can make you trip. What can I do with my stairs? Do not leave any items on the stairs. Make sure that there are handrails on both sides of the stairs and use them. Fix handrails that are broken or loose. Make sure that handrails are as long as the stairways. Check any carpeting to make sure that it is firmly attached to the stairs. Fix any carpet that is loose or worn. Avoid having throw rugs at the top or bottom of the stairs. If you do have throw rugs, attach them to the floor with carpet tape. Make sure that you  have a light switch at the top of the stairs and the bottom of the stairs. If you do not have them, ask someone to add them for you. What else can I do to help prevent falls? Wear shoes that: Do not have high heels. Have rubber bottoms. Are comfortable and fit you well. Are closed at the toe. Do not wear sandals. If you use a stepladder: Make sure that it is fully opened. Do not climb a closed stepladder. Make sure that both sides of the stepladder are locked into place. Ask someone to hold it for you, if possible. Clearly mark and make sure that you can see: Any grab bars or handrails. First and last steps. Where the edge of each step is. Use tools that help you move around (mobility aids) if they are needed. These include: Canes. Walkers. Scooters. Crutches. Turn on the lights when you go into a dark area. Replace any light bulbs as soon as they burn out. Set up your furniture so you have a clear path. Avoid moving your furniture around. If any of your floors are uneven, fix them. If there are any pets around you, be aware of where they are. Review your medicines with your doctor. Some medicines can make you feel dizzy. This can increase your chance of falling. Ask your doctor what other things that you can do to help prevent falls. This information is not intended to replace advice given to you by your health care provider. Make sure you discuss any questions you have with your health care provider. Document Released: 06/11/2009 Document Revised: 01/21/2016 Document Reviewed: 09/19/2014 Elsevier Interactive Patient Education  2017 [employer].

## 2022-06-21 DIAGNOSIS — Z9889 Other specified postprocedural states: Secondary | ICD-10-CM | POA: Diagnosis not present

## 2022-06-24 ENCOUNTER — Other Ambulatory Visit: Payer: Self-pay | Admitting: Internal Medicine

## 2022-06-24 DIAGNOSIS — I10 Essential (primary) hypertension: Secondary | ICD-10-CM

## 2022-07-01 ENCOUNTER — Encounter: Payer: Self-pay | Admitting: Internal Medicine

## 2022-07-08 ENCOUNTER — Other Ambulatory Visit (INDEPENDENT_AMBULATORY_CARE_PROVIDER_SITE_OTHER): Payer: PPO

## 2022-07-08 ENCOUNTER — Ambulatory Visit (INDEPENDENT_AMBULATORY_CARE_PROVIDER_SITE_OTHER): Payer: PPO | Admitting: Internal Medicine

## 2022-07-08 ENCOUNTER — Encounter: Payer: Self-pay | Admitting: Internal Medicine

## 2022-07-08 VITALS — BP 120/80 | HR 109 | Temp 98.4°F | Ht 61.0 in | Wt 133.0 lb

## 2022-07-08 DIAGNOSIS — E871 Hypo-osmolality and hyponatremia: Secondary | ICD-10-CM | POA: Diagnosis not present

## 2022-07-08 DIAGNOSIS — I10 Essential (primary) hypertension: Secondary | ICD-10-CM

## 2022-07-08 DIAGNOSIS — E782 Mixed hyperlipidemia: Secondary | ICD-10-CM

## 2022-07-08 LAB — BASIC METABOLIC PANEL
BUN: 17 mg/dL (ref 6–23)
CO2: 28 mEq/L (ref 19–32)
Calcium: 9.1 mg/dL (ref 8.4–10.5)
Chloride: 96 mEq/L (ref 96–112)
Creatinine, Ser: 0.95 mg/dL (ref 0.40–1.20)
GFR: 59.35 mL/min — ABNORMAL LOW (ref >60.00)
Glucose, Bld: 102 mg/dL — ABNORMAL HIGH (ref 70–99)
Potassium: 4.5 mEq/L (ref 3.5–5.1)
Sodium: 129 mEq/L — ABNORMAL LOW (ref 135–145)

## 2022-07-08 MED ORDER — PRAVASTATIN SODIUM 10 MG PO TABS: 10.0000 mg | ORAL_TABLET | Freq: Every day | ORAL | 3 refills | Status: DC

## 2022-07-08 NOTE — Patient Instructions (Signed)
We will try switching to pravastatin 10 mg daily for the cholesterol.

## 2022-07-08 NOTE — Assessment & Plan Note (Signed)
Due to cost we will stop livalo 2 mg daily and start pravastatin 10 mg daily and recheck lipids in 3-6 months.

## 2022-07-08 NOTE — Progress Notes (Signed)
   Subjective:   Patient ID: Amanda Curry, female    DOB: 1951-08-11, 71 y.o.   MRN: [id number]  HPI The patient is a 71 YO female coming in for follow up low sodium level.  Review of Systems  Constitutional: Negative.   HENT: Negative.    Eyes: Negative.   Respiratory:  Negative for cough, chest tightness and shortness of breath.   Cardiovascular:  Negative for chest pain, palpitations and leg swelling.  Gastrointestinal:  Negative for abdominal distention, abdominal pain, constipation, diarrhea, nausea and vomiting.  Musculoskeletal:  Positive for arthralgias.  Skin: Negative.   Neurological: Negative.   Psychiatric/Behavioral: Negative.      Objective:  Physical Exam Constitutional:      Appearance: She is well-developed.  HENT:     Head: Normocephalic and atraumatic.  Cardiovascular:     Rate and Rhythm: Normal rate and regular rhythm.  Pulmonary:     Effort: Pulmonary effort is normal. No respiratory distress.     Breath sounds: Normal breath sounds. No wheezing or rales.  Abdominal:     General: Bowel sounds are normal. There is no distension.     Palpations: Abdomen is soft.     Tenderness: There is no abdominal tenderness. There is no rebound.  Musculoskeletal:     Cervical back: Normal range of motion.  Skin:    General: Skin is warm and dry.  Neurological:     Mental Status: She is alert and oriented to person, place, and time.     Coordination: Coordination normal.     Vitals:   07/08/22 1029  BP: 120/80  Pulse: (!) 109  Temp: 98.4 F (36.9 C)  TempSrc: Oral  SpO2: 95%  Weight: 133 lb (60.3 kg)  Height: '5\' 1"'$  (1.549 m)    Assessment & Plan:

## 2022-07-08 NOTE — Assessment & Plan Note (Signed)
Mild and rechecking levels today. She has no current signs of this. She did have tingling around time of low levels about 1 month ago. This is resolving and barely tingling left fingers now.

## 2022-07-13 DIAGNOSIS — G5602 Carpal tunnel syndrome, left upper limb: Secondary | ICD-10-CM | POA: Diagnosis not present

## 2022-07-13 DIAGNOSIS — M13842 Other specified arthritis, left hand: Secondary | ICD-10-CM | POA: Diagnosis not present

## 2022-07-13 DIAGNOSIS — M79642 Pain in left hand: Secondary | ICD-10-CM | POA: Diagnosis not present

## 2022-07-16 ENCOUNTER — Encounter: Payer: Self-pay | Admitting: Internal Medicine

## 2022-07-16 ENCOUNTER — Other Ambulatory Visit: Payer: Self-pay | Admitting: Internal Medicine

## 2022-07-16 DIAGNOSIS — I10 Essential (primary) hypertension: Secondary | ICD-10-CM

## 2022-08-31 ENCOUNTER — Other Ambulatory Visit: Payer: Self-pay | Admitting: Gastroenterology

## 2022-09-01 DIAGNOSIS — M13842 Other specified arthritis, left hand: Secondary | ICD-10-CM | POA: Diagnosis not present

## 2022-09-01 DIAGNOSIS — Z4789 Encounter for other orthopedic aftercare: Secondary | ICD-10-CM | POA: Diagnosis not present

## 2022-09-01 DIAGNOSIS — G5602 Carpal tunnel syndrome, left upper limb: Secondary | ICD-10-CM | POA: Diagnosis not present

## 2022-09-01 DIAGNOSIS — M79642 Pain in left hand: Secondary | ICD-10-CM | POA: Diagnosis not present

## 2022-09-29 ENCOUNTER — Ambulatory Visit (INDEPENDENT_AMBULATORY_CARE_PROVIDER_SITE_OTHER): Payer: PPO | Admitting: Podiatry

## 2022-09-29 ENCOUNTER — Ambulatory Visit (INDEPENDENT_AMBULATORY_CARE_PROVIDER_SITE_OTHER): Payer: PPO

## 2022-09-29 DIAGNOSIS — M19071 Primary osteoarthritis, right ankle and foot: Secondary | ICD-10-CM

## 2022-09-29 NOTE — Progress Notes (Signed)
Subjective:  Patient ID: Amanda Curry, female    DOB: 1951/06/28,  MRN: [id number] HPI She has a painful knot in the dorsal aspect of her right foot that is been present for several months   72 y.o. female presents with the above complaint.   ROS: Denies fever chills nausea vomit muscle aches pains calf pain back pain chest pain.  Past Medical History:  Diagnosis Date   Adenomatous polyps    Arthritis    Diverticulosis    Ganglion of left wrist    thumb joint   GERD (gastroesophageal reflux disease)    Hyperlipidemia    Hypertension    Pre-diabetes    Tinnitus    Past Surgical History:  Procedure Laterality Date   ABDOMINAL HYSTERECTOMY N/A    Phreesia 02/23/2020   ANTERIOR AND POSTERIOR REPAIR WITH SACROSPINOUS FIXATION N/A 01/20/2020   Procedure: ANTERIOR AND POSTERIOR REPAIR WITH SACROSPINOUS FIXATION;  Surgeon: [name], MD;  Location: [place];  Service: Gynecology;  Laterality: N/A;   COLONOSCOPY     x 3   CYSTOSCOPY N/A 01/20/2020   Procedure: CYSTOSCOPY;  Surgeon: [name], MD;  Location: [facility];  Service: Gynecology;  Laterality: N/A;   JOINT REPLACEMENT Right 05/2019   LAPAROSCOPIC VAGINAL HYSTERECTOMY WITH SALPINGO OOPHORECTOMY Bilateral 01/20/2020   Procedure: LAPAROSCOPIC ASSISTED VAGINAL HYSTERECTOMY WITH SALPINGO OOPHORECTOMY;  Surgeon: [name], MD;  Location: [place];  Service: Gynecology;  Laterality: Bilateral;  NEED BED   POLYPECTOMY     right trigger finger correction surgery     TONSILLECTOMY     TOTAL KNEE ARTHROPLASTY Right 06/24/2019   Procedure: TOTAL KNEE ARTHROPLASTY;  Surgeon: Gaynelle Arabian, MD;  Location: [facility];  Service: Orthopedics;  Laterality: Right;  38mn   TUBAL LIGATION      Current Outpatient Medications:    colestipol (COLESTID) 1 g tablet, TAKE 1 TABLET(1 GRAM) BY MOUTH TWICE DAILY, Disp: 180 tablet, Rfl: 0   estradiol (ESTRACE) 0.1 MG/GM vaginal cream, , Disp:  , Rfl:    famotidine (PEPCID) 40 MG tablet, Take 1 tablet (40 mg total) by mouth at bedtime., Disp: 90 tablet, Rfl: 3   hydrocortisone cream 1 %, Apply 1 application topically daily as needed for itching. , Disp: , Rfl:    melatonin 5 MG TABS, Take 5 mg by mouth at bedtime., Disp: , Rfl:    pravastatin (PRAVACHOL) 10 MG tablet, Take 1 tablet (10 mg total) by mouth daily., Disp: 90 tablet, Rfl: 3   pseudoephedrine (SUDAFED) 30 MG tablet, Take 30 mg by mouth every 4 (four) hours as needed for congestion., Disp: , Rfl:    psyllium (METAMUCIL) 58.6 % powder, Take 1 packet by mouth 3 (three) times daily., Disp: , Rfl:    simethicone (MYLICON) 80 MG chewable tablet, Chew 80 mg by mouth every 6 (six) hours as needed for flatulence., Disp: , Rfl:    spironolactone (ALDACTONE) 50 MG tablet, TAKE 1 TABLET(50 MG) BY MOUTH DAILY, Disp: 90 tablet, Rfl: 1   triamcinolone cream (KENALOG) 0.1 %, Apply 1 application topically daily as needed (eczema)., Disp: 30 g, Rfl: 1  Allergies  Allergen Reactions   Hctz [Hydrochlorothiazide] Other (See Comments)    Causes extreme hyponatremia quickly - 126   Epinephrine Other (See Comments)    Brief trembling sensation    Novocain [Procaine]     "makes me tremble"    Olmesartan    Amlodipine Swelling    Mild but chronic  lower extremity edema   Lidocaine-Epinephrine Anxiety   Other    Review of Systems Objective:  There were no vitals filed for this visit.  General: Well developed, nourished, in no acute distress, alert and oriented x3   Dermatological: Skin is warm, dry and supple bilateral. Nails x 10 are well maintained; remaining integument appears unremarkable at this time. There are no open sores, no preulcerative lesions, no rash or signs of infection present.  Vascular: Dorsalis Pedis artery and Posterior Tibial artery pedal pulses are 2/4 bilateral with immedate capillary fill time. Pedal hair growth present. No varicosities and no lower extremity edema  present bilateral.   Neruologic: Grossly intact via light touch bilateral. Vibratory intact via tuning fork bilateral. Protective threshold with Semmes Wienstein monofilament intact to all pedal sites bilateral. Patellar and Achilles deep tendon reflexes 2+ bilateral. No Babinski or clonus noted bilateral.   Musculoskeletal: No gross boney pedal deformities bilateral. No pain, crepitus, or limitation noted with foot and ankle range of motion bilateral. Muscular strength 5/5 in all groups tested bilateral.  Osteoarthritic spurring at the base of the first metatarsal at the tarsometatarsal joint.  No erythema no skin breakdown mild tenderness on palpation of the peroneal nerve in this area.  Gait: Unassisted, Nonantalgic.    Radiographs:  Radiographs taken today demonstrate an osseously mature individual with generalized demineralization she does have some osteoarthritic changes at the tarsometatarsal joint with some dorsal spurring.  Assessment & Plan:   Assessment: Osteoarthritis.  Plan: Discussed topical anti-inflammatories discussed injections.  She states that she would have it injected if necessary but she would like to try topicals first.     Amanda Curry, [place]

## 2022-10-06 ENCOUNTER — Encounter: Payer: Self-pay | Admitting: Internal Medicine

## 2022-10-06 ENCOUNTER — Ambulatory Visit (INDEPENDENT_AMBULATORY_CARE_PROVIDER_SITE_OTHER): Payer: PPO | Admitting: Internal Medicine

## 2022-10-06 VITALS — BP 160/80 | HR 89 | Temp 98.0°F | Ht 61.0 in | Wt 134.0 lb

## 2022-10-06 DIAGNOSIS — R350 Frequency of micturition: Secondary | ICD-10-CM | POA: Diagnosis not present

## 2022-10-06 MED ORDER — CEPHALEXIN 500 MG PO CAPS: 500.0000 mg | ORAL_CAPSULE | Freq: Two times a day (BID) | ORAL | 0 refills | Status: AC

## 2022-10-06 NOTE — Patient Instructions (Signed)
We will try to check the urine and have sent in keflex 5 day course to take 1 pill twice a day.

## 2022-10-06 NOTE — Progress Notes (Signed)
   Subjective:   Patient ID: Amanda Curry, female    DOB: 12-Oct-1950, 72 y.o.   MRN: [id number]  HPI The patient is a 72 YO female coming in for possible UTI  Review of Systems  Constitutional: Negative.   Respiratory: Negative.    Cardiovascular: Negative.   Gastrointestinal:  Positive for abdominal pain. Negative for abdominal distention, constipation, diarrhea, nausea and vomiting.  Genitourinary:  Positive for dysuria, frequency and urgency.  Musculoskeletal: Negative.   Skin: Negative.     Objective:  Physical Exam Constitutional:      Appearance: Normal appearance. She is well-developed.  HENT:     Head: Normocephalic and atraumatic.  Eyes:     Extraocular Movements: Extraocular movements intact.  Cardiovascular:     Rate and Rhythm: Normal rate and regular rhythm.  Pulmonary:     Effort: Pulmonary effort is normal. No respiratory distress.     Breath sounds: Normal breath sounds. No wheezing or rales.  Abdominal:     General: Bowel sounds are normal. There is no distension.     Palpations: Abdomen is soft.     Tenderness: There is no abdominal tenderness. There is no rebound.  Musculoskeletal:     Cervical back: Normal range of motion.  Skin:    General: Skin is warm and dry.  Neurological:     Mental Status: She is alert and oriented to person, place, and time.     Coordination: Coordination normal.     Vitals:   10/06/22 0808 10/06/22 0814  BP: (!) 160/80 (!) 160/80  Pulse: 89   Temp: 98 F (36.7 C)   TempSrc: Oral   SpO2: 98%   Weight: 134 lb (60.8 kg)   Height: '5\' 1"'$  (1.549 m)     Assessment & Plan:

## 2022-10-06 NOTE — Assessment & Plan Note (Signed)
Symptoms for 1 week of discomfort with urinating, cloudy urine, frequency. Treat empirically as she is unable to urinate at office today. Rx keflex 5 day course. If lack of improvement will need U/A and culture.

## 2022-10-12 ENCOUNTER — Encounter: Payer: Self-pay | Admitting: Internal Medicine

## 2022-10-12 DIAGNOSIS — I10 Essential (primary) hypertension: Secondary | ICD-10-CM

## 2022-10-12 MED ORDER — SPIRONOLACTONE 50 MG PO TABS: ORAL_TABLET | ORAL | 1 refills | Status: DC

## 2022-10-26 DIAGNOSIS — L570 Actinic keratosis: Secondary | ICD-10-CM | POA: Diagnosis not present

## 2022-10-26 DIAGNOSIS — L814 Other melanin hyperpigmentation: Secondary | ICD-10-CM | POA: Diagnosis not present

## 2022-10-26 DIAGNOSIS — Z85828 Personal history of other malignant neoplasm of skin: Secondary | ICD-10-CM | POA: Diagnosis not present

## 2022-10-26 DIAGNOSIS — D2271 Melanocytic nevi of right lower limb, including hip: Secondary | ICD-10-CM | POA: Diagnosis not present

## 2022-10-26 DIAGNOSIS — L578 Other skin changes due to chronic exposure to nonionizing radiation: Secondary | ICD-10-CM | POA: Diagnosis not present

## 2022-10-26 DIAGNOSIS — D225 Melanocytic nevi of trunk: Secondary | ICD-10-CM | POA: Diagnosis not present

## 2022-10-26 DIAGNOSIS — L821 Other seborrheic keratosis: Secondary | ICD-10-CM | POA: Diagnosis not present

## 2022-11-02 DIAGNOSIS — M65332 Trigger finger, left middle finger: Secondary | ICD-10-CM | POA: Diagnosis not present

## 2022-11-02 DIAGNOSIS — M13842 Other specified arthritis, left hand: Secondary | ICD-10-CM | POA: Diagnosis not present

## 2022-11-02 DIAGNOSIS — M67432 Ganglion, left wrist: Secondary | ICD-10-CM | POA: Diagnosis not present

## 2022-11-02 DIAGNOSIS — G5602 Carpal tunnel syndrome, left upper limb: Secondary | ICD-10-CM | POA: Diagnosis not present

## 2022-11-03 DIAGNOSIS — Z1231 Encounter for screening mammogram for malignant neoplasm of breast: Secondary | ICD-10-CM | POA: Diagnosis not present

## 2022-11-03 LAB — HM MAMMOGRAPHY

## 2022-11-25 DIAGNOSIS — D492 Neoplasm of unspecified behavior of bone, soft tissue, and skin: Secondary | ICD-10-CM | POA: Diagnosis not present

## 2022-11-25 DIAGNOSIS — G5602 Carpal tunnel syndrome, left upper limb: Secondary | ICD-10-CM | POA: Diagnosis not present

## 2022-11-25 DIAGNOSIS — M1812 Unilateral primary osteoarthritis of first carpometacarpal joint, left hand: Secondary | ICD-10-CM | POA: Diagnosis not present

## 2022-11-25 DIAGNOSIS — M25732 Osteophyte, left wrist: Secondary | ICD-10-CM | POA: Diagnosis not present

## 2022-11-25 DIAGNOSIS — M67432 Ganglion, left wrist: Secondary | ICD-10-CM | POA: Diagnosis not present

## 2022-11-25 DIAGNOSIS — M24032 Loose body in left wrist: Secondary | ICD-10-CM | POA: Diagnosis not present

## 2022-12-22 DIAGNOSIS — M25642 Stiffness of left hand, not elsewhere classified: Secondary | ICD-10-CM | POA: Diagnosis not present

## 2022-12-28 ENCOUNTER — Other Ambulatory Visit: Payer: Self-pay | Admitting: Internal Medicine

## 2022-12-29 DIAGNOSIS — M13842 Other specified arthritis, left hand: Secondary | ICD-10-CM | POA: Diagnosis not present

## 2022-12-29 DIAGNOSIS — M25642 Stiffness of left hand, not elsewhere classified: Secondary | ICD-10-CM | POA: Diagnosis not present

## 2022-12-29 DIAGNOSIS — M79642 Pain in left hand: Secondary | ICD-10-CM | POA: Diagnosis not present

## 2023-01-02 DIAGNOSIS — M25642 Stiffness of left hand, not elsewhere classified: Secondary | ICD-10-CM | POA: Diagnosis not present

## 2023-01-04 ENCOUNTER — Telehealth: Payer: Self-pay | Admitting: Internal Medicine

## 2023-01-04 NOTE — Telephone Encounter (Signed)
Contacted Amanda Curry to schedule their annual wellness visit. Appointment made for 01/16/2023.  [facility] Care Guide [facility] AWV TEAM Direct Dial: [phone]

## 2023-01-05 DIAGNOSIS — M25642 Stiffness of left hand, not elsewhere classified: Secondary | ICD-10-CM | POA: Diagnosis not present

## 2023-01-10 ENCOUNTER — Encounter: Payer: Self-pay | Admitting: Internal Medicine

## 2023-01-19 ENCOUNTER — Ambulatory Visit: Payer: PPO | Admitting: Internal Medicine

## 2023-01-19 ENCOUNTER — Encounter: Payer: Self-pay | Admitting: Internal Medicine

## 2023-01-19 VITALS — BP 138/80 | HR 68 | Ht 61.0 in

## 2023-01-19 DIAGNOSIS — R197 Diarrhea, unspecified: Secondary | ICD-10-CM

## 2023-01-19 NOTE — Progress Notes (Signed)
Primary Care Physician:  Myrlene Broker, MD  Chief complaint:  diarrhea  IMPRESSION:  Diarrhea, improved on Metamucil and colestipol    - random colon biopsies negative for microscopic colitis    - ESR, CRP, fecal calprotectin, stool cultures normal Elevated TSH 2019, normal 2020 History of colon polyps    - polypoid mucosa on colonoscopy 2009    - 4mm descending colon tubular adenoma on colonoscopy 2016    - no polyps on colonoscopy 10/2019    - consider colonoscopy 2031 if clinically appropriate at that time Descending colon diverticulosis Family history of colon cancer (maternal grandmother)  Continue bulking agent and bile acid binding agent has provided symptomatic relief. No further work-up needed at this time.   PLAN: - No medication changes at this time - Continue Metamucil BID and colestipol 1 g BID. Will give refills. - Annual follow-up, earlier if needed  I spent 30 minutes, including independent review of results, face-to-face time with the patient, coordinating care, ordering medications, and documentation.     HPI: Amanda Curry is a 72 y.o. female who returns in follow-up for her diarrhea.  She was previously followed by Dr. Dickie La for a diagnosis of IBS. She had a colonoscopy in 2009 that showed colon polyps but the pathology reports described polypoid mucosa. Patient was seen in July 2014 for acute diarrheal illness which responded to Flagyl. Records from 2016 note occasional gas and bloating and concerns for a recurrent infection similar to 2014. Colonoscopy with Dr. Dickie La 10/21/2014 showed moderate diverticulosis in the descending colon and a 4mm descending colon tubular adenoma. There is a family history of colon cancer in her grandmother.  Seen in consultation 06/11/18 for the acute onset of watery, explosive diarrhea.  In 05/2018 she had a normal CRP, ESR, giardia, fecal calprotectin, and stool culture.  TSH was elevated as high as 9 in May 2019. But normal  January 2020. Colonoscopy ultimately performed 11/20/2019 and showed pancolonic diverticulosis that was most severe in the left colon and internal hemorrhoids.  Right-sided and left-sided random colon biopsies were normal.  Interval History: She has been taking colestipol for the last 2.5 years, which has been working out well. She is also taking Metamucil 1-2 times per day. She is having 1-2 BMs per day on average. Denies blood in the stools. Denies abdominal pain. She had carpal tunnel surgery over the past year. Denies weight loss.   Wt Readings from Last 3 Encounters:  10/06/22 134 lb (60.8 kg)  07/08/22 133 lb (60.3 kg)  06/17/22 130 lb (59 kg)   Labs 05/2022: CMP with low sodium of 127. CBC nml.  Labs 06/2022: BMP with low Na of 129.  Past Medical History:  Diagnosis Date   Adenomatous polyps    Arthritis    Diverticulosis    Ganglion of left wrist    thumb joint   GERD (gastroesophageal reflux disease)    Hyperlipidemia    Hypertension    Pre-diabetes    Tinnitus     Past Surgical History:  Procedure Laterality Date   ABDOMINAL HYSTERECTOMY N/A    Phreesia 02/23/2020   ANTERIOR AND POSTERIOR REPAIR WITH SACROSPINOUS FIXATION N/A 01/20/2020   Procedure: ANTERIOR AND POSTERIOR REPAIR WITH SACROSPINOUS FIXATION;  Surgeon: Richardean Chimera, MD;  Location: [facility];  Service: Gynecology;  Laterality: N/A;   COLONOSCOPY     x 3   CYSTOSCOPY N/A 01/20/2020   Procedure: CYSTOSCOPY;  Surgeon: Richardean Chimera, MD;  Location:  [facility];  Service: Gynecology;  Laterality: N/A;   JOINT REPLACEMENT Right 05/2019   LAPAROSCOPIC VAGINAL HYSTERECTOMY WITH SALPINGO OOPHORECTOMY Bilateral 01/20/2020   Procedure: LAPAROSCOPIC ASSISTED VAGINAL HYSTERECTOMY WITH SALPINGO OOPHORECTOMY;  Surgeon: Richardean Chimera, MD;  Location: [facility];  Service: Gynecology;  Laterality: Bilateral;  NEED BED   POLYPECTOMY     right trigger finger correction surgery      TONSILLECTOMY     TOTAL KNEE ARTHROPLASTY Right 06/24/2019   Procedure: TOTAL KNEE ARTHROPLASTY;  Surgeon: Ollen Gross, MD;  Location: [facility];  Service: Orthopedics;  Laterality: Right;    TUBAL LIGATION      Current Outpatient Medications  Medication Sig Dispense Refill   colestipol (COLESTID) 1 g tablet TAKE 1 TABLET(1 GRAM) BY MOUTH TWICE DAILY 180 tablet 0   estradiol (ESTRACE) 0.1 MG/GM vaginal cream      famotidine (PEPCID) 40 MG tablet Take 1 tablet (40 mg total) by mouth at bedtime. 90 tablet 3   hydrocortisone cream 1 % Apply 1 application topically daily as needed for itching.      melatonin 5 MG TABS Take 5 mg by mouth at bedtime.     pravastatin (PRAVACHOL) 10 MG tablet Take 1 tablet (10 mg total) by mouth daily. 90 tablet 3   pseudoephedrine (SUDAFED) 30 MG tablet Take 30 mg by mouth every 4 (four) hours as needed for congestion.     psyllium (METAMUCIL) 58.6 % powder Take 1 packet by mouth 3 (three) times daily.     simethicone (MYLICON) 80 MG chewable tablet Chew 80 mg by mouth every 6 (six) hours as needed for flatulence.     spironolactone (ALDACTONE) 50 MG tablet TAKE 1 TABLET(50 MG) BY MOUTH DAILY 90 tablet 1   triamcinolone cream (KENALOG) 0.1 % Apply 1 application topically daily as needed (eczema). 30 g 1   No current facility-administered medications for this visit.    Allergies as of 01/19/2023 - Review Complete 01/19/2023  Allergen Reaction Noted   Hctz [hydrochlorothiazide] Other (See Comments) 10/22/2015   Epinephrine Other (See Comments) 11/06/2019   Novocain [procaine]  06/24/2019   Olmesartan  12/08/2020   Amlodipine Swelling 01/26/2018   Lidocaine-epinephrine Anxiety 01/05/2021   Other  02/23/2020    Family History  Problem Relation Age of Onset   Colon cancer Maternal Grandmother    Cancer Maternal Grandmother        colon   Prostate cancer Father    Cancer Father        prostate   Alzheimer's disease Mother    Hyperlipidemia Mother     Hypertension Mother    Heart disease Paternal Grandmother    Heart disease Paternal Grandfather    Hypertension Sister    Colon polyps Neg Hx    Esophageal cancer Neg Hx    Rectal cancer Neg Hx    Stomach cancer Neg Hx      General:   Alert,  Well-developed, well-nourished, pleasant and cooperative in NAD Head:  Normocephalic and atraumatic. Sclera anicteric.  Mouth:  No deformity or lesions.   Neck:  Supple; no masses or thyromegaly. Abdomen:  Soft, nontender, BS active,nonpalp mass or hsm.   Extremities:  No edema. Neurologic:  Alert and  oriented x4;  grossly normal neurologically. Skin:  Intact without significant lesions or rashes.. Psych:  Alert and cooperative. Normal mood and affect.  Eulah Pont, MD [facility]

## 2023-01-19 NOTE — Patient Instructions (Signed)
Follow up in 1 year  If your blood pressure at your visit was 140/90 or greater, please contact your primary care physician to follow up on this.  _______________________________________________________  If you are age 74 or older, your body mass index should be between 23-30. Your Body mass index is 25.32 kg/m. If this is out of the aforementioned range listed, please consider follow up with your Primary Care Provider.  If you are age 4 or younger, your body mass index should be between 19-25. Your Body mass index is 25.32 kg/m. If this is out of the aformentioned range listed, please consider follow up with your Primary Care Provider.   ________________________________________________________  The [facility] providers would like to encourage you to use [facility] to communicate with providers for non-urgent requests or questions.  Due to long hold times on the telephone, sending your provider a message by [facility] may be a faster and more efficient way to get a response.  Please allow 48 business hours for a response.  Please remember that this is for non-urgent requests.  _______________________________________________________   Thank you for entrusting me with your care and for choosing [facility], Dr. Eulah Pont

## 2023-01-31 ENCOUNTER — Ambulatory Visit (INDEPENDENT_AMBULATORY_CARE_PROVIDER_SITE_OTHER): Payer: PPO

## 2023-01-31 VITALS — Ht 61.0 in | Wt 132.0 lb

## 2023-01-31 DIAGNOSIS — Z0000 Encounter for general adult medical examination without abnormal findings: Secondary | ICD-10-CM

## 2023-01-31 NOTE — Patient Instructions (Addendum)
Amanda Curry , Thank you for taking time to come for your Medicare Wellness Visit. I appreciate your ongoing commitment to your health goals. Please review the following plan we discussed and let me know if I can assist you in the future.   These are the goals we discussed:  Goals      Exercise 3x per week (30 min per time)     Patient will try to exercise at least 3 times a week for 30 minutes on her treadmill.     Increase physical activity     Patient would like to do more stretching exercise     Patient Stated     Continue exercise at home.         This is a list of the screening recommended for you and due dates:  Health Maintenance  Topic Date Due   DTaP/Tdap/Td vaccine (1 - Tdap) Never done   COVID-19 Vaccine (4 - 2023-24 season) 02/16/2023*   Flu Shot  03/30/2023   Mammogram  11/03/2023   Medicare Annual Wellness Visit  01/31/2024   Pneumonia Vaccine  Completed   DEXA scan (bone density measurement)  Completed   Hepatitis C Screening  Completed   Zoster (Shingles) Vaccine  Completed   HPV Vaccine  Aged Out   Colon Cancer Screening  Discontinued  *Topic was postponed. The date shown is not the original due date.    Advanced directives: Please bring a copy of your health care power of attorney and living will to the office to be added to your chart at your convenience.   Conditions/risks identified: None  Next appointment: Follow up in one year for your annual wellness visit     Preventive Care 65 Years and Older, Female Preventive care refers to lifestyle choices and visits with your health care provider that can promote health and wellness. What does preventive care include? A yearly physical exam. This is also called an annual well check. Dental exams once or twice a year. Routine eye exams. Ask your health care provider how often you should have your eyes checked. Personal lifestyle choices, including: Daily care of your teeth and gums. Regular physical  activity. Eating a healthy diet. Avoiding tobacco and drug use. Limiting alcohol use. Practicing safe sex. Taking low-dose aspirin every day. Taking vitamin and mineral supplements as recommended by your health care provider. What happens during an annual well check? The services and screenings done by your health care provider during your annual well check will depend on your age, overall health, lifestyle risk factors, and family history of disease. Counseling  Your health care provider may ask you questions about your: Alcohol use. Tobacco use. Drug use. Emotional well-being. Home and relationship well-being. Sexual activity. Eating habits. History of falls. Memory and ability to understand (cognition). Work and work Astronomer. Reproductive health. Screening  You may have the following tests or measurements: Height, weight, and BMI. Blood pressure. Lipid and cholesterol levels. These may be checked every 5 years, or more frequently if you are over 50 years old. Skin check. Lung cancer screening. You may have this screening every year starting at age 11 if you have a 30-pack-year history of smoking and currently smoke or have quit within the past 15 years. Fecal occult blood test (FOBT) of the stool. You may have this test every year starting at age 46. Flexible sigmoidoscopy or colonoscopy. You may have a sigmoidoscopy every 5 years or a colonoscopy every 10 years starting at age 69.  Hepatitis C blood test. Hepatitis B blood test. Sexually transmitted disease (STD) testing. Diabetes screening. This is done by checking your blood sugar (glucose) after you have not eaten for a while (fasting). You may have this done every 1-3 years. Bone density scan. This is done to screen for osteoporosis. You may have this done starting at age 19. Mammogram. This may be done every 1-2 years. Talk to your health care provider about how often you should have regular mammograms. Talk with your  health care provider about your test results, treatment options, and if necessary, the need for more tests. Vaccines  Your health care provider may recommend certain vaccines, such as: Influenza vaccine. This is recommended every year. Tetanus, diphtheria, and acellular pertussis (Tdap, Td) vaccine. You may need a Td booster every 10 years. Zoster vaccine. You may need this after age 43. Pneumococcal 13-valent conjugate (PCV13) vaccine. One dose is recommended after age 40. Pneumococcal polysaccharide (PPSV23) vaccine. One dose is recommended after age 57. Talk to your health care provider about which screenings and vaccines you need and how often you need them. This information is not intended to replace advice given to you by your health care provider. Make sure you discuss any questions you have with your health care provider. Document Released: 09/11/2015 Document Revised: 05/04/2016 Document Reviewed: 06/16/2015 Elsevier Interactive Patient Education  2017 [employer].  Fall Prevention in the Home Falls can cause injuries. They can happen to people of all ages. There are many things you can do to make your home safe and to help prevent falls. What can I do on the outside of my home? Regularly fix the edges of walkways and driveways and fix any cracks. Remove anything that might make you trip as you walk through a door, such as a raised step or threshold. Trim any bushes or trees on the path to your home. Use bright outdoor lighting. Clear any walking paths of anything that might make someone trip, such as rocks or tools. Regularly check to see if handrails are loose or broken. Make sure that both sides of any steps have handrails. Any raised decks and porches should have guardrails on the edges. Have any leaves, snow, or ice cleared regularly. Use sand or salt on walking paths during winter. Clean up any spills in your garage right away. This includes oil or grease spills. What can I  do in the bathroom? Use night lights. Install grab bars by the toilet and in the tub and shower. Do not use towel bars as grab bars. Use non-skid mats or decals in the tub or shower. If you need to sit down in the shower, use a plastic, non-slip stool. Keep the floor dry. Clean up any water that spills on the floor as soon as it happens. Remove soap buildup in the tub or shower regularly. Attach bath mats securely with double-sided non-slip rug tape. Do not have throw rugs and other things on the floor that can make you trip. What can I do in the bedroom? Use night lights. Make sure that you have a light by your bed that is easy to reach. Do not use any sheets or blankets that are too big for your bed. They should not hang down onto the floor. Have a firm chair that has side arms. You can use this for support while you get dressed. Do not have throw rugs and other things on the floor that can make you trip. What can I do in  the kitchen? Clean up any spills right away. Avoid walking on wet floors. Keep items that you use a lot in easy-to-reach places. If you need to reach something above you, use a strong step stool that has a grab bar. Keep electrical cords out of the way. Do not use floor polish or wax that makes floors slippery. If you must use wax, use non-skid floor wax. Do not have throw rugs and other things on the floor that can make you trip. What can I do with my stairs? Do not leave any items on the stairs. Make sure that there are handrails on both sides of the stairs and use them. Fix handrails that are broken or loose. Make sure that handrails are as long as the stairways. Check any carpeting to make sure that it is firmly attached to the stairs. Fix any carpet that is loose or worn. Avoid having throw rugs at the top or bottom of the stairs. If you do have throw rugs, attach them to the floor with carpet tape. Make sure that you have a light switch at the top of the stairs  and the bottom of the stairs. If you do not have them, ask someone to add them for you. What else can I do to help prevent falls? Wear shoes that: Do not have high heels. Have rubber bottoms. Are comfortable and fit you well. Are closed at the toe. Do not wear sandals. If you use a stepladder: Make sure that it is fully opened. Do not climb a closed stepladder. Make sure that both sides of the stepladder are locked into place. Ask someone to hold it for you, if possible. Clearly mark and make sure that you can see: Any grab bars or handrails. First and last steps. Where the edge of each step is. Use tools that help you move around (mobility aids) if they are needed. These include: Canes. Walkers. Scooters. Crutches. Turn on the lights when you go into a dark area. Replace any light bulbs as soon as they burn out. Set up your furniture so you have a clear path. Avoid moving your furniture around. If any of your floors are uneven, fix them. If there are any pets around you, be aware of where they are. Review your medicines with your doctor. Some medicines can make you feel dizzy. This can increase your chance of falling. Ask your doctor what other things that you can do to help prevent falls. This information is not intended to replace advice given to you by your health care provider. Make sure you discuss any questions you have with your health care provider. Document Released: 06/11/2009 Document Revised: 01/21/2016 Document Reviewed: 09/19/2014 Elsevier Interactive Patient Education  2017 [employer]. Please bring a copy of your health care power of attorney and living will to the office to be added to your chart at your convenience.

## 2023-01-31 NOTE — Progress Notes (Signed)
Subjective:   Amanda Curry is a 72 y.o. female who presents for Medicare Annual (Subsequent) preventive examination.  Review of Systems    Virtual Visit via Telephone Note  I connected with  Amanda Curry on 01/31/23 at  3:30 PM EDT by telephone and verified that I am speaking with the correct person using two identifiers.  Location: Patient: Home Provider: Office Persons participating in the virtual visit: patient/Nurse Health Advisor   I discussed the limitations, risks, security and privacy concerns of performing an evaluation and management service by telephone and the availability of in person appointments. The patient expressed understanding and agreed to proceed.  Interactive audio and video telecommunications were attempted between this nurse and patient, however failed, due to patient having technical difficulties OR patient did not have access to video capability.  We continued and completed visit with audio only.  Some vital signs may be absent or patient reported.   Tillie Rung, LPN  Cardiac Risk Factors include: advanced age (>83men, >85 women);hypertension     Objective:    Today's Vitals   01/31/23 1533  Weight: 132 lb (59.9 kg)  Height: 5\' 1"  (1.549 m)   Body mass index is 24.94 kg/m.     01/31/2023    3:40 PM 06/17/2022    9:27 AM 06/03/2021    3:05 PM 02/26/2020   10:06 AM 01/27/2020   10:05 PM 01/20/2020    6:04 AM 01/10/2020   10:21 AM  Advanced Directives  Does Patient Have a Medical Advance Directive? Yes Yes Yes Yes Yes Yes Yes  Type of Estate agent of [place];Living will Healthcare Power of [place];Living will Living will;Healthcare Power of Attorney Living will;Healthcare Power of [employer] Power of [employer] Power of [place];Living will Healthcare Power of [place];Living will  Does patient want to make changes to medical advance directive?   No - Patient declined Yes (ED - Information included in  AVS) Yes (ED - Information included in AVS) No - Patient declined   Copy of Healthcare Power of Attorney in Chart? No - copy requested No - copy requested No - copy requested No - copy requested No - copy requested No - copy requested   Would patient like information on creating a medical advance directive?     Yes (ED - Information included in AVS)      Current Medications (verified) Outpatient Encounter Medications as of 01/31/2023  Medication Sig   colestipol (COLESTID) 1 g tablet TAKE 1 TABLET(1 GRAM) BY MOUTH TWICE DAILY   estradiol (ESTRACE) 0.1 MG/GM vaginal cream    famotidine (PEPCID) 40 MG tablet Take 1 tablet (40 mg total) by mouth at bedtime.   hydrocortisone cream 1 % Apply 1 application topically daily as needed for itching.    melatonin 5 MG TABS Take 5 mg by mouth at bedtime.   pravastatin (PRAVACHOL) 10 MG tablet Take 1 tablet (10 mg total) by mouth daily.   pseudoephedrine (SUDAFED) 30 MG tablet Take 30 mg by mouth every 4 (four) hours as needed for congestion.   psyllium (METAMUCIL) 58.6 % powder Take 1 packet by mouth 3 (three) times daily.   simethicone (MYLICON) 80 MG chewable tablet Chew 80 mg by mouth every 6 (six) hours as needed for flatulence.   spironolactone (ALDACTONE) 50 MG tablet TAKE 1 TABLET(50 MG) BY MOUTH DAILY   triamcinolone cream (KENALOG) 0.1 % Apply 1 application topically daily as needed (eczema).   No facility-administered encounter medications on  file as of 01/31/2023.    Allergies (verified) Hctz [hydrochlorothiazide], Epinephrine, Novocain [procaine], Olmesartan, Amlodipine, Lidocaine-epinephrine, and Other   History: Past Medical History:  Diagnosis Date   Adenomatous polyps    Arthritis    Diverticulosis    Ganglion of left wrist    thumb joint   GERD (gastroesophageal reflux disease)    Hyperlipidemia    Hypertension    Pre-diabetes    Tinnitus    Past Surgical History:  Procedure Laterality Date   ABDOMINAL HYSTERECTOMY N/A     Phreesia 02/23/2020   ANTERIOR AND POSTERIOR REPAIR WITH SACROSPINOUS FIXATION N/A 01/20/2020   Procedure: ANTERIOR AND POSTERIOR REPAIR WITH SACROSPINOUS FIXATION;  Surgeon: Richardean Chimera, MD;  Location: [facility];  Service: Gynecology;  Laterality: N/A;   COLONOSCOPY     x 3   CYSTOSCOPY N/A 01/20/2020   Procedure: CYSTOSCOPY;  Surgeon: Richardean Chimera, MD;  Location: [facility];  Service: Gynecology;  Laterality: N/A;   JOINT REPLACEMENT Right 05/2019   LAPAROSCOPIC VAGINAL HYSTERECTOMY WITH SALPINGO OOPHORECTOMY Bilateral 01/20/2020   Procedure: LAPAROSCOPIC ASSISTED VAGINAL HYSTERECTOMY WITH SALPINGO OOPHORECTOMY;  Surgeon: Richardean Chimera, MD;  Location: [facility];  Service: Gynecology;  Laterality: Bilateral;  NEED BED   POLYPECTOMY     right trigger finger correction surgery     TONSILLECTOMY     TOTAL KNEE ARTHROPLASTY Right 06/24/2019   Procedure: TOTAL KNEE ARTHROPLASTY;  Surgeon: Ollen Gross, MD;  Location: [facility];  Service: Orthopedics;  Laterality: Right;    TUBAL LIGATION     Family History  Problem Relation Age of Onset   Colon cancer Maternal Grandmother    Cancer Maternal Grandmother        colon   Prostate cancer Father    Cancer Father        prostate   Alzheimer's disease Mother    Hyperlipidemia Mother    Hypertension Mother    Heart disease Paternal Grandmother    Heart disease Paternal Grandfather    Hypertension Sister    Colon polyps Neg Hx    Esophageal cancer Neg Hx    Rectal cancer Neg Hx    Stomach cancer Neg Hx    Social History   Socioeconomic History   Marital status: Married    Spouse name: [name]   Number of children: 4   Years of education: Not on file   Highest education level: Not on file  Occupational History   Occupation: 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   Adult children. Two live in [place].   Social Determinants of Health   Financial Resource Strain: Low Risk  (01/31/2023)   Overall Financial Resource Strain (CARDIA)    Difficulty of Paying Living Expenses: Not hard at all  Food Insecurity: No Food Insecurity (01/31/2023)   Hunger Vital Sign    Worried About Running Out of Food in the Last Year: Never true    Ran Out of Food in the Last Year: Never true  Transportation Needs: No Transportation Needs (01/31/2023)  PRAPARE - Administrator, Civil Service (Medical): No    Lack of Transportation (Non-Medical): No  Physical Activity: Sufficiently Active (01/31/2023)   Exercise Vital Sign    Days of Exercise per Week: 6 days    Minutes of Exercise per Session: 30 min  Stress: No Stress Concern Present (01/31/2023)   [employer] of Occupational Health - Occupational Stress Questionnaire    Feeling of Stress : Not at all  Social Connections: Socially Integrated (01/31/2023)   Social Connection and Isolation Panel [NHANES]    Frequency of Communication with Friends and Family: More than three times a week    Frequency of Social Gatherings with Friends and Family: More than three times a week    Attends Religious Services: More than 4 times per year    Active Member of [employer] or Organizations: Yes    Attends Engineer, structural: More than 4 times per year    Marital Status: Married    Tobacco Counseling Counseling given: Not Answered   Clinical Intake:  Pre-visit preparation completed: Yes  Pain : No/denies pain     BMI - recorded: 24.94 Nutritional Risks: None Diabetes: No  How often do you need to have someone help you when you read  instructions, pamphlets, or other written materials from your doctor or pharmacy?: 1 - Never  Diabetic?  No  Interpreter Needed?: No  Information entered by :: Theresa Mulligan LPN   Activities of Daily Living    01/31/2023    3:39 PM 01/12/2023   10:24 AM  In your present state of health, do you have any difficulty performing the following activities:  Hearing? 0 0  Vision? 0 0  Difficulty concentrating or making decisions? 0 0  Walking or climbing stairs? 0 0  Dressing or bathing? 0 0  Doing errands, shopping? 0 0  Preparing Food and eating ? N N  Using the Toilet? N N  In the past six months, have you accidently leaked urine? N N  Do you have problems with loss of bowel control? N N  Managing your Medications? N N  Managing your Finances? N N  Housekeeping or managing your Housekeeping? N N    Patient Care Team: Myrlene Broker, MD as PCP - General (Internal Medicine) Richardean Chimera, MD as Consulting Physician (Obstetrics and Gynecology) Delfin Gant, MD as Attending Physician (Family Medicine) Maris Berger, MD as Consulting Physician (Ophthalmology)  Indicate any recent Medical Services you may have received from other than [facility] providers in the past year (date may be approximate).     Assessment:   This is a routine wellness examination for Amanda Curry.  Hearing/Vision screen Hearing Screening - Comments:: Denies hearing difficulties   Vision Screening - Comments:: Wears rx glasses - up to date with routine eye exams with  Dr Honor Loh  Dietary issues and exercise activities discussed: Exercise limited by: None identified   Goals Addressed             This Visit's Progress    Patient Stated       Continue exercise at home.        Depression Screen    01/31/2023    3:38 PM 06/17/2022    9:26 AM 06/16/2022    8:28 AM 06/03/2021    3:09 PM 09/10/2020   10:31 AM 04/28/2020    3:45 PM 02/26/2020   10:08 AM  PHQ 2/9 Scores  PHQ - 2 Score 0 0 0 0 0  0 0   PHQ- 9 Score  0 0        Fall Risk    01/31/2023    3:39 PM 01/12/2023   10:24 AM 06/17/2022    9:24 AM 06/16/2022    8:27 AM 06/03/2021    3:07 PM  Fall Risk   Falls in the past year? 0 0 0 0 0  Number falls in past yr: 0 0 0 0 0  Injury with Fall? 0  0 0 0  Risk for fall due to : No Fall Risks  No Fall Risks No Fall Risks No Fall Risks  Follow up Falls prevention discussed  Falls prevention discussed Falls evaluation completed Falls evaluation completed    FALL RISK PREVENTION PERTAINING TO THE HOME:  Any stairs in or around the home? Yes  If so, are there any without handrails? No  Home free of loose throw rugs in walkways, pet beds, electrical cords, etc? Yes  Adequate lighting in your home to reduce risk of falls? Yes   ASSISTIVE DEVICES UTILIZED TO PREVENT FALLS:  Life alert? No  Use of a cane, walker or w/c? No  Grab bars in the bathroom? No  Shower chair or bench in shower? Yes Elevated toilet seat or a handicapped toilet? No   TIMED UP AND GO:  Was the test performed? No . Audio Visit   Cognitive Function:        01/31/2023    3:40 PM 06/17/2022    9:27 AM 02/26/2020   10:06 AM 02/25/2019    2:11 PM 05/05/2017    9:32 AM  6CIT Screen  What Year? 0 points 0 points 0 points 0 points 0 points  What month? 0 points 0 points 0 points 0 points 0 points  What time? 0 points 0 points 0 points 0 points 0 points  Count back from 20 0 points 0 points 0 points 0 points 0 points  Months in reverse 0 points 0 points 0 points 0 points 0 points  Repeat phrase 0 points 0 points 0 points 0 points 0 points  Total Score 0 points 0 points 0 points 0 points 0 points    Immunizations Immunization History  Administered Date(s) Administered   Fluad Quad(high Dose 65+) 05/29/2019, 06/08/2021, 06/16/2022   Influenza, High Dose Seasonal PF 06/18/2018   Influenza,inj,Quad PF,6+ Mos 06/19/2013   Influenza,inj,quad, With Preservative 04/29/2018, 05/02/2019   Influenza-Unspecified  06/15/2015, 06/15/2016, 07/04/2017, 06/09/2020   Moderna Sars-Covid-2 Vaccination 10/11/2019, 11/10/2019, 07/10/2020   Pneumococcal Conjugate-13 09/08/2015   Pneumococcal Polysaccharide-23 11/02/2013   Zoster Recombinat (Shingrix) 06/02/2018, 08/08/2018   Zoster, Live 06/28/2016    TDAP status: Due, Education has been provided regarding the importance of this vaccine. Advised may receive this vaccine at local pharmacy or Health Dept. Aware to provide a copy of the vaccination record if obtained from local pharmacy or Health Dept. Verbalized acceptance and understanding.  Flu Vaccine status: Up to date  Pneumococcal vaccine status: Up to date  Covid-19 vaccine status: Completed vaccines  Qualifies for Shingles Vaccine? Yes   Zostavax completed Yes   Shingrix Completed?: Yes  Screening Tests Health Maintenance  Topic Date Due   DTaP/Tdap/Td (1 - Tdap) Never done   COVID-19 Vaccine (4 - 2023-24 season) 02/16/2023 (Originally 04/29/2022)   INFLUENZA VACCINE  03/30/2023   MAMMOGRAM  11/03/2023   Medicare Annual Wellness (AWV)  01/31/2024   Pneumonia Vaccine 44+ Years old  Completed   DEXA SCAN  Completed   Hepatitis  C Screening  Completed   Zoster Vaccines- Shingrix  Completed   HPV VACCINES  Aged Out   Colonoscopy  Discontinued    Health Maintenance  Health Maintenance Due  Topic Date Due   DTaP/Tdap/Td (1 - Tdap) Never done    Colorectal cancer screening: No longer required.   Mammogram status: Completed 11/03/22. Repeat every year  Bone Density status: Completed 10/22/21. Results reflect: Bone density results: OSTEOPOROSIS. Repeat every   years.  Lung Cancer Screening: (Low Dose CT Chest recommended if Age 49-80 years, 30 pack-year currently smoking OR have quit w/in 15years.) does not qualify.     Additional Screening:  Hepatitis C Screening: does qualify; Completed /4/19  Vision Screening: Recommended annual ophthalmology exams for early detection of glaucoma and  other disorders of the eye. Is the patient up to date with their annual eye exam?  Yes  Who is the provider or what is the name of the office in which the patient attends annual eye exams? Dr Honor Loh If pt is not established with a provider, would they like to be referred to a provider to establish care? No .   Dental Screening: Recommended annual dental exams for proper oral hygiene  Community Resource Referral / Chronic Care Management:  CRR required this visit?  No   CCM required this visit?  No      Plan:     I have personally reviewed and noted the following in the patient's chart:   Medical and social history Use of alcohol, tobacco or illicit drugs  Current medications and supplements including opioid prescriptions. Patient is not currently taking opioid prescriptions. Functional ability and status Nutritional status Physical activity Advanced directives List of other physicians Hospitalizations, surgeries, and ER visits in previous 12 months Vitals Screenings to include cognitive, depression, and falls Referrals and appointments  In addition, I have reviewed and discussed with patient certain preventive protocols, quality metrics, and best practice recommendations. A written personalized care plan for preventive services as well as general preventive health recommendations were provided to patient.     Tillie Rung, LPN   09/03/1094   Nurse Notes: None

## 2023-03-20 ENCOUNTER — Telehealth: Payer: Self-pay | Admitting: *Deleted

## 2023-03-20 NOTE — Telephone Encounter (Signed)
I connected with Amanda Curry on 7/22 at 1544 by telephone and verified that I am speaking with the correct person using two identifiers. According to the patient's chart they are due for physical with [facility]. Pt scheduled. There are no transportation issues at this time. Nothing further was needed at the end of our conversation.

## 2023-03-24 DIAGNOSIS — H2513 Age-related nuclear cataract, bilateral: Secondary | ICD-10-CM | POA: Diagnosis not present

## 2023-03-24 DIAGNOSIS — H52203 Unspecified astigmatism, bilateral: Secondary | ICD-10-CM | POA: Diagnosis not present

## 2023-03-30 ENCOUNTER — Other Ambulatory Visit: Payer: Self-pay | Admitting: Gastroenterology

## 2023-05-24 ENCOUNTER — Encounter: Payer: Self-pay | Admitting: Pharmacist

## 2023-05-24 NOTE — Progress Notes (Signed)
Pharmacy Quality Measure Review  This patient is appearing on a report for being at risk of failing the adherence measure for cholesterol (statin) medications this calendar year.   Medication: pravastatin 10 mg daily Last fill date: 04/24/2023 for 90 day supply  Insurance report was not up to date. No action needed at this time.    Arbutus Leas, PharmD, BCPS [facility] [phone]

## 2023-06-20 ENCOUNTER — Encounter: Payer: PPO | Admitting: Internal Medicine

## 2023-06-23 ENCOUNTER — Ambulatory Visit (INDEPENDENT_AMBULATORY_CARE_PROVIDER_SITE_OTHER): Payer: PPO | Admitting: Internal Medicine

## 2023-06-23 ENCOUNTER — Encounter: Payer: Self-pay | Admitting: Internal Medicine

## 2023-06-23 VITALS — BP 132/82 | HR 88 | Temp 98.2°F | Ht 61.0 in | Wt 133.0 lb

## 2023-06-23 DIAGNOSIS — Z23 Encounter for immunization: Secondary | ICD-10-CM | POA: Diagnosis not present

## 2023-06-23 DIAGNOSIS — Z0001 Encounter for general adult medical examination with abnormal findings: Secondary | ICD-10-CM

## 2023-06-23 DIAGNOSIS — Z0000 Encounter for general adult medical examination without abnormal findings: Secondary | ICD-10-CM | POA: Diagnosis not present

## 2023-06-23 DIAGNOSIS — I10 Essential (primary) hypertension: Secondary | ICD-10-CM

## 2023-06-23 DIAGNOSIS — E782 Mixed hyperlipidemia: Secondary | ICD-10-CM

## 2023-06-23 DIAGNOSIS — R7303 Prediabetes: Secondary | ICD-10-CM | POA: Diagnosis not present

## 2023-06-23 LAB — CBC
HCT: 38.3 % (ref 36.0–46.0)
Hemoglobin: 12.3 g/dL (ref 12.0–15.0)
MCHC: 32.1 g/dL (ref 30.0–36.0)
MCV: 89.2 fL (ref 78.0–100.0)
Platelets: 431 10*3/uL — ABNORMAL HIGH (ref 150.0–400.0)
RBC: 4.3 Mil/uL (ref 3.87–5.11)
RDW: 14 % (ref 11.5–15.5)
WBC: 7.7 10*3/uL (ref 4.0–10.5)

## 2023-06-23 LAB — COMPREHENSIVE METABOLIC PANEL
ALT: 14 U/L (ref 0–35)
AST: 20 U/L (ref 0–37)
Albumin: 4.3 g/dL (ref 3.5–5.2)
Alkaline Phosphatase: 71 U/L (ref 39–117)
BUN: 21 mg/dL (ref 6–23)
CO2: 28 meq/L (ref 19–32)
Calcium: 9.4 mg/dL (ref 8.4–10.5)
Chloride: 99 meq/L (ref 96–112)
Creatinine, Ser: 0.87 mg/dL (ref 0.40–1.20)
GFR: 65.51 mL/min (ref >60.00)
Glucose, Bld: 104 mg/dL — ABNORMAL HIGH (ref 70–99)
Potassium: 5.1 meq/L (ref 3.5–5.1)
Sodium: 133 meq/L — ABNORMAL LOW (ref 135–145)
Total Bilirubin: 0.4 mg/dL (ref 0.2–1.2)
Total Protein: 7 g/dL (ref 6.0–8.3)

## 2023-06-23 LAB — LIPID PANEL
Cholesterol: 213 mg/dL — ABNORMAL HIGH (ref 0–200)
HDL: 66.7 mg/dL (ref >39.00)
LDL Cholesterol: 118 mg/dL — ABNORMAL HIGH (ref 0–99)
NonHDL: 146.21
Total CHOL/HDL Ratio: 3
Triglycerides: 139 mg/dL (ref 0.0–149.0)
VLDL: 27.8 mg/dL (ref 0.0–40.0)

## 2023-06-23 LAB — HEMOGLOBIN A1C: Hgb A1c MFr Bld: 6.2 % (ref 4.6–6.5)

## 2023-06-23 MED ORDER — SPIRONOLACTONE 50 MG PO TABS: 50.0000 mg | ORAL_TABLET | Freq: Every day | ORAL | 3 refills | Status: DC

## 2023-06-23 MED ORDER — FAMOTIDINE 40 MG PO TABS: 40.0000 mg | ORAL_TABLET | Freq: Every day | ORAL | 3 refills | Status: DC

## 2023-06-23 MED ORDER — PRAVASTATIN SODIUM 10 MG PO TABS: 10.0000 mg | ORAL_TABLET | Freq: Every day | ORAL | 3 refills | Status: DC

## 2023-06-23 NOTE — Assessment & Plan Note (Signed)
Checking HgA1c and adjust as needed.  

## 2023-06-23 NOTE — Assessment & Plan Note (Signed)
BP at goal on spironolactone 50 mg daily. Checking CMP and CBC and adjust as needed.

## 2023-06-23 NOTE — Assessment & Plan Note (Signed)
Checking lipid panel and adjust pravastatin 10 mg daily as needed.

## 2023-06-23 NOTE — Progress Notes (Signed)
Subjective:   Patient ID: Amanda Curry, female    DOB: 1950/11/18, 72 y.o.   MRN: [id number]  HPI The patient is here for physical.  PMH, [facility], social history reviewed and updated  Review of Systems  Constitutional: Negative.   HENT: Negative.    Eyes: Negative.   Respiratory:  Negative for cough, chest tightness and shortness of breath.   Cardiovascular:  Negative for chest pain, palpitations and leg swelling.  Gastrointestinal:  Negative for abdominal distention, abdominal pain, constipation, diarrhea, nausea and vomiting.  Musculoskeletal: Negative.   Skin: Negative.   Neurological: Negative.   Psychiatric/Behavioral: Negative.      Objective:  Physical Exam Constitutional:      Appearance: She is well-developed.  HENT:     Head: Normocephalic and atraumatic.  Cardiovascular:     Rate and Rhythm: Normal rate and regular rhythm.  Pulmonary:     Effort: Pulmonary effort is normal. No respiratory distress.     Breath sounds: Normal breath sounds. No wheezing or rales.  Abdominal:     General: Bowel sounds are normal. There is no distension.     Palpations: Abdomen is soft.     Tenderness: There is no abdominal tenderness. There is no rebound.  Musculoskeletal:     Cervical back: Normal range of motion.  Skin:    General: Skin is warm and dry.  Neurological:     Mental Status: She is alert and oriented to person, place, and time.     Coordination: Coordination normal.     Vitals:   06/23/23 1110 06/23/23 1112 06/23/23 1131  BP: (!) 136/100 (!) 136/100 132/82  Pulse: 88    Temp: 98.2 F (36.8 C)    TempSrc: Oral    SpO2: 96%    Weight: 133 lb (60.3 kg)    Height: 5\' 1"  (1.549 m)      Assessment & Plan:  Flu shot given at visit

## 2023-06-23 NOTE — Assessment & Plan Note (Signed)
Flu shot given. Pneumonia complete. Shingrix complete. Tetanus due at pharmacy. Colonoscopy aged out prior to recall. Mammogram due 2025, pap smear aged out and dexa complete. Counseled about sun safety and mole surveillance. Counseled about the dangers of distracted driving. Given 10 year screening recommendations.

## 2023-06-26 ENCOUNTER — Encounter: Payer: Self-pay | Admitting: Internal Medicine

## 2023-06-26 NOTE — Telephone Encounter (Signed)
Pharmacy is correct on file

## 2023-06-27 MED ORDER — PRAVASTATIN SODIUM 20 MG PO TABS: 20.0000 mg | ORAL_TABLET | Freq: Every day | ORAL | 3 refills | Status: DC

## 2023-06-30 ENCOUNTER — Encounter: Payer: Self-pay | Admitting: Internal Medicine

## 2023-06-30 MED ORDER — COLESTIPOL HCL 1 G PO TABS: ORAL_TABLET | ORAL | 2 refills | Status: DC

## 2023-07-18 ENCOUNTER — Other Ambulatory Visit: Payer: Self-pay | Admitting: Internal Medicine

## 2023-07-18 DIAGNOSIS — I10 Essential (primary) hypertension: Secondary | ICD-10-CM

## 2023-11-09 DIAGNOSIS — Z1231 Encounter for screening mammogram for malignant neoplasm of breast: Secondary | ICD-10-CM | POA: Diagnosis not present

## 2023-11-09 LAB — HM MAMMOGRAPHY

## 2023-11-10 ENCOUNTER — Encounter: Payer: Self-pay | Admitting: Internal Medicine

## 2023-11-14 DIAGNOSIS — L820 Inflamed seborrheic keratosis: Secondary | ICD-10-CM | POA: Diagnosis not present

## 2023-11-14 DIAGNOSIS — L578 Other skin changes due to chronic exposure to nonionizing radiation: Secondary | ICD-10-CM | POA: Diagnosis not present

## 2023-11-14 DIAGNOSIS — D225 Melanocytic nevi of trunk: Secondary | ICD-10-CM | POA: Diagnosis not present

## 2023-11-14 DIAGNOSIS — L814 Other melanin hyperpigmentation: Secondary | ICD-10-CM | POA: Diagnosis not present

## 2023-11-14 DIAGNOSIS — Z85828 Personal history of other malignant neoplasm of skin: Secondary | ICD-10-CM | POA: Diagnosis not present

## 2023-11-14 DIAGNOSIS — D2271 Melanocytic nevi of right lower limb, including hip: Secondary | ICD-10-CM | POA: Diagnosis not present

## 2023-11-14 DIAGNOSIS — L821 Other seborrheic keratosis: Secondary | ICD-10-CM | POA: Diagnosis not present

## 2023-11-14 DIAGNOSIS — L570 Actinic keratosis: Secondary | ICD-10-CM | POA: Diagnosis not present

## 2023-12-19 DIAGNOSIS — N814 Uterovaginal prolapse, unspecified: Secondary | ICD-10-CM | POA: Diagnosis not present

## 2023-12-29 ENCOUNTER — Encounter: Payer: Self-pay | Admitting: Internal Medicine

## 2024-01-05 ENCOUNTER — Encounter: Payer: Self-pay | Admitting: Internal Medicine

## 2024-01-05 MED ORDER — ESTRADIOL 0.1 MG/GM VA CREA: 1.0000 | TOPICAL_CREAM | VAGINAL | 11 refills | Status: AC

## 2024-02-06 ENCOUNTER — Ambulatory Visit (INDEPENDENT_AMBULATORY_CARE_PROVIDER_SITE_OTHER): Payer: PPO

## 2024-02-06 VITALS — Ht 61.0 in | Wt 131.2 lb

## 2024-02-06 DIAGNOSIS — Z0000 Encounter for general adult medical examination without abnormal findings: Secondary | ICD-10-CM | POA: Diagnosis not present

## 2024-02-06 NOTE — Progress Notes (Signed)
 Subjective:   Amanda Curry is a 73 y.o. who presents for a Medicare Wellness preventive visit.  As a reminder, Annual Wellness Visits don't include a physical exam, and some assessments may be limited, especially if this visit is performed virtually. We may recommend an in-person follow-up visit with your provider if needed.  Visit Complete: Virtual I connected with  Amanda Curry on 02/06/24 by a audio enabled telemedicine application and verified that I am speaking with the correct person using two identifiers.  Patient Location: Home  Provider Location: Office/Clinic  I discussed the limitations of evaluation and management by telemedicine. The patient expressed understanding and agreed to proceed.  Vital Signs: Because this visit was a virtual/telehealth visit, some criteria may be missing or patient reported. Any vitals not documented were not able to be obtained and vitals that have been documented are patient reported.  VideoDeclined- This patient declined Librarian, academic. Therefore the visit was completed with audio only.  Persons Participating in Visit: Patient.  AWV Questionnaire: No: Patient Medicare AWV questionnaire was not completed prior to this visit.  Cardiac Risk Factors include: advanced age (>77men, >36 women);dyslipidemia;hypertension     Objective:     Today's Vitals   02/06/24 1132  Weight: 131 lb 3.2 oz (59.5 kg)  Height: 5\' 1"  (1.549 m)   Body mass index is 24.79 kg/m.     02/06/2024   11:31 AM 01/31/2023    3:40 PM 06/17/2022    9:27 AM 06/03/2021    3:05 PM 02/26/2020   10:06 AM 01/27/2020   10:05 PM 01/20/2020    6:04 AM  Advanced Directives  Does Patient Have a Medical Advance Directive? Yes Yes Yes Yes Yes Yes Yes  Type of Estate agent of [place];Living will Healthcare Power of [place];Living will Healthcare Power of [place];Living will Living will;Healthcare Power of Attorney Living  will;Healthcare Power of [employer] Power of [employer] Power of [place];Living will  Does patient want to make changes to medical advance directive?    No - Patient declined Yes (ED - Information included in AVS) Yes (ED - Information included in AVS) No - Patient declined  Copy of Healthcare Power of Attorney in Chart? No - copy requested No - copy requested No - copy requested No - copy requested No - copy requested No - copy requested No - copy requested  Would patient like information on creating a medical advance directive?      Yes (ED - Information included in AVS)     Current Medications (verified) Outpatient Encounter Medications as of 02/06/2024  Medication Sig   colestipol  (COLESTID ) 1 g tablet TAKE 1 TABLET(1 GRAM) BY MOUTH TWICE DAILY   estradiol  (ESTRACE ) 0.1 MG/GM vaginal cream Place 1 Applicatorful vaginally 3 (three) times a week.   hydrocortisone cream 1 % Apply 1 application topically daily as needed for itching.    melatonin 5 MG TABS Take 5 mg by mouth at bedtime.   pravastatin  (PRAVACHOL ) 20 MG tablet Take 1 tablet (20 mg total) by mouth daily.   pseudoephedrine  (SUDAFED) 30 MG tablet Take 30 mg by mouth every 4 (four) hours as needed for congestion.   psyllium (METAMUCIL) 58.6 % powder Take 1 packet by mouth 3 (three) times daily.   simethicone (MYLICON) 80 MG chewable tablet Chew 80 mg by mouth every 6 (six) hours as needed for flatulence.   spironolactone  (ALDACTONE ) 50 MG tablet Take 1 tablet (50 mg total) by mouth  daily.   triamcinolone  cream (KENALOG ) 0.1 % Apply 1 application topically daily as needed (eczema).   [DISCONTINUED] famotidine  (PEPCID ) 40 MG tablet Take 1 tablet (40 mg total) by mouth at bedtime.   No facility-administered encounter medications on file as of 02/06/2024.    Allergies (verified) Hctz [hydrochlorothiazide ], Epinephrine , Novocain [procaine], Olmesartan , Amlodipine , Lidocaine -epinephrine  (pf), and Other   History: Past  Medical History:  Diagnosis Date   Adenomatous polyps    Arthritis    Diverticulosis    Ganglion of left wrist    thumb joint   GERD (gastroesophageal reflux disease)    Hyperlipidemia    Hypertension    Pre-diabetes    Tinnitus    Past Surgical History:  Procedure Laterality Date   ABDOMINAL HYSTERECTOMY N/A    Phreesia 02/23/2020   ANTERIOR AND POSTERIOR REPAIR WITH SACROSPINOUS FIXATION N/A 01/20/2020   Procedure: ANTERIOR AND POSTERIOR REPAIR WITH SACROSPINOUS FIXATION;  Surgeon: Merryl Abraham, MD;  Location: [facility] [place];  Service: Gynecology;  Laterality: N/A;   COLONOSCOPY     x 3   CYSTOSCOPY N/A 01/20/2020   Procedure: CYSTOSCOPY;  Surgeon: Merryl Abraham, MD;  Location: [facility];  Service: Gynecology;  Laterality: N/A;   JOINT REPLACEMENT Right 05/2019   LAPAROSCOPIC VAGINAL HYSTERECTOMY WITH SALPINGO OOPHORECTOMY Bilateral 01/20/2020   Procedure: LAPAROSCOPIC ASSISTED VAGINAL HYSTERECTOMY WITH SALPINGO OOPHORECTOMY;  Surgeon: Merryl Abraham, MD;  Location: [facility];  Service: Gynecology;  Laterality: Bilateral;  NEED BED   POLYPECTOMY     right trigger finger correction surgery     TONSILLECTOMY     TOTAL KNEE ARTHROPLASTY Right 06/24/2019   Procedure: TOTAL KNEE ARTHROPLASTY;  Surgeon: Liliane Rei, MD;  Location: [facility];  Service: Orthopedics;  Laterality: Right;    TUBAL LIGATION     Family History  Problem Relation Age of Onset   Colon cancer Maternal Grandmother    Cancer Maternal Grandmother        colon   Prostate cancer Father    Cancer Father        prostate   Alzheimer's disease Mother    Hyperlipidemia Mother    Hypertension Mother    Heart disease Paternal Grandmother    Heart disease Paternal Grandfather    Hypertension Sister    Colon polyps Neg Hx    Esophageal cancer Neg Hx    Rectal cancer Neg Hx    Stomach cancer Neg Hx    Social History   Socioeconomic History   Marital status:  Married    Spouse name: Ethelle Herb   Number of children: 4   Years of education: Not on file   Highest education level: Bachelor's degree (e.g., BA, AB, BS)  Occupational History   Occupation: 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   Adult children. Two live in [place].   Social Drivers of Corporate investment banker Strain: Low Risk  (02/06/2024)   Overall Financial Resource Strain (CARDIA)    Difficulty of Paying Living Expenses: Not hard at all  Food Insecurity: No Food Insecurity (02/06/2024)  Hunger Vital Sign    Worried About Running Out of Food in the Last Year: Never true    Ran Out of Food in the Last Year: Never true  Transportation Needs: No Transportation Needs (02/06/2024)   PRAPARE - Administrator, Civil Service (Medical): No    Lack of Transportation (Non-Medical): No  Physical Activity: Sufficiently Active (02/06/2024)   Exercise Vital Sign    Days of Exercise per Week: 7 days    Minutes of Exercise per Session: 30 min  Stress: No Stress Concern Present (02/06/2024)   [employer] of Occupational Health - Occupational Stress Questionnaire    Feeling of Stress : Not at all  Social Connections: Socially Integrated (02/06/2024)   Social Connection and Isolation Panel [NHANES]    Frequency of Communication with Friends and Family: More than three times a week    Frequency of Social Gatherings with Friends and Family: Three times a week    Attends Religious Services: More than 4 times per year    Active Member of Clubs or Organizations: Yes    Attends Engineer, structural: More than 4  times per year    Marital Status: Married    Tobacco Counseling Counseling given: No    Clinical Intake:  Pre-visit preparation completed: Yes  Pain : No/denies pain     BMI - recorded: 24.79 Nutritional Status: BMI of 19-24  Normal Nutritional Risks: None Diabetes: No  Lab Results  Component Value Date   HGBA1C 6.2 06/23/2023   HGBA1C 6.2 06/16/2022   HGBA1C 6.0 (A) 01/22/2021     How often do you need to have someone help you when you read instructions, pamphlets, or other written materials from your doctor or pharmacy?: 1 - Never  Interpreter Needed?: No  Information entered by :: Kandy Orris, CMA   Activities of Daily Living    02/06/2024   11:34 AM  In your present state of health, do you have any difficulty performing the following activities:  Hearing? 0  Vision? 0  Difficulty concentrating or making decisions? 0  Walking or climbing stairs? 0  Dressing or bathing? 0  Doing errands, shopping? 0  Preparing Food and eating ? N  Using the Toilet? N  In the past six months, have you accidently leaked urine? N  Do you have problems with loss of bowel control? N  Managing your Medications? N  Managing your Finances? N  Housekeeping or managing your Housekeeping? N    Patient Care Team: Adelia Homestead, MD as PCP - General (Internal Medicine) Merryl Abraham, MD as Consulting Physician (Obstetrics and Gynecology) Stafford Eagles, MD as Attending Physician (Family Medicine) Dema Filler, MD as Consulting Physician (Ophthalmology)  I have updated your Care Teams any recent Medical Services you may have received from other providers in the past year.     Assessment:    This is a routine wellness examination for Amanda Curry.  Hearing/Vision screen Hearing Screening - Comments:: Denies hearing difficulties   Vision Screening - Comments:: Wears rx glasses - appt w/ Dr Juanito Norma in 03/2024   Goals Addressed               This Visit's Progress      Increase physical activity (pt-stated)        Patient stated that she will continue to walk - exercise        Depression Screen     02/06/2024   11:36 AM 01/31/2023  3:38 PM 06/17/2022    9:26 AM 06/16/2022    8:28 AM 06/03/2021    3:09 PM 09/10/2020   10:31 AM 04/28/2020    3:45 PM  PHQ 2/9 Scores  PHQ - 2 Score 0 0 0 0 0 0 0  PHQ- 9 Score 0  0 0       Fall Risk     02/06/2024   11:35 AM 06/23/2023   11:12 AM 01/31/2023    3:39 PM 01/12/2023   10:24 AM 06/17/2022    9:24 AM  Fall Risk   Falls in the past year? 0 0 0 0 0  Number falls in past yr: 0 0 0 0 0  Injury with Fall? 0 0 0  0  Risk for fall due to : No Fall Risks  No Fall Risks  No Fall Risks  Follow up Falls evaluation completed;Falls prevention discussed Falls evaluation completed Falls prevention discussed  Falls prevention discussed    MEDICARE RISK AT HOME:  Medicare Risk at Home Any stairs in or around the home?: Yes If so, are there any without handrails?: No Home free of loose throw rugs in walkways, pet beds, electrical cords, etc?: Yes Adequate lighting in your home to reduce risk of falls?: Yes Life alert?: No Use of a cane, walker or w/c?: No Grab bars in the bathroom?: Yes Shower chair or bench in shower?: Yes Elevated toilet seat or a handicapped toilet?: No  TIMED UP AND GO:  Was the test performed?  No  Cognitive Function: 6CIT completed        02/06/2024   11:38 AM 01/31/2023    3:40 PM 06/17/2022    9:27 AM 02/26/2020   10:06 AM 02/25/2019    2:11 PM  6CIT Screen  What Year? 0 points 0 points 0 points 0 points 0 points  What month? 0 points 0 points 0 points 0 points 0 points  What time? 0 points 0 points 0 points 0 points 0 points  Count back from 20 0 points 0 points 0 points 0 points 0 points  Months in reverse 0 points 0 points 0 points 0 points 0 points  Repeat phrase 0 points 0 points 0 points 0 points 0 points  Total Score 0 points 0 points 0 points 0 points 0 points     Immunizations Immunization History  Administered Date(s) Administered   Fluad Quad(high Dose 65+) 05/29/2019, 06/08/2021, 06/16/2022   Fluad Trivalent(High Dose 65+) 06/23/2023   Influenza, High Dose Seasonal PF 06/18/2018   Influenza,inj,Quad PF,6+ Mos 06/19/2013   Influenza,inj,quad, With Preservative 04/29/2018, 05/02/2019   Influenza-Unspecified 06/15/2015, 06/15/2016, 07/04/2017, 06/09/2020   Moderna Sars-Covid-2 Vaccination 10/11/2019, 11/10/2019, 07/10/2020   Pneumococcal Conjugate-13 09/08/2015   Pneumococcal Polysaccharide-23 11/02/2013   Zoster Recombinant(Shingrix) 06/02/2018, 08/08/2018   Zoster, Live 06/28/2016    Screening Tests Health Maintenance  Topic Date Due   DTaP/Tdap/Td (1 - Tdap) Never done   COVID-19 Vaccine (4 - 2024-25 season) 04/30/2023   INFLUENZA VACCINE  03/29/2024   MAMMOGRAM  11/08/2024   Medicare Annual Wellness (AWV)  02/05/2025   Pneumonia Vaccine 67+ Years old  Completed   DEXA SCAN  Completed   Hepatitis C Screening  Completed   Zoster Vaccines- Shingrix  Completed   HPV VACCINES  Aged Out   Meningococcal B Vaccine  Aged Out   Colonoscopy  Discontinued    Health Maintenance  Health Maintenance Due  Topic Date Due   DTaP/Tdap/Td (1 - Tdap) Never done  COVID-19 Vaccine (4 - 2024-25 season) 04/30/2023   Health Maintenance Items Addressed:02/06/2024   Additional Screening:  Vision Screening: Recommended annual ophthalmology exams for early detection of glaucoma and other disorders of the eye. Pt stated she has an appt w/Dr McCuen in 03/2024.  Dental Screening: Recommended annual dental exams for proper oral hygiene  Community Resource Referral / Chronic Care Management: CRR required this visit?  No   CCM required this visit?  No   Plan:    I have personally reviewed and noted the following in the patient's chart:   Medical and social history Use of alcohol, tobacco or illicit drugs  Current medications and  supplements including opioid prescriptions. Patient is not currently taking opioid prescriptions. Functional ability and status Nutritional status Physical activity Advanced directives List of other physicians Hospitalizations, surgeries, and ER visits in previous 12 months Vitals Screenings to include cognitive, depression, and falls Referrals and appointments  In addition, I have reviewed and discussed with patient certain preventive protocols, quality metrics, and best practice recommendations. A written personalized care plan for preventive services as well as general preventive health recommendations were provided to patient.   Patria Bookbinder, CMA   02/06/2024   After Visit Summary: (MyChart) Due to this being a telephonic visit, the after visit summary with patients personalized plan was offered to patient via MyChart   Notes: Nothing significant to report at this time.

## 2024-02-06 NOTE — Patient Instructions (Signed)
 [name] , Thank you for taking time out of your busy schedule to complete your Annual Wellness Visit with me. I enjoyed our conversation and look forward to speaking with you again next year. I, as well as your care team,  appreciate your ongoing commitment to your health goals. Please review the following plan we discussed and let me know if I can assist you in the future. Your Game plan/ To Do List    Follow up Visits: Next Medicare AWV with our clinical staff: 02/10/2025   Have you seen your provider in the last 6 months (3 months if uncontrolled diabetes)? No Next Office Visit with your provider: 06/26/2024 - Physical  Clinician Recommendations:  Aim for 30 minutes of exercise or brisk walking, 6-8 glasses of water, and 5 servings of fruits and vegetables each day. Educated and advised on getting the Tdap (Tetenus) vaccine at local pharmacy in 2025.      This is a list of the screening recommended for you and due dates:  Health Maintenance  Topic Date Due   DTaP/Tdap/Td vaccine (1 - Tdap) Never done   COVID-19 Vaccine (4 - 2024-25 season) 04/30/2023   Flu Shot  03/29/2024   Mammogram  11/08/2024   Medicare Annual Wellness Visit  02/05/2025   Pneumonia Vaccine  Completed   DEXA scan (bone density measurement)  Completed   Hepatitis C Screening  Completed   Zoster (Shingles) Vaccine  Completed   HPV Vaccine  Aged Out   Meningitis B Vaccine  Aged Out   Colon Cancer Screening  Discontinued    Advanced directives: (Copy Requested) Please bring a copy of your health care power of attorney and living will to the office to be added to your chart at your convenience. You can mail to [facility] [phone] [place], [place] [address] or email to [email or web address] Advance Care Planning is important because it:  [x]  Makes sure you receive the medical care that is consistent with your values, goals, and preferences  [x]  It provides guidance to your family and  loved ones and reduces their decisional burden about whether or not they are making the right decisions based on your wishes.  Follow the link provided in your after visit summary or read over the paperwork we have mailed to you to help you started getting your Advance Directives in place. If you need assistance in completing these, please reach out to us  so that we can help you!

## 2024-02-29 ENCOUNTER — Encounter: Payer: Self-pay | Admitting: Podiatry

## 2024-02-29 ENCOUNTER — Ambulatory Visit: Admitting: Podiatry

## 2024-02-29 DIAGNOSIS — M19071 Primary osteoarthritis, right ankle and foot: Secondary | ICD-10-CM | POA: Diagnosis not present

## 2024-02-29 NOTE — Progress Notes (Signed)
 She presents today wondering if she is starting to develop more arthritis.  She says I really do not need any shots I do not need any medicine I just want to know that that is what this is.  She is referring to the thickening of the first tarsometatarsal joint of the right foot.  Objective: Vital signs are stable alert oriented x 3.  Pulses are palpable.  Mild pes planovalgus with obvious osteoarthritis on palpation at the first tarsometatarsal joint.  Considerable spurring and thickening of the tissue nonpulsatile in nature.  Assessment: Osteoarthritis with pes planovalgus right.  Plan: Discussed etiology pathology conservative surgical therapies we will follow-up with her on as-needed basis

## 2024-03-28 ENCOUNTER — Ambulatory Visit: Admitting: Podiatry

## 2024-04-02 DIAGNOSIS — H2513 Age-related nuclear cataract, bilateral: Secondary | ICD-10-CM | POA: Diagnosis not present

## 2024-04-02 DIAGNOSIS — H5213 Myopia, bilateral: Secondary | ICD-10-CM | POA: Diagnosis not present

## 2024-04-13 ENCOUNTER — Other Ambulatory Visit: Payer: Self-pay | Admitting: Internal Medicine

## 2024-05-13 ENCOUNTER — Ambulatory Visit (INDEPENDENT_AMBULATORY_CARE_PROVIDER_SITE_OTHER): Admitting: Family Medicine

## 2024-05-13 VITALS — BP 142/86 | HR 90 | Temp 98.3°F | Resp 20 | Ht 61.0 in | Wt 131.0 lb

## 2024-05-13 DIAGNOSIS — F439 Reaction to severe stress, unspecified: Secondary | ICD-10-CM | POA: Diagnosis not present

## 2024-05-13 MED ORDER — CITALOPRAM HYDROBROMIDE 10 MG PO TABS: 10.0000 mg | ORAL_TABLET | Freq: Every day | ORAL | 2 refills | Status: DC

## 2024-05-13 NOTE — Progress Notes (Signed)
 Assessment & Plan Situational stress Anxiety worsened, likely situational. No prior treatment. Possible overlap with depression. - Start Celexa  at low dose. Full effect in six weeks, some improvement in two weeks. Explained medication is not lifelong; can discuss taper after a year. Warned against abrupt discontinuation. - Provided mindfulness-based stress reduction exercises. Orders:   citalopram  (CELEXA ) 10 MG tablet; Take 1 tablet (10 mg total) by mouth daily.    Follow up plan: Return for as scheduled with PCP.  Niki Rung, MSN, APRN, FNP-C  Subjective:  HPI: Amanda Curry is a 73 y.o. female presenting on 05/13/2024 for Stress (A lot going on at home: worries a lot, effecting sleep and appetite /Started getting worse in the last month )  Discussed the use of AI scribe software for clinical note transcription with the patient, who gave verbal consent to proceed.  History of Present Illness   Amanda Curry is a 73 year old female who presents with increased anxiety and associated symptoms.  She has been experiencing increased anxiety over the past month, which she attributes to stress from home renovations. The anxiety is described as 'free floating,' with worries about various aspects of life without a specific cause.  She has noticed a decrease in appetite, stating that her anxiety 'breaks my appetite,' leading her to eat less. Additionally, she reports difficulty sleeping, which she associates with her heightened anxiety.  She denies any history of counseling or therapy for anxiety and has not taken any medication for anxiety or panic attacks in the past, although she experienced panic attacks during college years ago.  She denies depression and reports only anxiety. No history of medication for anxiety or depression.  During the review of symptoms, she confirms that her anxiety is higher than usual. She reports that her blood pressure tends to rise when measured.         05/13/2024   12:58 PM  GAD 7 : Generalized Anxiety Score  Nervous, Anxious, on Edge 3  Control/stop worrying 3  Worry too much - different things 3  Trouble relaxing 3  Restless 3  Easily annoyed or irritable 0  Afraid - awful might happen 2  Total GAD 7 Score 17  Anxiety Difficulty Somewhat difficult      05/13/2024   12:57 PM 02/06/2024   11:36 AM 01/31/2023    3:38 PM  Depression screen PHQ 2/9  Decreased Interest 2 0 0  Down, Depressed, Hopeless 1 0 0  PHQ - 2 Score 3 0 0  Altered sleeping 2 0   Tired, decreased energy 1 0   Change in appetite 2 0   Feeling bad or failure about yourself  0 0   Trouble concentrating 1 0   Moving slowly or fidgety/restless 0 0   Suicidal thoughts 0 0   PHQ-9 Score 9 0   Difficult doing work/chores Somewhat difficult Not difficult at all      ROS: Negative unless specifically indicated above in HPI.   Relevant past medical history reviewed and updated as indicated.   Allergies and medications reviewed and updated.   Current Outpatient Medications:    citalopram  (CELEXA ) 10 MG tablet, Take 1 tablet (10 mg total) by mouth daily., Disp: 30 tablet, Rfl: 2   colestipol  (COLESTID ) 1 g tablet, TAKE 1 TABLET(1 GRAM) BY MOUTH TWICE DAILY, Disp: 180 tablet, Rfl: 2   estradiol  (ESTRACE ) 0.1 MG/GM vaginal cream, Place 1 Applicatorful vaginally 3 (three) times a week., Disp: 42.5 g, Rfl:  11   hydrocortisone cream 1 %, Apply 1 application topically daily as needed for itching. , Disp: , Rfl:    melatonin 5 MG TABS, Take 5 mg by mouth at bedtime., Disp: , Rfl:    pravastatin  (PRAVACHOL ) 20 MG tablet, Take 1 tablet (20 mg total) by mouth daily., Disp: 90 tablet, Rfl: 3   pseudoephedrine  (SUDAFED) 30 MG tablet, Take 30 mg by mouth every 4 (four) hours as needed for congestion., Disp: , Rfl:    psyllium (METAMUCIL) 58.6 % powder, Take 1 packet by mouth 3 (three) times daily., Disp: , Rfl:    simethicone (MYLICON) 80 MG chewable tablet, Chew 80 mg by mouth  every 6 (six) hours as needed for flatulence., Disp: , Rfl:    triamcinolone  cream (KENALOG ) 0.1 %, Apply 1 application topically daily as needed (eczema)., Disp: 30 g, Rfl: 1   spironolactone  (ALDACTONE ) 50 MG tablet, Take 1 tablet (50 mg total) by mouth daily., Disp: 90 tablet, Rfl: 3  Allergies  Allergen Reactions   Hctz [Hydrochlorothiazide ] Other (See Comments)    Causes extreme hyponatremia quickly - 126   Epinephrine  Other (See Comments)    Brief trembling sensation    Novocain [Procaine]     makes me tremble    Olmesartan     Amlodipine  Swelling    Mild but chronic lower extremity edema   Lidocaine -Epinephrine  (Pf) Anxiety   Other     Objective:   BP (!) 142/86   Pulse 90   Temp 98.3 F (36.8 C)   Resp 20   Ht 5' 1 (1.549 m)   Wt 131 lb (59.4 kg)   SpO2 98%   BMI 24.75 kg/m    Physical Exam Vitals reviewed.  Constitutional:      General: She is not in acute distress.    Appearance: Normal appearance. She is not ill-appearing, toxic-appearing or diaphoretic.  HENT:     Head: Normocephalic and atraumatic.  Eyes:     General: No scleral icterus.       Right eye: No discharge.        Left eye: No discharge.     Conjunctiva/sclera: Conjunctivae normal.  Cardiovascular:     Rate and Rhythm: Normal rate.  Pulmonary:     Effort: Pulmonary effort is normal. No respiratory distress.  Musculoskeletal:        General: Normal range of motion.     Cervical back: Normal range of motion.  Skin:    General: Skin is warm and dry.     Capillary Refill: Capillary refill takes less than 2 seconds.  Neurological:     General: No focal deficit present.     Mental Status: She is alert and oriented to person, place, and time. Mental status is at baseline.  Psychiatric:        Mood and Affect: Mood normal.        Behavior: Behavior normal.        Thought Content: Thought content normal.        Judgment: Judgment normal.

## 2024-05-20 ENCOUNTER — Encounter: Payer: Self-pay | Admitting: Internal Medicine

## 2024-05-20 MED ORDER — FLUCONAZOLE 150 MG PO TABS: 150.0000 mg | ORAL_TABLET | Freq: Once | ORAL | 0 refills | Status: AC

## 2024-05-20 NOTE — Telephone Encounter (Signed)
Pharmacy is on file

## 2024-06-13 DIAGNOSIS — L570 Actinic keratosis: Secondary | ICD-10-CM | POA: Diagnosis not present

## 2024-06-13 DIAGNOSIS — L853 Xerosis cutis: Secondary | ICD-10-CM | POA: Diagnosis not present

## 2024-06-13 DIAGNOSIS — L821 Other seborrheic keratosis: Secondary | ICD-10-CM | POA: Diagnosis not present

## 2024-06-25 ENCOUNTER — Encounter: Payer: Self-pay | Admitting: Internal Medicine

## 2024-06-26 ENCOUNTER — Encounter: Admitting: Internal Medicine

## 2024-06-26 NOTE — Telephone Encounter (Signed)
 FYI

## 2024-07-03 ENCOUNTER — Encounter: Payer: Self-pay | Admitting: Internal Medicine

## 2024-07-03 ENCOUNTER — Ambulatory Visit: Admitting: Internal Medicine

## 2024-07-03 VITALS — BP 140/80 | HR 113 | Temp 98.6°F | Ht 61.0 in | Wt 134.4 lb

## 2024-07-03 DIAGNOSIS — Z0001 Encounter for general adult medical examination with abnormal findings: Secondary | ICD-10-CM

## 2024-07-03 DIAGNOSIS — Z0000 Encounter for general adult medical examination without abnormal findings: Secondary | ICD-10-CM | POA: Diagnosis not present

## 2024-07-03 DIAGNOSIS — R7303 Prediabetes: Secondary | ICD-10-CM | POA: Diagnosis not present

## 2024-07-03 DIAGNOSIS — E782 Mixed hyperlipidemia: Secondary | ICD-10-CM | POA: Diagnosis not present

## 2024-07-03 DIAGNOSIS — I10 Essential (primary) hypertension: Secondary | ICD-10-CM | POA: Diagnosis not present

## 2024-07-03 DIAGNOSIS — Z23 Encounter for immunization: Secondary | ICD-10-CM

## 2024-07-03 DIAGNOSIS — F419 Anxiety disorder, unspecified: Secondary | ICD-10-CM

## 2024-07-03 LAB — CBC
HCT: 38.7 % (ref 36.0–46.0)
Hemoglobin: 13.1 g/dL (ref 12.0–15.0)
MCHC: 33.8 g/dL (ref 30.0–36.0)
MCV: 88 fl (ref 78.0–100.0)
Platelets: 390 K/uL (ref 150.0–400.0)
RBC: 4.4 Mil/uL (ref 3.87–5.11)
RDW: 13.2 % (ref 11.5–15.5)
WBC: 10.2 K/uL (ref 4.0–10.5)

## 2024-07-03 LAB — HEMOGLOBIN A1C: Hgb A1c MFr Bld: 6.4 % (ref 4.6–6.5)

## 2024-07-03 LAB — LIPID PANEL
Cholesterol: 204 mg/dL — ABNORMAL HIGH (ref 0–200)
HDL: 80.4 mg/dL (ref >39.00)
LDL Cholesterol: 95 mg/dL (ref 0–99)
NonHDL: 123.16
Total CHOL/HDL Ratio: 3
Triglycerides: 141 mg/dL (ref 0.0–149.0)
VLDL: 28.2 mg/dL (ref 0.0–40.0)

## 2024-07-03 LAB — COMPREHENSIVE METABOLIC PANEL WITH GFR
ALT: 16 U/L (ref 0–35)
AST: 19 U/L (ref 0–37)
Albumin: 4.5 g/dL (ref 3.5–5.2)
Alkaline Phosphatase: 61 U/L (ref 39–117)
BUN: 23 mg/dL (ref 6–23)
CO2: 26 meq/L (ref 19–32)
Calcium: 9.2 mg/dL (ref 8.4–10.5)
Chloride: 92 meq/L — ABNORMAL LOW (ref 96–112)
Creatinine, Ser: 0.76 mg/dL (ref 0.40–1.20)
GFR: 76.49 mL/min (ref >60.00)
Glucose, Bld: 115 mg/dL — ABNORMAL HIGH (ref 70–99)
Potassium: 4.6 meq/L (ref 3.5–5.1)
Sodium: 125 meq/L — ABNORMAL LOW (ref 135–145)
Total Bilirubin: 0.4 mg/dL (ref 0.2–1.2)
Total Protein: 7.3 g/dL (ref 6.0–8.3)

## 2024-07-03 MED ORDER — BUSPIRONE HCL 5 MG PO TABS: 5.0000 mg | ORAL_TABLET | Freq: Two times a day (BID) | ORAL | 1 refills | Status: AC

## 2024-07-03 MED ORDER — TRIAMCINOLONE ACETONIDE 0.1 % EX CREA: 1.0000 | TOPICAL_CREAM | Freq: Every day | CUTANEOUS | 1 refills | Status: AC | PRN

## 2024-07-03 NOTE — Assessment & Plan Note (Signed)
Checking lipid panel and adjust as needed pravastatin.

## 2024-07-03 NOTE — Progress Notes (Signed)
   Subjective:   Patient ID: Amanda Curry, female    DOB: 1951/06/17, 73 y.o.   MRN: [id number]  The patient is here for physical. Pertinent topics discussed: Discussed the use of AI scribe software for clinical note transcription with the patient, who gave verbal consent to proceed.  History of Present Illness Amanda Curry is a 73 year old female who presents with anxiety and night sweats.  She has experienced increased anxiety over the past month, feeling constantly on edge. She was prescribed Celexa , which she took for eight days but discontinued due to increased nervousness and paranoia. After stopping the medication, her anxiety persisted, especially in confined spaces like church, where she finds it difficult to sit still. She also experiences anxiety during doctor visits, although she is driving more and working on building her confidence.  She has been experiencing night sweats for about a month, which she associates with her anxiety. She reports that the night sweats started around the time she stopped taking Celexa , but the exact timing is unclear. She recalls similar symptoms after childbirth, attributed to a sympathetic response.  She recently had a dermatology appointment for an itchy spot on her back. She was advised to use Lubriderm or Vaseline, but these treatments have not improved the condition and the itching persists.  She works at [employer] and has been involved in home renovations, which may have contributed to her anxiety. She has been attending a different church due to turmoil in her original church post-COVID, which she believes may have triggered her anxiety.  No chest pain, tightness, breathing troubles, stomach issues, headaches, vision changes, hearing changes, or other skin issues apart from the itchy spot on her back.  PMH, [facility], social history reviewed and updated  Review of Systems  Constitutional: Negative.   HENT: Negative.    Eyes: Negative.    Respiratory:  Negative for cough, chest tightness and shortness of breath.   Cardiovascular:  Negative for chest pain, palpitations and leg swelling.  Gastrointestinal:  Negative for abdominal distention, abdominal pain, constipation, diarrhea, nausea and vomiting.  Musculoskeletal: Negative.   Skin: Negative.   Neurological: Negative.   Psychiatric/Behavioral:  Positive for decreased concentration. The patient is nervous/anxious.     Objective:  Physical Exam Constitutional:      Appearance: She is well-developed.  HENT:     Head: Normocephalic and atraumatic.  Cardiovascular:     Rate and Rhythm: Normal rate and regular rhythm.  Pulmonary:     Effort: Pulmonary effort is normal. No respiratory distress.     Breath sounds: Normal breath sounds. No wheezing or rales.  Abdominal:     General: Bowel sounds are normal. There is no distension.     Palpations: Abdomen is soft.     Tenderness: There is no abdominal tenderness.  Musculoskeletal:     Cervical back: Normal range of motion.  Skin:    General: Skin is warm and dry.  Neurological:     Mental Status: She is alert and oriented to person, place, and time.     Coordination: Coordination normal.     Vitals:   07/03/24 1036 07/03/24 1050  BP: (!) 150/80 (!) 140/80  Pulse: (!) 113   Temp: 98.6 F (37 C)   TempSrc: Oral   SpO2: 99%   Weight: 134 lb 6.4 oz (61 kg)   Height: 5' 1 (1.549 m)     Assessment & Plan:  Flu shot given at visit

## 2024-07-03 NOTE — Assessment & Plan Note (Signed)
 Seems to be related to specific scenarios per her description although she also seems to have some generalized. She did not tolerate citalopram . We will rx buspar 5 mg BID prn to use for anxiety. Counseled about coping skills and counseling as options as well.

## 2024-07-03 NOTE — Assessment & Plan Note (Signed)
 Checking HGA1c and adjust as needed.

## 2024-07-03 NOTE — Patient Instructions (Signed)
 We have sent in the triamcinolone  to use on the back.  We have sent in bupsar (buspirone) to use up to twice a day as needed for the anxiety.

## 2024-07-03 NOTE — Assessment & Plan Note (Signed)
Checking CMP and adjust as needed. BP at goal.  

## 2024-07-03 NOTE — Assessment & Plan Note (Signed)
 Flu shot given at visit. Pneumonia complete. Shingrix complete. Tetanus due at pharmacy. Colonoscopy up to date. Mammogram up to date, pap smear aged out and dexa complete. Counseled about sun safety and mole surveillance. Counseled about the dangers of distracted driving. Given 10 year screening recommendations.

## 2024-07-08 ENCOUNTER — Ambulatory Visit: Payer: Self-pay | Admitting: Internal Medicine

## 2024-07-08 DIAGNOSIS — E871 Hypo-osmolality and hyponatremia: Secondary | ICD-10-CM

## 2024-07-08 MED ORDER — SPIRONOLACTONE 25 MG PO TABS: 25.0000 mg | ORAL_TABLET | Freq: Every day | ORAL | 3 refills | Status: AC

## 2024-07-12 ENCOUNTER — Other Ambulatory Visit: Payer: Self-pay | Admitting: Internal Medicine

## 2024-07-12 DIAGNOSIS — I10 Essential (primary) hypertension: Secondary | ICD-10-CM

## 2024-07-18 ENCOUNTER — Other Ambulatory Visit (INDEPENDENT_AMBULATORY_CARE_PROVIDER_SITE_OTHER)

## 2024-07-18 DIAGNOSIS — E871 Hypo-osmolality and hyponatremia: Secondary | ICD-10-CM | POA: Diagnosis not present

## 2024-07-18 LAB — COMPREHENSIVE METABOLIC PANEL WITH GFR
ALT: 14 U/L (ref 0–35)
AST: 18 U/L (ref 0–37)
Albumin: 4.2 g/dL (ref 3.5–5.2)
Alkaline Phosphatase: 56 U/L (ref 39–117)
BUN: 19 mg/dL (ref 6–23)
CO2: 29 meq/L (ref 19–32)
Calcium: 8.8 mg/dL (ref 8.4–10.5)
Chloride: 97 meq/L (ref 96–112)
Creatinine, Ser: 0.86 mg/dL (ref 0.40–1.20)
GFR: 65.93 mL/min (ref >60.00)
Glucose, Bld: 99 mg/dL (ref 70–99)
Potassium: 4 meq/L (ref 3.5–5.1)
Sodium: 131 meq/L — ABNORMAL LOW (ref 135–145)
Total Bilirubin: 0.3 mg/dL (ref 0.2–1.2)
Total Protein: 6.7 g/dL (ref 6.0–8.3)

## 2024-07-19 ENCOUNTER — Ambulatory Visit: Payer: Self-pay | Admitting: Internal Medicine

## 2024-07-22 ENCOUNTER — Other Ambulatory Visit: Payer: Self-pay

## 2024-07-22 DIAGNOSIS — E782 Mixed hyperlipidemia: Secondary | ICD-10-CM

## 2024-07-22 MED ORDER — PRAVASTATIN SODIUM 20 MG PO TABS: 20.0000 mg | ORAL_TABLET | Freq: Every day | ORAL | 3 refills | Status: AC

## 2024-07-22 NOTE — Telephone Encounter (Signed)
 Refill has been sent Pt is aware

## 2024-07-31 ENCOUNTER — Telehealth: Payer: Self-pay | Admitting: Internal Medicine

## 2024-07-31 NOTE — Telephone Encounter (Signed)
 Inbound call from patient requesting a call back from the nurse to discuss diarrhea issues. Please advise.

## 2024-07-31 NOTE — Telephone Encounter (Signed)
 Spoke with patient. Patient reports she has had diarrhea intermittently x 3 weeks. Patient reports she will have regular formed BM's and then episodes of loose stool that is different from the diarrhea she has been seen for in the past. Patient states she has had x 2 episodes of soiling herself. Patient states when she was seeing Dr Eda she would give her antibiotics for these symptoms. Patient is currently taking Colestipl BID and this has been good for her. Patient only takes Metamucil at times. Patient reports starting a new anti-anxiety medication but states her diarrhea started prior to starting this. Appointment scheduled with Alan tomorrow, 08/01/24 @ 2:30 PM. Nat, CMA made aware of add on appointment.

## 2024-08-01 ENCOUNTER — Ambulatory Visit: Admitting: Physician Assistant

## 2024-08-01 ENCOUNTER — Other Ambulatory Visit

## 2024-08-01 ENCOUNTER — Ambulatory Visit
Admission: RE | Admit: 2024-08-01 | Discharge: 2024-08-01 | Disposition: A | Source: Ambulatory Visit | Attending: Physician Assistant | Admitting: Physician Assistant

## 2024-08-01 ENCOUNTER — Encounter: Payer: Self-pay | Admitting: Physician Assistant

## 2024-08-01 VITALS — BP 154/90 | HR 92 | Ht 61.0 in | Wt 134.1 lb

## 2024-08-01 DIAGNOSIS — K580 Irritable bowel syndrome with diarrhea: Secondary | ICD-10-CM

## 2024-08-01 DIAGNOSIS — R197 Diarrhea, unspecified: Secondary | ICD-10-CM

## 2024-08-01 DIAGNOSIS — Z860101 Personal history of adenomatous and serrated colon polyps: Secondary | ICD-10-CM | POA: Diagnosis not present

## 2024-08-01 DIAGNOSIS — K5790 Diverticulosis of intestine, part unspecified, without perforation or abscess without bleeding: Secondary | ICD-10-CM

## 2024-08-01 DIAGNOSIS — Z800 Family history of malignant neoplasm of digestive organs: Secondary | ICD-10-CM | POA: Diagnosis not present

## 2024-08-01 DIAGNOSIS — R195 Other fecal abnormalities: Secondary | ICD-10-CM | POA: Diagnosis not present

## 2024-08-01 LAB — COMPREHENSIVE METABOLIC PANEL WITH GFR
ALT: 12 U/L (ref 0–35)
AST: 17 U/L (ref 0–37)
Albumin: 4.4 g/dL (ref 3.5–5.2)
Alkaline Phosphatase: 63 U/L (ref 39–117)
BUN: 17 mg/dL (ref 6–23)
CO2: 29 meq/L (ref 19–32)
Calcium: 9.4 mg/dL (ref 8.4–10.5)
Chloride: 97 meq/L (ref 96–112)
Creatinine, Ser: 0.75 mg/dL (ref 0.40–1.20)
GFR: 77.68 mL/min (ref >60.00)
Glucose, Bld: 117 mg/dL — ABNORMAL HIGH (ref 70–99)
Potassium: 4.9 meq/L (ref 3.5–5.1)
Sodium: 132 meq/L — ABNORMAL LOW (ref 135–145)
Total Bilirubin: 0.3 mg/dL (ref 0.2–1.2)
Total Protein: 7 g/dL (ref 6.0–8.3)

## 2024-08-01 LAB — CBC WITH DIFFERENTIAL/PLATELET
Basophils Absolute: 0 K/uL (ref 0.0–0.1)
Basophils Relative: 0.6 % (ref 0.0–3.0)
Eosinophils Absolute: 0 K/uL (ref 0.0–0.7)
Eosinophils Relative: 0.5 % (ref 0.0–5.0)
HCT: 38.4 % (ref 36.0–46.0)
Hemoglobin: 12.6 g/dL (ref 12.0–15.0)
Lymphocytes Relative: 21.4 % (ref 12.0–46.0)
Lymphs Abs: 1.6 K/uL (ref 0.7–4.0)
MCHC: 32.9 g/dL (ref 30.0–36.0)
MCV: 87.4 fl (ref 78.0–100.0)
Monocytes Absolute: 0.5 K/uL (ref 0.1–1.0)
Monocytes Relative: 7.2 % (ref 3.0–12.0)
Neutro Abs: 5.2 K/uL (ref 1.4–7.7)
Neutrophils Relative %: 70.3 % (ref 43.0–77.0)
Platelets: 381 K/uL (ref 150.0–400.0)
RBC: 4.39 Mil/uL (ref 3.87–5.11)
RDW: 13 % (ref 11.5–15.5)
WBC: 7.3 K/uL (ref 4.0–10.5)

## 2024-08-01 LAB — SEDIMENTATION RATE: Sed Rate: 15 mm/h (ref 0–30)

## 2024-08-01 MED ORDER — METRONIDAZOLE 250 MG PO TABS: 250.0000 mg | ORAL_TABLET | Freq: Three times a day (TID) | ORAL | 0 refills | Status: AC

## 2024-08-01 NOTE — Patient Instructions (Addendum)
 _______________________________________________________  If your blood pressure at your visit was 140/90 or greater, please contact your primary care physician to follow up on this.  _______________________________________________________  If you are age 75 or older, your body mass index should be between 23-30. Your Body mass index is 25.34 kg/m. If this is out of the aforementioned range listed, please consider follow up with your Primary Care Provider.  If you are age 56 or younger, your body mass index should be between 19-25. Your Body mass index is 25.34 kg/m. If this is out of the aformentioned range listed, please consider follow up with your Primary Care Provider.   ________________________________________________________  The [facility] providers would like to encourage you to use MYCHART to communicate with providers for non-urgent requests or questions.  Due to long hold times on the telephone, sending your provider a message by [facility] may be a faster and more efficient way to get a response.  Please allow 48 business hours for a response.  Please remember that this is for non-urgent requests.  _______________________________________________________  [facility] is using a team-based approach to care.  Your team is made up of your doctor and two to three APPS. Our APPS (Nurse Practitioners and Physician Assistants) work with your physician to ensure care continuity for you. They are fully qualified to address your health concerns and develop a treatment plan. They communicate directly with your gastroenterologist to care for you. Seeing the Advanced Practice Practitioners on your physician's team can help you by facilitating care more promptly, often allowing for earlier appointments, access to diagnostic testing, procedures, and other specialty referrals.   Your provider has requested that you go to the basement level for lab work before leaving today. Press B on the  elevator. The lab is located at the first door on the left as you exit the elevator.  Your provider has requested that you have an abdominal x ray before leaving today. Please go to the basement floor to our Radiology department for the test.  We are ordering a Diatherix stool testing for you to take home and complete.  You have received a kit from our office today containing all necessary supplies to complete this test.  Please carefully read the stool collection instructions provided in the kit before opening the accompanying materials  OR an easier way is to please use toilet paper to wipe after your bowel movement and use the qtip/applicator provided to get a small volume of the stool from the toilet paper and place that in the tube.   Your provider has ordered Diatherix stool testing for you. You have received a kit from our office today containing all necessary supplies to complete this test. Please carefully read the stool collection instructions provided in the kit before opening the accompanying materials. In addition, be sure there is a label providing your full name and date of birth on the puritan opti-swab tube that is supplied in the kit (if you do not see a label with this information on your test tube, please make us  aware before test collection!). After completing the test, you should secure the purtian tube into the specimen biohazard bag. The [facility] E-Req sheet (including date and time of specimen collection) should be placed into the outside pocket of the specimen biohazard bag and returned to the [facility] lab (basement floor of [facility]) within 3 days of collection. Please make sure to give the specimen to a staff member at the lab.  DO NOT leave the specimen on the counter.   If the specimen date and time (can be found in the upper right boxed portion of the sheet) are not filled out on the E-Req sheet, the test will NOT be performed.    Important to remember: -Place the label onto the puritan opti-swab TUBE. -This label should include your full name and date of birth.  - This label should have the DATE AND TIME stool was collects -After completing the test, you should secure the tube into the specimen biohazard bag.  -The laboratory request information sheet (including date and time of specimen collection) should be placed into the outside pocket of the specimen biohazard bag and returned to the [facility] lab with 2 days of collection.   If it is greater than two days from collection you will be asked to repeat the test.  If the label is missing from the tube with your name, date of birth, date and time of collection on it, you will have to repeat the test.  Any questions please message us  on my chart or call the office at [phone]   Toileting tips to help with your constipation - Drink at least 64-80 ounces of water/liquid per day. - Establish a time to try to move your bowels every day.  For many people, this is after a cup of coffee or after a meal such as breakfast. - Sit all of the way back on the toilet keeping your back fairly straight and while sitting up, try to rest the tops of your forearms on your upper thighs.   - Raising your feet with a step stool/squatty potty can be helpful to improve the angle that allows your stool to pass through the rectum. - Relax the rectum feeling it bulge toward the toilet water.  If you feel your rectum raising toward your body, you are contracting rather than relaxing. - Breathe in and slowly exhale. Belly breath by expanding your belly towards your belly button. Keep belly expanded as you gently direct pressure down and back to the anus.  A low pitched GRRR sound can assist with increasing intra-abdominal pressure.  (Can also trying to blow on a pinwheel and make it move, this helps with the same belly breathing) - Repeat 3-4 times. If unsuccessful, contract the pelvic  floor to restore normal tone and get off the toilet.  Avoid excessive straining. - To reduce excessive wiping by teaching your anus to normally contract, place hands on outer aspect of knees and resist knee movement outward.  Hold 5-10 second then place hands just inside of knees and resist inward movement of knees.  Hold 5 seconds.  Repeat a few times each way.  Go to the ER if unable to pass gas, severe AB pain, unable to hold down food, any shortness of breath of chest pain.  Small intestinal bacterial overgrowth (SIBO) occurs when there is an abnormal increase in the overall bacterial population in the small intestine -- particularly types of bacteria not commonly found in that part of the digestive tract. Small intestinal bacterial overgrowth (SIBO) commonly results when a circumstance -- such as surgery or disease -- slows the passage of food and waste products in the digestive tract, creating a breeding ground for bacteria.  Signs and symptoms of SIBO often include: Loss of appetite Abdominal pain Nausea Bloating An uncomfortable feeling of fullness after eating Diarrhea or constipation, depending on the type of gas produced  What foods trigger SIBO?  While foods aren't the original cause of SIBO, certain foods do encourage the overgrowth of the wrong bacteria in your small intestine. If you're feeding them their favorite foods, they're going to grow more, and that will trigger more of your SIBO symptoms. By the same token, you can help reduce the overgrowth by starving the problematic bacteria of their favorite foods. This strategy has led to a number of proposed SIBO eating plans. The plans vary, and so do individual results. But in general, they tend to recommend limiting carbohydrates.  These include: Sugars and sweeteners. Fruits and starchy vegetables. Dairy products. Grains.  There is a test for this we can do called a breath test, if you are positive we will treat you with an  antibiotic to see if it helps.  Your symptoms are very suspicious for this condition, as discussed, we will start you on an antibiotic to see if this helps.

## 2024-08-01 NOTE — Progress Notes (Signed)
 08/01/2024 [name] [id number] 04-12-1951  Referring provider: Rollene Almarie LABOR, * Primary GI doctor: Dr. Federico  ASSESSMENT AND PLAN:  Diarrhea/IBS presents with 3 weeks of diarrhea 2021 colonoscopy negative microscopic colitis Normal sed rate CRP fecal calprotectin stool cultures Regular formed stools and intermittent loose stools with 2 episodes of fecal incontinence, loose versus slim/yellow stools Colestipol  and occ fiber, has been taking imodium  as needed which helps No anxiety medication - Ordered abdominal x-ray to assess for constipation. - Ordered CBC, kidney, liver function tests, and sed rate to evaluate infection and inflammation. - Ordered stool studies to rule out C. difficile and other bacterial infections. - Prescribed metronidazole  250 mg TID for 10 days for possible SIBO ( states has had previous with Dr. Eda) - Advised daily fiber intake to improve stool consistency. - Provided information on proper bowel positioning techniques. - Instructed to hold off on stool studies until after lab results are reviewed. - consider pelvic floor PT  Diverticulosis Will call if any symptoms. Add on fiber supplement, avoid NSAIDS, information given  Personal history of tubular adenomatous polyp (2016) Family history of colon cancer (maternal grandmother) no polyps on colonoscopy 10/2019 consider colonoscopy 2031 if clinically appropriate at that time  History of prolapse 2021 s/p hysterectomy with anterior/posterior repair 2022 s/p sacral colpopexy, posterior repair  Patient Care Team: Rollene Almarie LABOR, MD as PCP - General (Internal Medicine) Leva Rush, MD as Consulting Physician (Obstetrics and Gynecology) Arnaldo Juliene RAMAN, MD as Attending Physician (Family Medicine) Leslee Reusing, MD as Consulting Physician (Ophthalmology)  HISTORY OF PRESENT ILLNESS: 73 y.o. female with a past medical history listed below presents for evaluation of  diarrhea.   Last seen in the office 01/19/2023 by Dr. Federico for diarrhea which improved with colestipol  and Metamucil.  Discussed the use of AI scribe software for clinical note transcription with the patient, who gave verbal consent to proceed.  History of Present Illness   Amanda Curry is a 73 year old female who presents with diarrhea and bowel incontinence.  She has been experiencing diarrhea for the past three to four weeks, initially presenting as loose stools following a regular morning bowel movement. Over the past week, she experienced two episodes of bowel incontinence, both occurring in the morning, with stools described as slimy and containing yellow, seedy particles.  She typically has bowel movements only in the morning, with two to three episodes of loose stools following a regular stool. Recently, she had three loose stools after a regular one. She takes Imodium  AD as needed, particularly when she plans to go out, which she finds helpful.  Her current medications include colestipol  for cholesterol, Metamucil, and Florastor, which she started taking recently. She also takes buspirone  for anxiety, which she started before the onset of her current symptoms.  No recent antibiotic use, with the last being Keflex  in 2024. No new dietary changes have been made. No upper gastrointestinal symptoms such as nausea or weight loss. No fever, chills, abdominal pain, or discomfort, and there is no blood or dark stool noted.  Her past medical history includes a hysterectomy, oophorectomy, and anterior-posterior repair in 2021, followed by a sacrocolpopexy in 2022. She uses estradiol  cream and noted a possible low-grade temperature during the current episode.        She  reports that she has never smoked. She has never used smokeless tobacco. She reports that she does not currently use alcohol. She reports that she does not use  drugs.  RELEVANT GI HISTORY, IMAGING AND LABS: Results           CBC    Component Value Date/Time   WBC 10.2 07/03/2024 1105   RBC 4.40 07/03/2024 1105   HGB 13.1 07/03/2024 1105   HGB 12.5 07/14/2020 1046   HCT 38.7 07/03/2024 1105   HCT 37.4 07/14/2020 1046   PLT 390.0 07/03/2024 1105   PLT 377 07/14/2020 1046   MCV 88.0 07/03/2024 1105   MCV 90 07/14/2020 1046   MCH 30.1 07/14/2020 1046   MCH 31.0 01/27/2020 1806   MCHC 33.8 07/03/2024 1105   RDW 13.2 07/03/2024 1105   RDW 13.4 07/14/2020 1046   LYMPHSABS 1.3 07/14/2020 1046   MONOABS 0.8 01/27/2020 1806   EOSABS 0.1 07/14/2020 1046   BASOSABS 0.0 07/14/2020 1046   Recent Labs    07/03/24 1105  HGB 13.1    CMP     Component Value Date/Time   NA 131 (L) 07/18/2024 1418   NA 136 07/14/2020 1046   K 4.0 07/18/2024 1418   CL 97 07/18/2024 1418   CO2 29 07/18/2024 1418   GLUCOSE 99 07/18/2024 1418   BUN 19 07/18/2024 1418   BUN 18 07/14/2020 1046   CREATININE 0.86 07/18/2024 1418   CREATININE 0.58 10/22/2015 1158   CALCIUM  8.8 07/18/2024 1418   PROT 6.7 07/18/2024 1418   PROT 6.6 07/14/2020 1046   ALBUMIN 4.2 07/18/2024 1418   ALBUMIN 4.6 07/14/2020 1046   AST 18 07/18/2024 1418   ALT 14 07/18/2024 1418   ALKPHOS 56 07/18/2024 1418   BILITOT 0.3 07/18/2024 1418   BILITOT 0.3 07/14/2020 1046   GFRNONAA 64 07/14/2020 1046   GFRAA 73 07/14/2020 1046      Latest Ref Rng & Units 07/18/2024    2:18 PM 07/03/2024   11:05 AM 06/23/2023   11:34 AM  Hepatic Function  Total Protein 6.0 - 8.3 g/dL 6.7  7.3  7.0   Albumin 3.5 - 5.2 g/dL 4.2  4.5  4.3   AST 0 - 37 U/L 18  19  20    ALT 0 - 35 U/L 14  16  14    Alk Phosphatase 39 - 117 U/L 56  61  71   Total Bilirubin 0.2 - 1.2 mg/dL 0.3  0.4  0.4       Current Medications:    Current Outpatient Medications (Cardiovascular):    colestipol  (COLESTID ) 1 g tablet, TAKE 1 TABLET(1 GRAM) BY MOUTH TWICE DAILY   pravastatin  (PRAVACHOL ) 20 MG tablet, Take 1 tablet (20 mg total) by mouth daily.   spironolactone  (ALDACTONE ) 25 MG  tablet, Take 1 tablet (25 mg total) by mouth daily.  Current Outpatient Medications (Respiratory):    pseudoephedrine  (SUDAFED) 30 MG tablet, Take 30 mg by mouth every 4 (four) hours as needed for congestion.    Current Outpatient Medications (Other):    busPIRone  (BUSPAR ) 5 MG tablet, Take 1 tablet (5 mg total) by mouth 2 (two) times daily.   estradiol  (ESTRACE ) 0.1 MG/GM vaginal cream, Place 1 Applicatorful vaginally 3 (three) times a week.   hydrocortisone cream 1 %, Apply 1 application topically daily as needed for itching.    melatonin 5 MG TABS, Take 5 mg by mouth at bedtime.   metroNIDAZOLE  (FLAGYL ) 250 MG tablet, Take 1 tablet (250 mg total) by mouth 3 (three) times daily for 10 days.   psyllium (METAMUCIL) 58.6 % powder, Take 1 packet by mouth 3 (three) times daily.  simethicone (MYLICON) 80 MG chewable tablet, Chew 80 mg by mouth every 6 (six) hours as needed for flatulence.   triamcinolone  cream (KENALOG ) 0.1 %, Apply 1 Application topically daily as needed (eczema).  Medical History:  Past Medical History:  Diagnosis Date   Adenomatous polyps    Allergy    Anxiety    Arthritis    Diverticulosis    Ganglion of left wrist    thumb joint   GERD (gastroesophageal reflux disease)    Hyperlipidemia    Hypertension    Pre-diabetes    Tinnitus    Allergies:  Allergies  Allergen Reactions   Hctz [Hydrochlorothiazide ] Other (See Comments)    Causes extreme hyponatremia quickly - 126   Epinephrine  Other (See Comments)    Brief trembling sensation    Novocain [Procaine]     makes me tremble    Olmesartan     Amlodipine  Swelling    Mild but chronic lower extremity edema   Lidocaine -Epinephrine  (Pf) Anxiety   Other      Surgical History:  She  has a past surgical history that includes Tubal ligation; Tonsillectomy; Colonoscopy; Total knee arthroplasty (Right, 06/24/2019); Polypectomy; Joint replacement (Right, 05/2019); Laparoscopic vaginal hysterectomy with  salpingo oophorectomy (Bilateral, 01/20/2020); Anterior and posterior repair with sacrospinous fixation (N/A, 01/20/2020); Cystoscopy (N/A, 01/20/2020); Abdominal hysterectomy (N/A); and right trigger finger correction surgery. Family History:  Her family history includes Alzheimer's disease in her mother; Cancer in her father and maternal grandmother; Colon cancer in her maternal grandmother; Heart disease in her paternal grandfather and paternal grandmother; Hyperlipidemia in her mother; Hypertension in her mother and sister; Prostate cancer in her father.  REVIEW OF SYSTEMS  : All other systems reviewed and negative except where noted in the History of Present Illness.  PHYSICAL EXAM: BP (!) 154/90   Pulse 92   Ht 5' 1 (1.549 m)   Wt 134 lb 2 oz (60.8 kg)   BMI 25.34 kg/m  Physical Exam   GENERAL APPEARANCE: Well nourished, in no apparent distress. HEENT: No cervical lymphadenopathy, unremarkable thyroid , sclerae anicteric, conjunctiva pink. RESPIRATORY: Respiratory effort normal, BS equal bilateral without rales, rhonchi, wheezing. CARDIO: RRR with no MRGs, peripheral pulses intact. ABDOMEN: Soft, non distended, active bowel sounds in all 4 quadrants, no tenderness to palpation, no rebound, no mass appreciated, normal on auscultation. RECTAL: Declines. MUSCULOSKELETAL: Full ROM, normal gait, without edema. SKIN: Dry, intact without rashes or lesions. No jaundice. NEURO: Alert, oriented, no focal deficits. PSYCH: Cooperative, normal mood and affect.      Alan JONELLE Coombs, PA-C 3:04 PM

## 2024-08-02 ENCOUNTER — Ambulatory Visit: Payer: Self-pay | Admitting: Physician Assistant

## 2024-09-02 ENCOUNTER — Encounter: Payer: Self-pay | Admitting: Internal Medicine

## 2024-09-02 MED ORDER — FLUCONAZOLE 150 MG PO TABS: 150.0000 mg | ORAL_TABLET | Freq: Once | ORAL | 0 refills | Status: AC

## 2024-09-03 ENCOUNTER — Encounter: Payer: Self-pay | Admitting: Internal Medicine

## 2024-09-04 ENCOUNTER — Encounter: Payer: Self-pay | Admitting: Physician Assistant

## 2024-09-17 DIAGNOSIS — R197 Diarrhea, unspecified: Secondary | ICD-10-CM

## 2024-09-18 ENCOUNTER — Ambulatory Visit: Payer: Self-pay | Admitting: Physician Assistant

## 2024-09-18 ENCOUNTER — Ambulatory Visit (INDEPENDENT_AMBULATORY_CARE_PROVIDER_SITE_OTHER)
Admission: RE | Admit: 2024-09-18 | Discharge: 2024-09-18 | Disposition: A | Source: Ambulatory Visit | Attending: Physician Assistant | Admitting: Physician Assistant

## 2024-09-18 DIAGNOSIS — R197 Diarrhea, unspecified: Secondary | ICD-10-CM

## 2025-02-10 ENCOUNTER — Ambulatory Visit
